# Patient Record
Sex: Male | Born: 1960 | Race: Black or African American | Hispanic: No | Marital: Single | Smoking: Never smoker
Health system: Southern US, Community
[De-identification: ages and names within clinical notes are randomized; demographics above are authoritative.]

## PROBLEM LIST (undated history)

## (undated) DIAGNOSIS — E785 Hyperlipidemia, unspecified: Secondary | ICD-10-CM

## (undated) DIAGNOSIS — E119 Type 2 diabetes mellitus without complications: Secondary | ICD-10-CM

## (undated) DIAGNOSIS — G35 Multiple sclerosis: Secondary | ICD-10-CM

## (undated) DIAGNOSIS — C61 Malignant neoplasm of prostate: Secondary | ICD-10-CM

## (undated) HISTORY — PX: HERNIA REPAIR: SHX51

## (undated) HISTORY — PX: PROSTATE BIOPSY: SHX241

## (undated) HISTORY — PX: OTHER SURGICAL HISTORY: SHX169

---

## 1999-06-24 ENCOUNTER — Emergency Department (HOSPITAL_COMMUNITY): Admission: EM | Admit: 1999-06-24 | Discharge: 1999-06-24 | Payer: Self-pay | Admitting: Emergency Medicine

## 1999-06-24 ENCOUNTER — Encounter: Payer: Self-pay | Admitting: Emergency Medicine

## 1999-07-14 ENCOUNTER — Emergency Department (HOSPITAL_COMMUNITY): Admission: EM | Admit: 1999-07-14 | Discharge: 1999-07-14 | Payer: Self-pay | Admitting: Emergency Medicine

## 1999-10-13 ENCOUNTER — Emergency Department (HOSPITAL_COMMUNITY): Admission: EM | Admit: 1999-10-13 | Discharge: 1999-10-13 | Payer: Self-pay | Admitting: Emergency Medicine

## 1999-11-12 ENCOUNTER — Encounter: Payer: Self-pay | Admitting: Neurology

## 1999-11-12 ENCOUNTER — Ambulatory Visit (HOSPITAL_COMMUNITY): Admission: RE | Admit: 1999-11-12 | Discharge: 1999-11-12 | Payer: Self-pay | Admitting: Neurology

## 2004-08-24 ENCOUNTER — Emergency Department (HOSPITAL_COMMUNITY): Admission: EM | Admit: 2004-08-24 | Discharge: 2004-08-24 | Payer: Self-pay | Admitting: Emergency Medicine

## 2006-02-03 ENCOUNTER — Emergency Department (HOSPITAL_COMMUNITY): Admission: EM | Admit: 2006-02-03 | Discharge: 2006-02-03 | Payer: Self-pay | Admitting: Emergency Medicine

## 2007-05-12 ENCOUNTER — Emergency Department (HOSPITAL_COMMUNITY): Admission: EM | Admit: 2007-05-12 | Discharge: 2007-05-12 | Payer: Self-pay | Admitting: Emergency Medicine

## 2007-12-01 ENCOUNTER — Emergency Department (HOSPITAL_COMMUNITY): Admission: EM | Admit: 2007-12-01 | Discharge: 2007-12-01 | Payer: Self-pay | Admitting: Emergency Medicine

## 2007-12-07 ENCOUNTER — Emergency Department (HOSPITAL_COMMUNITY): Admission: EM | Admit: 2007-12-07 | Discharge: 2007-12-07 | Payer: Self-pay | Admitting: Emergency Medicine

## 2009-06-23 ENCOUNTER — Emergency Department (HOSPITAL_COMMUNITY): Admission: EM | Admit: 2009-06-23 | Discharge: 2009-06-24 | Payer: Self-pay | Admitting: Emergency Medicine

## 2009-08-21 ENCOUNTER — Emergency Department (HOSPITAL_COMMUNITY): Admission: EM | Admit: 2009-08-21 | Discharge: 2009-08-21 | Payer: Self-pay | Admitting: Emergency Medicine

## 2010-10-10 ENCOUNTER — Emergency Department (HOSPITAL_COMMUNITY): Payer: BC Managed Care – PPO

## 2010-10-10 ENCOUNTER — Emergency Department (HOSPITAL_COMMUNITY)
Admission: EM | Admit: 2010-10-10 | Discharge: 2010-10-10 | Disposition: A | Discharge status: Home / Self Care | Payer: BC Managed Care – PPO | Attending: Emergency Medicine | Admitting: Emergency Medicine

## 2010-10-10 DIAGNOSIS — R29898 Other symptoms and signs involving the musculoskeletal system: Secondary | ICD-10-CM | POA: Insufficient documentation

## 2010-10-10 DIAGNOSIS — G35 Multiple sclerosis: Secondary | ICD-10-CM | POA: Insufficient documentation

## 2010-10-10 DIAGNOSIS — R29810 Facial weakness: Secondary | ICD-10-CM | POA: Insufficient documentation

## 2010-10-10 DIAGNOSIS — H538 Other visual disturbances: Secondary | ICD-10-CM | POA: Insufficient documentation

## 2010-10-10 LAB — BASIC METABOLIC PANEL
CO2: 29 mEq/L (ref 19–32)
Calcium: 9.8 mg/dL (ref 8.4–10.5)
GFR calc Af Amer: >60 mL/min (ref >60)
GFR calc non Af Amer: >60 mL/min (ref >60)
Sodium: 139 mEq/L (ref 135–145)

## 2010-10-10 LAB — DIFFERENTIAL
Basophils Absolute: 0.1 10*3/uL (ref 0.0–0.1)
Basophils Relative: 1 % (ref 0–1)
Monocytes Absolute: 0.9 10*3/uL (ref 0.1–1.0)
Neutro Abs: 8.3 10*3/uL — ABNORMAL HIGH (ref 1.7–7.7)
Neutrophils Relative %: 69 % (ref 43–77)

## 2010-10-10 LAB — CBC
Hemoglobin: 15.6 g/dL (ref 13.0–17.0)
MCHC: 33.5 g/dL (ref 30.0–36.0)

## 2010-11-07 LAB — POCT I-STAT, CHEM 8
BUN: 11 mg/dL (ref 6–23)
Calcium, Ion: 1.2 mmol/L (ref 1.12–1.32)
Chloride: 102 mEq/L (ref 96–112)
Glucose, Bld: 115 mg/dL — ABNORMAL HIGH (ref 70–99)

## 2010-11-07 LAB — DIFFERENTIAL
Lymphocytes Relative: 21 % (ref 12–46)
Lymphs Abs: 2.5 10*3/uL (ref 0.7–4.0)
Monocytes Absolute: 0.8 10*3/uL (ref 0.1–1.0)
Monocytes Relative: 7 % (ref 3–12)
Neutro Abs: 8.5 10*3/uL — ABNORMAL HIGH (ref 1.7–7.7)

## 2010-11-07 LAB — CBC
Hemoglobin: 15.2 g/dL (ref 13.0–17.0)
MCHC: 33.3 g/dL (ref 30.0–36.0)
RBC: 5.18 MIL/uL (ref 4.22–5.81)

## 2012-12-03 ENCOUNTER — Telehealth: Payer: Self-pay | Admitting: Neurology

## 2012-12-03 NOTE — Telephone Encounter (Signed)
This is a duplicate call.  Please see previous note.

## 2013-03-05 HISTORY — PX: HERNIA REPAIR: SHX51

## 2013-04-21 ENCOUNTER — Telehealth: Payer: Self-pay | Admitting: Neurology

## 2013-05-03 ENCOUNTER — Emergency Department (HOSPITAL_COMMUNITY): Payer: BC Managed Care – PPO

## 2013-05-03 ENCOUNTER — Encounter (HOSPITAL_COMMUNITY): Payer: Self-pay | Admitting: Family Medicine

## 2013-05-03 ENCOUNTER — Emergency Department (HOSPITAL_COMMUNITY)
Admission: EM | Admit: 2013-05-03 | Discharge: 2013-05-03 | Disposition: A | Discharge status: Home / Self Care | Payer: BC Managed Care – PPO | Attending: Emergency Medicine | Admitting: Emergency Medicine

## 2013-05-03 DIAGNOSIS — R5381 Other malaise: Secondary | ICD-10-CM | POA: Insufficient documentation

## 2013-05-03 DIAGNOSIS — Q674 Other congenital deformities of skull, face and jaw: Secondary | ICD-10-CM | POA: Insufficient documentation

## 2013-05-03 DIAGNOSIS — H538 Other visual disturbances: Secondary | ICD-10-CM | POA: Insufficient documentation

## 2013-05-03 DIAGNOSIS — R51 Headache: Secondary | ICD-10-CM | POA: Insufficient documentation

## 2013-05-03 DIAGNOSIS — G35 Multiple sclerosis: Secondary | ICD-10-CM | POA: Insufficient documentation

## 2013-05-03 HISTORY — DX: Multiple sclerosis: G35

## 2013-05-03 LAB — CBC WITH DIFFERENTIAL/PLATELET
Basophils Absolute: 0.1 10*3/uL (ref 0.0–0.1)
Basophils Relative: 1 % (ref 0–1)
Eosinophils Relative: 2 % (ref 0–5)
HCT: 47.1 % (ref 39.0–52.0)
Lymphocytes Relative: 21 % (ref 12–46)
MCHC: 33.3 g/dL (ref 30.0–36.0)
MCV: 85.3 fL (ref 78.0–100.0)
Monocytes Absolute: 0.5 10*3/uL (ref 0.1–1.0)
Monocytes Relative: 7 % (ref 3–12)
RDW: 14.1 % (ref 11.5–15.5)

## 2013-05-03 LAB — BASIC METABOLIC PANEL
BUN: 11 mg/dL (ref 6–23)
CO2: 24 mEq/L (ref 19–32)
Calcium: 9.5 mg/dL (ref 8.4–10.5)
Creatinine, Ser: 0.95 mg/dL (ref 0.50–1.35)

## 2013-05-03 MED ORDER — GADOBENATE DIMEGLUMINE 529 MG/ML IV SOLN
20.0000 mL | Freq: Once | INTRAVENOUS | Status: AC
  Administered 2013-05-03: 20 mL via INTRAVENOUS

## 2013-05-03 MED ORDER — PREDNISONE 20 MG PO TABS: ORAL_TABLET | ORAL | Status: DC

## 2013-05-03 MED ORDER — METHYLPREDNISOLONE SODIUM SUCC 125 MG IJ SOLR
125.0000 mg | Freq: Once | INTRAMUSCULAR | Status: AC
  Administered 2013-05-03: 125 mg via INTRAVENOUS
  Filled 2013-05-03: qty 2

## 2013-05-03 NOTE — ED Notes (Signed)
Pt requested and was given a coke/cup/ice.

## 2013-05-03 NOTE — ED Notes (Signed)
Patient transported to MRI 

## 2013-05-03 NOTE — ED Notes (Signed)
Per pt sts for the past couple of months he has been having an MS flare up. sts weakness, blurry vision, HA. Sts he needs a referral For neuro doctor.

## 2013-05-03 NOTE — ED Provider Notes (Signed)
CSN: [id number]     Arrival date & time 05/03/13  9147 History   First MD Initiated Contact with Patient 05/03/13 [phone]     Chief Complaint  Patient presents with  . Multiple Sclerosis   (Consider location/radiation/quality/duration/timing/severity/associated sxs/prior Treatment) HPI Comments: Pt presents with 4 months of inc fatigue, stiffness, blurry vision, and intermittent h/a's, described as similar symptoms to prior flares.  He has been without his Rebif since May, has not seen neurologist for about 1.5 years due to payment issues stemming from MRI charges.  Blurry vision constant for months, has also been told he needs glasses.  Symptoms unchanged, without exacerbating or alleviating symptoms. Symptoms helped in past by steroids.    Past Medical History  Diagnosis Date  . MS (multiple sclerosis)    Past Surgical History  Procedure Laterality Date  . Hernia repair     History reviewed. No pertinent family history. History  Substance Use Topics  . Smoking status: Never Smoker   . Smokeless tobacco: Not on file  . Alcohol Use: Yes    Review of Systems  Constitutional: Positive for fatigue. Negative for fever, activity change and appetite change.  HENT: Negative for congestion, facial swelling, rhinorrhea and trouble swallowing.   Eyes: Negative for photophobia and pain.       Blurry vision  Respiratory: Negative for cough, chest tightness and shortness of breath.   Cardiovascular: Negative for chest pain and leg swelling.  Gastrointestinal: Negative for nausea, vomiting, abdominal pain, diarrhea and constipation.  Endocrine: Negative for polydipsia and polyuria.  Genitourinary: Negative for dysuria, urgency, decreased urine volume and difficulty urinating.  Musculoskeletal: Negative for back pain and gait problem.  Skin: Negative for color change, rash and wound.  Allergic/Immunologic: Negative for immunocompromised state.  Neurological: Positive for facial asymmetry and  headaches. Negative for dizziness, seizures, syncope, speech difficulty, weakness, light-headedness and numbness.  Psychiatric/Behavioral: Negative for confusion, decreased concentration and agitation.    Allergies  Review of patient's allergies indicates no known allergies.  Home Medications   Current Outpatient Rx  Name  Route  Sig  Dispense  Refill  . predniSONE (DELTASONE) 20 MG tablet      3 tabs po day one, then 2 tabs daily x 4 days   11 tablet   0    BP 128/82  Pulse 85  Temp(Src) 97.5 F (36.4 C) (Oral)  Resp 18  Ht 5\' 8"  (1.727 m)  Wt 205 lb (92.987 kg)  BMI 31.18 kg/m2  SpO2 97% Physical Exam  Constitutional: He is oriented to person, place, and time. He appears well-developed and well-nourished. No distress.  HENT:  Head: Normocephalic and atraumatic.  Mouth/Throat: No oropharyngeal exudate.  Eyes: Pupils are equal, round, and reactive to light.  Neck: Normal range of motion. Neck supple.  Cardiovascular: Normal rate, regular rhythm and normal heart sounds.  Exam reveals no gallop and no friction rub.   No murmur heard. Pulmonary/Chest: Effort normal and breath sounds normal. No respiratory distress. He has no wheezes. He has no rales.  Abdominal: Soft. Bowel sounds are normal. He exhibits no distension and no mass. There is no tenderness. There is no rebound and no guarding.  Musculoskeletal: Normal range of motion. He exhibits no edema and no tenderness.  Neurological: He is alert and oriented to person, place, and time. He displays no tremor. A cranial nerve deficit is present. No sensory deficit. He exhibits normal muscle tone. Coordination and gait normal.  Slight R sided facial droop  Skin: Skin is warm and dry.  Psychiatric: He has a normal mood and affect.    ED Course  Procedures (including critical care time) Labs Review Labs Reviewed  BASIC METABOLIC PANEL - Abnormal; Notable for the following:    Glucose, Bld 103 (*)    All other components  within normal limits  CBC WITH DIFFERENTIAL   Imaging Review Ct Head Wo Contrast  05/03/2013   CLINICAL DATA:  Blurry vision and headache; history of multiple sclerosis  EXAM: CT HEAD WITHOUT CONTRAST  TECHNIQUE: Contiguous axial images were obtained from the base of the skull through the vertex without intravenous contrast. Study was obtained within 24 hr of patient's arrival at the emergency department.  COMPARISON:  October 10, 2010  FINDINGS: Ventricles are normal in size and configuration. There is no demonstrable mass, hemorrhage, extra-axial fluid collection, or midline shift.  There is a new focus of decreased attenuation just inferior to the right the proximal middle cerebral artery at the junction of the right subfrontal and medial temporal lobes. This focal area of new decreased attenuation measures 1.5 x 1.0 cm. There is patchy small decreased attenuation throughout portions of the centra semiovale bilaterally which are stable. No new gray-white compartment lesions in these areas are seen. No other new gray-white compartment lesions are identified.  Bony calvarium appears intact. The mastoid air cells are clear.  IMPRESSION: New decreased attenuation at the right subfrontal -anterior temporal junction, consistent with either recent infarct or possibly new focus of demyelination given the history. The decreased attenuation in the periventricular small vessel disease is stable compared to prior study and may represent demyelination, small-vessel disease, or a combination of both. There is no well-defined hemorrhage or mass effect.   Electronically Signed   By: Bretta Bang   On: 05/03/2013 12:55   Mr Brain W Wo Contrast  05/03/2013   CLINICAL DATA:  Multiple sclerosis. Visual difficulties and dizziness. Right-sided weakness. Recent hernia surgery.  EXAM: MRI HEAD WITHOUT AND WITH CONTRAST  TECHNIQUE: Multiplanar, multiecho pulse sequences of the brain and surrounding structures were obtained  according to standard protocol without and with intravenous contrast  CONTRAST:  20mL MULTIHANCE GADOBENATE DIMEGLUMINE 529 MG/ML IV SOLN  COMPARISON:  CT without contrast 05/03/2013 and 10/09/2005.  FINDINGS: Extensive periventricular and subcortical T2 hyperintensities are consistent with the given diagnosis of multiple sclerosis. There is focal thinning of the posterior body of the corpus callosum. Much of the diffusion trace signal is secondary to T2 shine through. However, there is some restricted diffusion involving the splenium of the corpus callosum, worse on the left. No other restricted diffusion is evident. Decreased marrow signal is present in the upper cervical spine. Flow is present in the major intracranial arteries. The globes and orbits are intact. The paranasal sinuses and mastoid air cells are clear.  IMPRESSION: 1. Extensive periventricular and subcortical T2 hyperintensities in patterns consistent with the given diagnosis of multiple sclerosis. 2. Focal restricted diffusion within the splenium of the corpus callosum is concerning for active demyelination. 3. Based on prior CT exams, there is some progression of disease, certainly since 2006. 4. Decreased marrow signal within the upper cervical spine. This is nonspecific, as the patient does not appear to be anemic in prior laboratory testing. This could be related to prior therapy.   Electronically Signed   By: Gennette Pac   On: 05/03/2013 18:03    MDM   1. Multiple sclerosis exacerbation    Pt is a 52 y.o.  male with Pmhx as above who presents with concern for MS flare for about 4 months w/ inc fatigue, stiffness, blurry vision, and intermittent h/a's, described as similar symptoms to prior flares.  He has been without his Rebif since May, has not seen neurologist for about 1.5 years due to payment issues stemming from MRI charges.  Blurry vision constant for months, has also been told he needs glasses. On PE, VSS, pt in NAD.  He has  slight R sided facial droop which he says he has had before with MS flare.  Neuro exam otherwise unremarkable.   1:28 PM Have spoken to Neurology about pt's CT findings (New decreased attenuation at the right subfrontal -anterior temporal junction, consistent with either recent infarct or possibly new focus of demyelination given the history), they suggest MRI w/ contrast.   18:20 PM MRI with worsening/active disease.  Spoke with Dr. Thad Ranger w/ neurology who feels he would benefit from IV solumdrol in hospital.  Have ordered 125IV solumedrol.   18:30 PM Pt doesn't not want to stay in hospital as he is recently back to work and states he needs the income. I believe he has capacity to make this decision and will be safe for discharge.  Will d/c home w/ referral to [facility] neurology and PO prednisone.  Return precautions given for new or worsening symptoms   1. Multiple sclerosis exacerbation         Shanna Cisco, MD 05/04/13 1227

## 2013-05-04 ENCOUNTER — Telehealth (HOSPITAL_COMMUNITY): Payer: Self-pay | Admitting: Emergency Medicine

## 2013-05-04 NOTE — ED Notes (Signed)
Called [place] to see what they needed from [place] for pts referral to [facility] Neurology and they said they've taken care of it.

## 2013-05-04 NOTE — ED Notes (Signed)
Pt calling about referral to Neurology.  No note from ED MD (in EPIC or scanned) and no referral info listed on pts AVS in EPIC.  I informed pt I cant make referral without either MD note or AVS indicating it.  Pt verbalized understanding.

## 2013-05-12 ENCOUNTER — Encounter: Payer: Self-pay | Admitting: Neurology

## 2013-05-12 ENCOUNTER — Ambulatory Visit (INDEPENDENT_AMBULATORY_CARE_PROVIDER_SITE_OTHER): Payer: BC Managed Care – PPO | Admitting: Neurology

## 2013-05-12 VITALS — BP 118/80 | HR 60 | Temp 98.1°F | Resp 14 | Wt 205.0 lb

## 2013-05-12 DIAGNOSIS — G35 Multiple sclerosis: Secondary | ICD-10-CM

## 2013-05-12 NOTE — Patient Instructions (Signed)
Your MRI is scheduled at [facility] on Thursday, August 12, 2013 at 2:00 pm. Please check in at the first floor radiology department 15 minutes prior to your scheduled appointment time. Enter the hospital off of [address] at [address].     [phone].  Follow up with Dr. Everlena Cooper after the MRI (around the 15th of Jan.).  I will investigate getting your Rebif restarted.

## 2013-05-12 NOTE — Progress Notes (Signed)
Devon Barron  NEUROLOGY CONSULTATION NOTE  Devon Barron MRN: [id number] DOB: 01/17/1961  Referring provider: ED  Reason for consult:  Multiple sclerosis  HISTORY OF PRESENT ILLNESS: Devon Barron is a 52 year old right-handed man with possible OSA, who presents for multiple sclerosis.  Records and images were personally reviewed where available.   He was diagnosed with MS in 2001.  At that time, he presented with left facial myokymia.  He reports that he hasn't had many flare ups, and only a couple of flare-ups requiring pulse steroids.  They typically present as blurred vision, headache, dizziness, unsteadiness and fatigue.  He has both supratentorial and infratentorial demyelinating lesions.  His cervical cord is okay.  He has a history of lapses with physician follow-ups and non-adherence to disease-modifying agents, mostly due to financial reasons.  He previously was on Avonex, and then Rebif.  In the past, neutralizing antibodies have been negative.  Due to financial reasons, he has not followed up with a neurologist in 1.5 years and had not taken his Rebif since May.  Since May, he developed increased fatigue, stiffness, blurred vision and intermittent headaches, similar to prior flare ups.  He presented to the ED on 05/03/13.  Physical exam revealed slight right sided facial droop.  Initially, a CT of the head was performed and revealed decreased attenuation at the right subfrontal-anterior temporal junction.  MRI Brain w/wo contrast was performed, and revealed extensive periventricular and subcortical T2 hyperintensities, as well as focal restricted diffusion within the splenium of the corpus callosum, concerning for active demyelination.  It was recommended that he be admitted for IV steroids, but patient declined.  He was discharged on prednisone taper.  Since finishing the prednisone taper, he feels his walking has improved.  PAST MEDICAL HISTORY: Past Medical History  Diagnosis Date  . MS  (multiple sclerosis)     PAST SURGICAL HISTORY: Past Surgical History  Procedure Laterality Date  . Hernia repair      MEDICATIONS: Current Outpatient Prescriptions on File Prior to Visit  Medication Sig Dispense Refill  . predniSONE (DELTASONE) 20 MG tablet 3 tabs po day one, then 2 tabs daily x 4 days  11 tablet  0   No current facility-administered medications on file prior to visit.    ALLERGIES: No Known Allergies  FAMILY HISTORY: History reviewed. No pertinent family history.  SOCIAL HISTORY: History   Social History  . Marital Status: Single    Spouse Name: N/A    Number of Children: N/A  . Years of Education: N/A   Occupational History  . Not on file.   Social History Main Topics  . Smoking status: Never Smoker   . Smokeless tobacco: Not on file  . Alcohol Use: Yes     Comment: Occ  . Drug Use: Not on file  . Sexual Activity: Not on file   Other Topics Concern  . Not on file   Social History Narrative  . No narrative on file    REVIEW OF SYSTEMS: Constitutional: No fevers, chills, or sweats, no generalized fatigue, change in appetite Eyes: No visual changes, double vision, eye pain Ear, nose and throat: No hearing loss, ear pain, nasal congestion, sore throat Cardiovascular: No chest pain, palpitations Respiratory:  No shortness of breath at rest or with exertion, wheezes GastrointestinaI: No nausea, vomiting, diarrhea, abdominal pain, fecal incontinence Genitourinary:  No dysuria, urinary retention or frequency Musculoskeletal:  No neck pain, back pain Integumentary: No rash, pruritus, skin lesions Neurological: as  above Psychiatric: No depression, insomnia, anxiety Endocrine: No palpitations, fatigue, diaphoresis, mood swings, change in appetite, change in weight, increased thirst Hematologic/Lymphatic:  No anemia, purpura, petechiae. Allergic/Immunologic: no itchy/runny eyes, nasal congestion, recent allergic reactions, rashes  PHYSICAL  EXAM: Filed Vitals:   05/12/13 1354  BP: 118/80  Pulse: 60  Temp: 98.1 F (36.7 C)  Resp: 14   General: No acute distress Head:  Normocephalic/atraumatic Neck: supple, no paraspinal tenderness, full range of motion Back: No paraspinal tenderness Heart: regular rate and rhythm Lungs: Clear to auscultation bilaterally. Vascular: No carotid bruits. Neurological Exam: Mental status: alert and oriented to person, place, and time, speech fluent and not dysarthric, language intact. Cranial nerves: CN I: not tested CN II: pupils equal, round and reactive to light, visual fields intact, fundi unremarkable. CN III, IV, VI:  full range of motion, no nystagmus, no ptosis CN V: facial sensation intact CN VII: right lower facial weakness CN VIII: hearing intact CN IX, X: gag intact, uvula midline CN XI: sternocleidomastoid and trapezius muscles intact CN XII: tongue midline Bulk & Tone: normal, no fasciculations. Motor: 5-/5 left triceps, otherwise 5/5. Sensation: temperature and vibration intact. Deep Tendon Reflexes: 2+ throughout, toes down Finger to nose testing: normal Heel to shin: normal Gait: normal stride, able to walk on toes and heels.  Some mild difficulty with tandem. Romberg with sway.  IMPRESSION: Relapsing-remitting multiple sclerosis  PLAN: 1.  Will get him back on Rebif 2.  Will check vitamin D level 3.  Repeat MRI in 3 months with follow up soon after  Thank you for allowing me to take part in the care of this patient.  Shon Millet, DO

## 2013-05-13 ENCOUNTER — Telehealth: Payer: Self-pay | Admitting: Neurology

## 2013-05-13 LAB — VITAMIN D 25 HYDROXY (VIT D DEFICIENCY, FRACTURES): Vit D, 25-Hydroxy: 18 ng/mL — ABNORMAL LOW (ref 30–89)

## 2013-05-13 NOTE — Telephone Encounter (Signed)
Message copied by Benay Spice on Thu May 13, 2013  1:27 PM ------      Message from: JAFFE, ADAM R      Created: Thu May 13, 2013  5:52 AM       Mr. Denardo vitamin D level is low.  I recommend starting D3 4000 IU daily.  Also, it would be helpful to spend approximately 20-30 minutes in the sun daily.      ----- Message -----         From: [facility]         Sent: 05/13/2013   2:42 AM           To: Cira Servant, DO                   ------

## 2013-05-13 NOTE — Telephone Encounter (Signed)
Spoke with Hessie Diener. Information given as per Dr. Everlena Cooper. Will start the OTC Vit D3. Aware that I will fax in the script to [facility] for his Rebif. No additional questions/concerns at this time.

## 2013-07-13 ENCOUNTER — Encounter (HOSPITAL_COMMUNITY): Payer: Self-pay | Admitting: Emergency Medicine

## 2013-07-13 ENCOUNTER — Emergency Department (HOSPITAL_COMMUNITY): Payer: BC Managed Care – PPO

## 2013-07-13 ENCOUNTER — Other Ambulatory Visit: Payer: Self-pay

## 2013-07-13 ENCOUNTER — Emergency Department (HOSPITAL_COMMUNITY)
Admission: EM | Admit: 2013-07-13 | Discharge: 2013-07-14 | Disposition: A | Discharge status: Home / Self Care | Payer: BC Managed Care – PPO | Attending: Emergency Medicine | Admitting: Emergency Medicine

## 2013-07-13 DIAGNOSIS — F29 Unspecified psychosis not due to a substance or known physiological condition: Secondary | ICD-10-CM | POA: Insufficient documentation

## 2013-07-13 DIAGNOSIS — R51 Headache: Secondary | ICD-10-CM | POA: Insufficient documentation

## 2013-07-13 DIAGNOSIS — R42 Dizziness and giddiness: Secondary | ICD-10-CM | POA: Insufficient documentation

## 2013-07-13 DIAGNOSIS — H9209 Otalgia, unspecified ear: Secondary | ICD-10-CM | POA: Insufficient documentation

## 2013-07-13 DIAGNOSIS — R110 Nausea: Secondary | ICD-10-CM | POA: Insufficient documentation

## 2013-07-13 DIAGNOSIS — R197 Diarrhea, unspecified: Secondary | ICD-10-CM | POA: Insufficient documentation

## 2013-07-13 DIAGNOSIS — G35 Multiple sclerosis: Secondary | ICD-10-CM | POA: Insufficient documentation

## 2013-07-13 LAB — CBC WITH DIFFERENTIAL/PLATELET
Basophils Absolute: 0 10*3/uL (ref 0.0–0.1)
Basophils Relative: 0 % (ref 0–1)
Eosinophils Absolute: 0.1 10*3/uL (ref 0.0–0.7)
Eosinophils Relative: 1 % (ref 0–5)
HCT: 45.4 % (ref 39.0–52.0)
MCH: 28.6 pg (ref 26.0–34.0)
MCHC: 33.3 g/dL (ref 30.0–36.0)
Monocytes Absolute: 1.2 10*3/uL — ABNORMAL HIGH (ref 0.1–1.0)
Monocytes Relative: 15 % — ABNORMAL HIGH (ref 3–12)
Neutro Abs: 5 10*3/uL (ref 1.7–7.7)
RDW: 14.1 % (ref 11.5–15.5)

## 2013-07-13 LAB — COMPREHENSIVE METABOLIC PANEL
AST: 27 U/L (ref 0–37)
Albumin: 4.1 g/dL (ref 3.5–5.2)
BUN: 7 mg/dL (ref 6–23)
Calcium: 9.3 mg/dL (ref 8.4–10.5)
Chloride: 105 mEq/L (ref 96–112)
Creatinine, Ser: 1.05 mg/dL (ref 0.50–1.35)
Total Protein: 7.9 g/dL (ref 6.0–8.3)

## 2013-07-13 MED ORDER — GADOBENATE DIMEGLUMINE 529 MG/ML IV SOLN
20.0000 mL | Freq: Once | INTRAVENOUS | Status: AC
  Administered 2013-07-13: 20 mL via INTRAVENOUS

## 2013-07-13 NOTE — ED Notes (Signed)
Pt c/o bilateral ear pain and head congestion; pt c/o vague abd burning from nasal drainage; pt sts hx of MS but does not feel like flare; pt sts some dizziness

## 2013-07-13 NOTE — ED Notes (Addendum)
Pt states that he has been getting dizzy off and on since Monday night. Pt states that yesterday at work he became so dizzy that he became disoriented. Pt states for the past week his stomach has been feeling sour, he has had pressure in his ears and he has had a head cold. Pt states that he falls asleep often with his earphones in while listening to loud music and is concerned for an infection.

## 2013-07-13 NOTE — ED Notes (Signed)
Patient told nurse he just lost his mother last month and is now having to take time off of work that he really can't afford to. RN gave patient emotional support, therapeutic conversation and encouragement and offered to provide patient with a list of resources if he needed to speak with someone.

## 2013-07-13 NOTE — ED Provider Notes (Signed)
CSN: [id number]     Arrival date & time 07/13/13  1821 History   First MD Initiated Contact with Patient 07/13/13 2014     Chief Complaint  Patient presents with  . Nasal Congestion  . Otalgia    HPI  Devon Barron is a 52 y.o. male with a PMH of MS who presents to the ED for evaluation of multiple complaints.  History was provided by the patient.  Patient states that he has had bilateral otalgia, fatigue, sinus pressure, dizziness, nasal congestion, headache, and nausea off and on for the past week.  He describes his dizziness as the room spinning and denies lightheadedness.  He states he has been getting episodes intermittently sometimes with turning his head suddenly or when going from a sitting to standing position.  He also has dizziness at rest.  He had one episode of dizziness on Monday where he was unsteady on his feet and had confusion.  He has mild dizziness currently.  He has a mild occipital headache.  He states he has been falling asleep with his headphones at night this week, which he thinks is the cause of his problems.  He also complains of nausea with no abdominal pain.   He states he just has a "sour stomach."  He has had flare-ups with his MS in the past, but his symptoms today are not similar.  He denies any hx of vertigo diagnoses in the past.  He had a few episodes of diarrhea however this has resolved.  He denies any fevers, neck pain, chest pain, SOB, emesis, constipation, dysuria, weakness, loss of sensation, slurred speech, ongoing ataxia, confusion, or syncope.  No known sick contacts.  He is taking his Rebif with no missed doses.  His neurologist is Dr. Everlena Cooper with [facility] neurology.  He has a follow-up MRI scheduled for the beginning of January.      Past Medical History  Diagnosis Date  . MS (multiple sclerosis)    Past Surgical History  Procedure Laterality Date  . Hernia repair     Family History  Problem Relation Age of Onset  . Ataxia Neg Hx   . Chorea Neg  Hx   . Dementia Neg Hx   . Mental retardation Neg Hx   . Migraines Neg Hx   . Multiple sclerosis Neg Hx   . Neurofibromatosis Neg Hx   . Neuropathy Neg Hx   . Parkinsonism Neg Hx   . Seizures Neg Hx   . Stroke Neg Hx    History  Substance Use Topics  . Smoking status: Never Smoker   . Smokeless tobacco: Not on file  . Alcohol Use: Yes     Comment: Occ    Review of Systems  Constitutional: Positive for fatigue. Negative for fever, chills, diaphoresis, activity change and appetite change.  HENT: Positive for congestion, ear pain and sinus pressure. Negative for ear discharge, mouth sores, rhinorrhea and sore throat.   Eyes: Negative for photophobia and visual disturbance.  Respiratory: Negative for cough, shortness of breath and wheezing.   Cardiovascular: Negative for chest pain and leg swelling.  Gastrointestinal: Positive for nausea and diarrhea. Negative for vomiting and abdominal pain.  Genitourinary: Negative for dysuria.  Musculoskeletal: Negative for back pain, myalgias and neck pain.  Neurological: Positive for dizziness and headaches. Negative for syncope, speech difficulty, weakness, light-headedness and numbness.  Psychiatric/Behavioral: Positive for confusion.    Allergies  Review of patient's allergies indicates no known allergies.  Home Medications  Current Outpatient Rx  Name  Route  Sig  Dispense  Refill  . interferon beta-1a (REBIF) 44 MCG/0.5ML injection   Subcutaneous   Inject 44 mcg into the skin 3 (three) times a week.          BP 137/92  Pulse 110  Temp(Src) 97.9 F (36.6 C) (Oral)  Resp 18  SpO2 99%  Filed Vitals:   07/13/13 1838 07/13/13 2152 07/14/13 0324 07/14/13 0424  BP: 137/92 135/91 127/79 130/78  Pulse: 110 84 81 80  Temp: 97.9 F (36.6 C)     TempSrc: Oral     Resp: 18 20 19 16   SpO2: 99% 98% 99% 99%    Physical Exam  Nursing note and vitals reviewed. Constitutional: He is oriented to person, place, and time. He appears  well-developed and well-nourished. No distress.  HENT:  Head: Normocephalic and atraumatic.  Right Ear: External ear normal.  Left Ear: External ear normal.  Nose: Nose normal.  Mouth/Throat: Oropharynx is clear and moist. No oropharyngeal exudate.  No sinus or scalp tenderness.  TM's gray and translucent bilaterally  Eyes: Conjunctivae and EOM are normal. Pupils are equal, round, and reactive to light. Right eye exhibits no discharge. Left eye exhibits no discharge.  No nystagmus  Neck: Normal range of motion. Neck supple.  Dizziness not increased with head movement  Cardiovascular: Normal rate, regular rhythm, normal heart sounds and intact distal pulses.  Exam reveals no gallop and no friction rub.   No murmur heard. Dorsalis pedis pulses present and equal bilaterally  Pulmonary/Chest: Effort normal and breath sounds normal. No respiratory distress. He has no wheezes. He has no rales. He exhibits no tenderness.  Abdominal: Soft. Bowel sounds are normal. He exhibits no distension and no mass. There is no tenderness. There is no rebound and no guarding.  Musculoskeletal: Normal range of motion. He exhibits no edema and no tenderness.  Strength 5/5 in the upper and lower extremities bilaterally.  Patient able to ambulate without difficulty or ataxia.  Patient reports increased dizziness with going from a sitting to standing position.    Neurological: He is alert and oriented to person, place, and time.  GCS 15.  No focal neurological deficits.  Minimal left sided facial droop (baseline).  No pronator drift.  Finger to nose intact.  Heel to shin intact.    Skin: Skin is warm and dry. He is not diaphoretic.    ED Course  Procedures (including critical care time) Labs Review Labs Reviewed - No data to display Imaging Review No results found.  EKG Interpretation   None       Date: 07/14/2013  Rate: 76  Rhythm: normal sinus rhythm  QRS Axis: normal  Intervals: normal  ST/T Wave  abnormalities: normal  Conduction Disutrbances:none  Narrative Interpretation:   Old EKG Reviewed: none available       MR Brain W Wo Contrast (Final result)  Result time: 07/13/13 23:55:09    Final result by Rad Results In Interface (07/13/13 23:55:09)    Narrative:   CLINICAL DATA: Multiple sclerosis, nasal congestion and sinus pressure, fatigue, otalgia and dizziness. Mild occipital headache.  EXAM: MRI HEAD WITHOUT AND WITH CONTRAST  TECHNIQUE: Multiplanar, multiecho pulse sequences of the brain and surrounding structures were obtained without and with intravenous contrast.  CONTRAST: 20mL MULTIHANCE GADOBENATE DIMEGLUMINE 529 MG/ML IV SOLN  COMPARISON: MRI of the brain May 03, 2013  FINDINGS: At least 6 infratentorial and greater than 10 supratentorial ovoid T2 hyperintensities  many of which radiates from the right frontal periventricular margin most consistent with areas of demyelination, similar to prior examination. The ventricles and sulci are overall normal for patient's age. Confluent periventricular white matter FLAIR T2 hyperintensities extending the right parietal lobe, are similar. The lesions demonstrate low T1 signal consistent with "Black hole" of demyelination. Pulsation artifact to the temporal lobes, with faintly enhancing new left parietal 7 mm lesion, post gadolinium imaged 29/30 of 46. No reduced diffusion to suggest acute ischemia nor hyperacute demyelination. No susceptibility artifact to suggest hemorrhage.  No abnormal extra-axial fluid collections, leptomeningeal enhancement nor extra-axial masses. Normal major intracranial vascular flow voids seen at the skull base.  No abnormal sellar expansion. Trace maxillary mucosal thickening without paranasal sinus air-fluid levels, mastoid air cells appear well-aerated. No suspicious calvarial bone marrow signal. Craniocervical junction maintained.  IMPRESSION: Single new 7 mm left parietal  enhancing white matter lesion consistent with acute demyelination and disease progression in a background of severe chronic demyelination. No advanced parenchymal brain volume loss for age.   Electronically Signed By: Awilda Metro On: 07/13/2013 23:55         MDM   Devon Barron is a 52 y.o. male with a PMH of MS who presents to the ED for evaluation of multiple complaints  Rechecks  10:00 PM = Informed patient of plan per neurology. He is in agreement with MRI. 2:45 AM = spoke with patient about results of MRI scan. Patient states he's doing well with no dizziness. No other complaints states he is ready to be discharged.   Consults  9:45 PM = Spoke with Dr. Everlena Cooper with neurology who states that we can proceed with the MRI of the brain earlier than scheduled (early January) to determine the cause of his dizziness.    2:37 AM = Spoke with Dr. Everlena Cooper who states to start 1 gram solumedrol.     Patient evaluated in emergency department for multiple complaints. Patient complained of dizziness off and on during the week. Neurology was consulted who recommended MRI which was performed in emergency department. MRI was negative for any acute ischemia or hemorrhage however did show acute demyelination and progression of patient's multiple sclerosis. This may explain the patient's symptoms vs positional vertigo. Patient's dizziness resolved throughout his emergency room visit. Patient was able to ambulate without difficulty or ataxia. Patient had minimal left-sided facial drooping, which has been documented on previous neurology and ED notes.  Patient remained in no acute distress throughout his ED visit.  Patient will be contacted by neurology tomorrow for further evaluation and management.  Return precautions were discussed.       Discharge Medication List as of 07/14/2013  4:17 AM      Final impressions: 1. Multiple sclerosis   2. Dizziness      Luiz Iron PA-C   This  patient was discussed with Dr. Vinetta Bergamo, PA-C 07/14/13 [phone]

## 2013-07-14 ENCOUNTER — Telehealth: Payer: Self-pay | Admitting: Neurology

## 2013-07-14 ENCOUNTER — Other Ambulatory Visit: Payer: Self-pay | Admitting: Neurology

## 2013-07-14 ENCOUNTER — Other Ambulatory Visit (HOSPITAL_COMMUNITY): Payer: Self-pay | Admitting: *Deleted

## 2013-07-14 MED ORDER — SODIUM CHLORIDE 0.9 % IV SOLN
1000.0000 mg | INTRAVENOUS | Status: AC
  Administered 2013-07-14: 1000 mg via INTRAVENOUS
  Filled 2013-07-14 (×2): qty 8

## 2013-07-14 MED ORDER — METHYLPREDNISOLONE SODIUM SUCC 1000 MG IJ SOLR: INTRAMUSCULAR | Status: DC

## 2013-07-14 NOTE — ED Provider Notes (Signed)
  Medical screening examination/treatment/procedure(s) were performed by non-physician practitioner and as supervising physician I was immediately available for consultation/collaboration.  EKG Interpretation   None          Jahmier Willadsen, MD 07/14/13 1857 

## 2013-07-14 NOTE — Telephone Encounter (Signed)
Message copied by Benay Spice on Wed Jul 14, 2013  8:23 AM ------      Message from: JAFFE, ADAM R      Created: Wed Jul 14, 2013  6:10 AM       Mr. Voong, who has MS, was seen in the ED last night for dizziness.  MRI of brain with and without contrast was performed and revealed a single new 7 mm left parietal enhancing white matter lesion      consistent with acute demyelination.  He received 1gm Solumedrol earlier this morning.  I would like to schedule for him two more rounds of Solumedrol 1gm tomorrow morning (Thursday) and Friday morning.  I would like to add him at 11:30 tomorrow for follow up to discuss the dizziness. ------

## 2013-07-14 NOTE — Telephone Encounter (Signed)
Spoke with Hessie Diener. Aware of appointments at the [facility] Short Stay on 12/11 at 11:00 and 12/12 at 9:00. I will let him know about a f/u appointment.

## 2013-07-14 NOTE — Telephone Encounter (Signed)
Left a message for the patient to return my call re: IV infusions at [facility] Short Stay on 12/11 at 11:00am and on 12/12 at 9:00am.

## 2013-07-14 NOTE — Telephone Encounter (Signed)
Patient aware of f/u appointment with Dr. Everlena Cooper at noon on Friday.

## 2013-07-15 ENCOUNTER — Encounter (HOSPITAL_COMMUNITY)
Admission: RE | Admit: 2013-07-15 | Discharge: 2013-07-15 | Disposition: A | Discharge status: Home / Self Care | Payer: BC Managed Care – PPO | Source: Ambulatory Visit | Attending: Neurology | Admitting: Neurology

## 2013-07-15 DIAGNOSIS — G35 Multiple sclerosis: Secondary | ICD-10-CM | POA: Insufficient documentation

## 2013-07-15 MED ORDER — SODIUM CHLORIDE 0.9 % IV SOLN
1000.0000 mg | INTRAVENOUS | Status: DC
  Administered 2013-07-15: 1000 mg via INTRAVENOUS
  Filled 2013-07-15: qty 8

## 2013-07-16 ENCOUNTER — Encounter (HOSPITAL_COMMUNITY)
Admission: RE | Admit: 2013-07-16 | Discharge: 2013-07-16 | Disposition: A | Discharge status: Home / Self Care | Payer: BC Managed Care – PPO | Source: Ambulatory Visit | Attending: Neurology | Admitting: Neurology

## 2013-07-16 ENCOUNTER — Telehealth: Payer: Self-pay | Admitting: Neurology

## 2013-07-16 ENCOUNTER — Ambulatory Visit: Payer: BC Managed Care – PPO | Admitting: Neurology

## 2013-07-16 MED ORDER — SODIUM CHLORIDE 0.9 % IV SOLN
1000.0000 mg | INTRAVENOUS | Status: DC
  Administered 2013-07-16: 10:00:00 1000 mg via INTRAVENOUS
  Filled 2013-07-16: qty 8

## 2013-07-16 NOTE — Telephone Encounter (Signed)
[name] showed today's appt. No show letter sent / Roanna Raider

## 2013-07-23 ENCOUNTER — Encounter (HOSPITAL_COMMUNITY): Payer: Self-pay | Admitting: Emergency Medicine

## 2013-07-23 ENCOUNTER — Emergency Department (HOSPITAL_COMMUNITY): Payer: BC Managed Care – PPO

## 2013-07-23 ENCOUNTER — Emergency Department (HOSPITAL_COMMUNITY)
Admission: EM | Admit: 2013-07-23 | Discharge: 2013-07-23 | Disposition: A | Discharge status: Home / Self Care | Payer: BC Managed Care – PPO | Attending: Emergency Medicine | Admitting: Emergency Medicine

## 2013-07-23 DIAGNOSIS — R110 Nausea: Secondary | ICD-10-CM | POA: Insufficient documentation

## 2013-07-23 DIAGNOSIS — R42 Dizziness and giddiness: Secondary | ICD-10-CM | POA: Insufficient documentation

## 2013-07-23 DIAGNOSIS — Z79899 Other long term (current) drug therapy: Secondary | ICD-10-CM | POA: Insufficient documentation

## 2013-07-23 DIAGNOSIS — H8110 Benign paroxysmal vertigo, unspecified ear: Secondary | ICD-10-CM | POA: Insufficient documentation

## 2013-07-23 DIAGNOSIS — R0789 Other chest pain: Secondary | ICD-10-CM | POA: Insufficient documentation

## 2013-07-23 DIAGNOSIS — H9209 Otalgia, unspecified ear: Secondary | ICD-10-CM | POA: Insufficient documentation

## 2013-07-23 LAB — CBC WITH DIFFERENTIAL/PLATELET
Basophils Absolute: 0 10*3/uL (ref 0.0–0.1)
HCT: 44.5 % (ref 39.0–52.0)
Hemoglobin: 15.1 g/dL (ref 13.0–17.0)
Lymphocytes Relative: 9 % — ABNORMAL LOW (ref 12–46)
Lymphs Abs: 0.8 10*3/uL (ref 0.7–4.0)
Monocytes Absolute: 1.1 10*3/uL — ABNORMAL HIGH (ref 0.1–1.0)
Monocytes Relative: 13 % — ABNORMAL HIGH (ref 3–12)
Neutro Abs: 6.3 10*3/uL (ref 1.7–7.7)
Neutrophils Relative %: 77 % (ref 43–77)
RBC: 5.24 MIL/uL (ref 4.22–5.81)
RDW: 14 % (ref 11.5–15.5)
WBC: 8.1 10*3/uL (ref 4.0–10.5)

## 2013-07-23 LAB — COMPREHENSIVE METABOLIC PANEL
ALT: 23 U/L (ref 0–53)
AST: 16 U/L (ref 0–37)
CO2: 27 mEq/L (ref 19–32)
Chloride: 99 mEq/L (ref 96–112)
Creatinine, Ser: 0.79 mg/dL (ref 0.50–1.35)
GFR calc non Af Amer: >90 mL/min (ref >90)
Sodium: 136 mEq/L (ref 135–145)
Total Bilirubin: 0.5 mg/dL (ref 0.3–1.2)

## 2013-07-23 LAB — URINALYSIS, ROUTINE W REFLEX MICROSCOPIC
Bilirubin Urine: NEGATIVE
Hgb urine dipstick: NEGATIVE
Protein, ur: NEGATIVE mg/dL
Urobilinogen, UA: 0.2 mg/dL (ref 0.0–1.0)

## 2013-07-23 LAB — POCT I-STAT TROPONIN I: Troponin i, poc: 0 ng/mL (ref 0.00–0.08)

## 2013-07-23 MED ORDER — MECLIZINE HCL 50 MG PO TABS: 50.0000 mg | ORAL_TABLET | Freq: Three times a day (TID) | ORAL | Status: DC | PRN

## 2013-07-23 MED ORDER — ALPRAZOLAM 0.5 MG PO TABS: 0.5000 mg | ORAL_TABLET | Freq: Two times a day (BID) | ORAL | Status: DC | PRN

## 2013-07-23 MED ORDER — ALPRAZOLAM 0.25 MG PO TABS
0.5000 mg | ORAL_TABLET | Freq: Once | ORAL | Status: AC
  Administered 2013-07-23: 0.5 mg via ORAL
  Filled 2013-07-23: qty 2

## 2013-07-23 MED ORDER — MECLIZINE HCL 25 MG PO TABS
25.0000 mg | ORAL_TABLET | Freq: Once | ORAL | Status: AC
  Administered 2013-07-23: 25 mg via ORAL
  Filled 2013-07-23: qty 1

## 2013-07-23 NOTE — ED Notes (Signed)
Pt reports the dizziness has improved but is still present. Is worse when he stands up. Reports that room is still spinning but not as much.

## 2013-07-23 NOTE — ED Notes (Signed)
Walked patiet to bathroom patient was very unsteady on feet had to bring the strecher beside the bathroom cause patient was unable to walk back

## 2013-07-23 NOTE — ED Notes (Signed)
PA ordering xanax for dizziness. Pt's friend is driving him home.

## 2013-07-23 NOTE — ED Provider Notes (Signed)
CSN: [id number]     Arrival date & time 07/23/13  4098 History   First MD Initiated Contact with Patient 07/23/13 [phone]     Chief Complaint  Patient presents with  . Dizziness  . Chest Pain  . Otalgia   (Consider location/radiation/quality/duration/timing/severity/associated sxs/prior Treatment) HPI  52 year old male with history of multiple sclerosis and recently new lesion on brain MRI performed 9 days ago presents complaining of dizziness.  Pt report he was experiencing vertigo with room spinning sensation over a week ago when he was seen in ER and had brain MRI.  Has been receiving several courses of steroid for his MS, which was scheduled by his neurologist Dr. Assunta Found.  Sts has been improving but for the past 2 days his dizziness has returned.  Report room spinning sensation worsening with position changes, and feel unsteady on feet.  Report feeling nauseated last night and had mild chest congestion.  Chest congestion has improved but dizziness is still present, improves with laying still.  Denies any significant cardiac hx, a non smoker, and denies hearing changes, runny nose, sneeze, or cough.  Aside from steroid, no other treatment tried.  Denies fever, chills, productive cough, hemoptysis, abd pain, numbness or weakness.  Denies slurring of speech or difficulty talking.    Past Medical History  Diagnosis Date  . MS (multiple sclerosis)    Past Surgical History  Procedure Laterality Date  . Hernia repair     Family History  Problem Relation Age of Onset  . Ataxia Neg Hx   . Chorea Neg Hx   . Dementia Neg Hx   . Mental retardation Neg Hx   . Migraines Neg Hx   . Multiple sclerosis Neg Hx   . Neurofibromatosis Neg Hx   . Neuropathy Neg Hx   . Parkinsonism Neg Hx   . Seizures Neg Hx   . Stroke Neg Hx    History  Substance Use Topics  . Smoking status: Never Smoker   . Smokeless tobacco: Never Used  . Alcohol Use: Yes     Comment: Occ    Review of Systems  All other  systems reviewed and are negative.    Allergies  Review of patient's allergies indicates no known allergies.  Home Medications   Current Outpatient Rx  Name  Route  Sig  Dispense  Refill  . cholecalciferol (VITAMIN D) 1000 UNITS tablet   Oral   Take 1,000 Units by mouth daily.         . interferon beta-1a (REBIF) 44 MCG/0.5ML injection   Subcutaneous   Inject 44 mcg into the skin 3 (three) times a week.          BP 140/97  Pulse 79  Temp(Src) 98.1 F (36.7 C) (Oral)  Resp 11  SpO2 99% Physical Exam  Constitutional: He appears well-developed and well-nourished. No distress.  HENT:  Head: Atraumatic.  Right Ear: External ear normal.  Left Ear: External ear normal.  Eyes: Conjunctivae and EOM are normal. Pupils are equal, round, and reactive to light.  Horizontal nystagmus favoring L side.    Neck: Normal range of motion. Neck supple.  Cardiovascular: Normal rate and regular rhythm.   Pulmonary/Chest: Effort normal and breath sounds normal.  Abdominal: Soft. There is no tenderness.  Neurological: He is alert.  Speech clear, pupils equal round reactive to light, extraocular movements intact.  Horizontal nystagmus favoring L side Normal peripheral visual fields Cranial nerves III through XII normal including no facial droop Follows  commands, moves all extremities x4, normal strength to bilateral upper and lower extremities at all major muscle groups including grip Sensation normal to light touch and pinprick Poor coordination with finger/nose but normal heel/shin. Rapid alternating movements delay No pronator drift Gait unsteady requiring assist   Skin: No rash noted.  Psychiatric: He has a normal mood and affect.    ED Course  Procedures (including critical care time)   Date: 07/23/2013  Rate: 87  Rhythm: normal sinus rhythm  QRS Axis: normal  Intervals: normal  ST/T Wave abnormalities: normal  Conduction Disutrbances: none  Narrative Interpretation:    Old EKG Reviewed: No significant changes noted     10:17 AM Pt here with dizziness.  Hx of MS.  Has sxs suggestive of peripheral vertigo especially worsening with positional changes.  Recent MRI without evidence of acute stroke, but does show new demyelination consistent with MS.  i doubt new stroke today.  I suspect sxs 2/2 to MS.  However since pt has sxs also suggestive of BPPV, will give meclizine.  Check basic labs, r/o cardiac causes, will continue to monitor.  Plan to consult neurologist.    1:25 PM ECG, Trop and CXR unremarkable.  Doubt ACS.  Although pt felt a bit better after meclizine, he is unable to ambulate as it worsen his dizziness.  Will consult neurologist.  Care discussed with attending.    3:16 PM i have consulted with neurologist, Dr. Everlena Cooper who recommend pt f/u on Dec 24th for further evaluation.  Otherwise, will d/c pt with meclizine and pt can also try alprazolam for dizziness.  i suspect his sxs is likely BPPV and less likely MS exacerbation.  Will also give work note.  Return precaution discussed.    Labs Review Labs Reviewed  CBC WITH DIFFERENTIAL - Abnormal; Notable for the following:    Lymphocytes Relative 9 (*)    Monocytes Relative 13 (*)    Monocytes Absolute 1.1 (*)    All other components within normal limits  COMPREHENSIVE METABOLIC PANEL - Abnormal; Notable for the following:    Glucose, Bld 137 (*)    All other components within normal limits  GLUCOSE, CAPILLARY - Abnormal; Notable for the following:    Glucose-Capillary 104 (*)    All other components within normal limits  URINALYSIS, ROUTINE W REFLEX MICROSCOPIC  POCT I-STAT TROPONIN I   Imaging Review Dg Chest 2 View  07/23/2013   CLINICAL DATA:  Slight chest pain and difficulty breathing  EXAM: CHEST  2 VIEW  COMPARISON:  None.  FINDINGS: The heart size and mediastinal contours are within normal limits. Both lungs are clear. The visualized skeletal structures are unremarkable.  IMPRESSION: No  active cardiopulmonary disease.   Electronically Signed   By: Elige Ko   On: 07/23/2013 13:03    EKG Interpretation   None       MDM   1. Vertigo   2. BPPV (benign paroxysmal positional vertigo)    BP 135/100  Pulse 82  Temp(Src) 98.1 F (36.7 C) (Oral)  Resp 16  SpO2 99%  I have reviewed nursing notes and vital signs. I personally reviewed the imaging tests through PACS system  I reviewed available ER/hospitalization records thought the EMR     Fayrene Helper, [place] 07/23/13 1519

## 2013-07-23 NOTE — ED Notes (Signed)
Checked patient blood sugar it was 104 notified RN Sarah of blood sugar

## 2013-07-23 NOTE — ED Notes (Signed)
Pt from home via [facility] with c/o of chest pain on palpation, movement, and deep breath, right ear pain, and vertigo.  Pt ambulatory with assistance.  Pt was seen here for the same 2 days ago.  Pt in NAD, A&O.

## 2013-07-24 NOTE — ED Provider Notes (Signed)
Medical screening examination/treatment/procedure(s) were performed by non-physician practitioner and as supervising physician I was immediately available for consultation/collaboration.  EKG Interpretation    Date/Time:  Friday July 23 2013 09:24:20 EST Ventricular Rate:  87 PR Interval:  179 QRS Duration: 83 QT Interval:  363 QTC Calculation: 437 R Axis:   61 Text Interpretation:  Sinus rhythm Borderline T abnormalities, inferior leads Baseline wander in lead(s) V5 Confirmed by Rubin Payor  MD, Valoria Tamburri ([id number]) on 07/23/2013 11:49:04 AM             Juliet Rude. Rubin Payor, MD 07/24/13 [phone]

## 2013-07-28 ENCOUNTER — Encounter: Payer: Self-pay | Admitting: Neurology

## 2013-07-28 ENCOUNTER — Ambulatory Visit (INDEPENDENT_AMBULATORY_CARE_PROVIDER_SITE_OTHER): Payer: BC Managed Care – PPO | Admitting: Neurology

## 2013-07-28 ENCOUNTER — Other Ambulatory Visit: Payer: Self-pay | Admitting: Neurology

## 2013-07-28 VITALS — BP 128/88 | HR 96 | Temp 98.2°F | Ht 68.5 in | Wt 201.0 lb

## 2013-07-28 DIAGNOSIS — H8190 Unspecified disorder of vestibular function, unspecified ear: Secondary | ICD-10-CM

## 2013-07-28 DIAGNOSIS — G35D Multiple sclerosis, unspecified: Secondary | ICD-10-CM

## 2013-07-28 DIAGNOSIS — G35 Multiple sclerosis: Secondary | ICD-10-CM

## 2013-07-28 NOTE — Progress Notes (Signed)
NEUROLOGY FOLLOW UP OFFICE NOTE  ELDER DAVIDIAN [id number]  HISTORY OF PRESENT ILLNESS: Devon Barron is a 52 year old right-handed man with possible OSA, who follows up for multiple sclerosis and dizziness.  Records and images were personally reviewed where available.    UPDATE:  For the past 2 weeks, he has experienced 3 episodes of spinning sensation.  It can occur while sitting in the car, standing up or with sudden head movements.  It lasts several minutes.  There is no associated nausea.  He also has been experiencing generalized fatigue as well as right ear pain and head pressure.  No hearing loss or tinnitus.  He presented to the ED on 07/13/13 with these symptoms.  He was afebrile and bloodwork did not reveal sign of infection.  MRI of brain with and without contrast was performed, which revealed a single new 7mm left parietal enhancing white matter lesion, as well as progression of prior chronic demyelination.  He received a dose of Solumedrol 1gm for 3 days.  He returned to the ED the following week because he had another episode of spinning.  Meclizine wasn't effective but alprazolam was helpful.  He has been doing well overall.  He has been under a lot of stress and anxiety as his mother passed away last month.  So he feels he is more hypersensitive and worried about things.  He reports he has been compliant with the Rebif.  He was diagnosed with MS in 2001.  At that time, he presented with left facial myokymia. He reports that he hasn't had many flare ups, and only a couple of flare-ups requiring pulse steroids.  They typically present as blurred vision, headache, dizziness, unsteadiness and fatigue.  He has both supratentorial and infratentorial demyelinating lesions.  His cervical cord is okay.  He has a history of lapses with physician follow-ups and non-adherence to disease-modifying agents, mostly due to financial reasons.  He previously was on Avonex, and then Rebif.  In the past,  neutralizing antibodies have been negative.  Due to financial reasons, he has not followed up with a neurologist in 1.5 years and had not taken his Rebif since May.  Since May, he developed increased fatigue, stiffness, blurred vision and intermittent headaches, similar to prior flare ups.  He presented to the ED on 05/03/13.  Physical exam revealed slight right sided facial droop.  Initially, a CT of the head was performed and revealed decreased attenuation at the right subfrontal-anterior temporal junction.  MRI Brain w/wo contrast was performed, and revealed extensive periventricular and subcortical T2 hyperintensities, as well as focal restricted diffusion within the splenium of the corpus callosum, concerning for active demyelination.  It was recommended that he be admitted for IV steroids, but patient declined.  He was discharged on prednisone taper, which helped with his walking.  I first saw him in October 2014.  At that time, he exhibited right lower facial weakness, trace weakness in left tricep, mild difficulty with tandem walking and mild sway on Romberg.  We restarted his Rebif.  His vitamin D level was very low, at 18.  I recommended starting D3 4000 IU daily.  PAST MEDICAL HISTORY: Past Medical History  Diagnosis Date  . MS (multiple sclerosis)     MEDICATIONS: Current Outpatient Prescriptions on File Prior to Visit  Medication Sig Dispense Refill  . ALPRAZolam (XANAX) 0.5 MG tablet Take 1 tablet (0.5 mg total) by mouth 2 (two) times daily as needed (dizzy).  10  tablet  0  . cholecalciferol (VITAMIN D) 1000 UNITS tablet Take 1,000 Units by mouth daily.      . interferon beta-1a (REBIF) 44 MCG/0.5ML injection Inject 44 mcg into the skin 3 (three) times a week.      . meclizine (ANTIVERT) 50 MG tablet Take 1 tablet (50 mg total) by mouth 3 (three) times daily as needed for dizziness.  30 tablet  0   No current facility-administered medications on file prior to visit.    ALLERGIES: No  Known Allergies  FAMILY HISTORY: Family History  Problem Relation Age of Onset  . Ataxia Neg Hx   . Chorea Neg Hx   . Dementia Neg Hx   . Mental retardation Neg Hx   . Migraines Neg Hx   . Multiple sclerosis Neg Hx   . Neurofibromatosis Neg Hx   . Neuropathy Neg Hx   . Parkinsonism Neg Hx   . Seizures Neg Hx   . Stroke Neg Hx     SOCIAL HISTORY: History   Social History  . Marital Status: Single    Spouse Name: N/A    Number of Children: N/A  . Years of Education: N/A   Occupational History  . Not on file.   Social History Main Topics  . Smoking status: Never Smoker   . Smokeless tobacco: Never Used  . Alcohol Use: Yes     Comment: Occ  . Drug Use: No  . Sexual Activity: Not on file   Other Topics Concern  . Not on file   Social History Narrative  . No narrative on file    REVIEW OF SYSTEMS: Constitutional: No fevers, chills, or sweats, no generalized fatigue, change in appetite Eyes: No visual changes, double vision, eye pain Ear, nose and throat: No hearing loss, ear pain, nasal congestion, sore throat Cardiovascular: No chest pain, palpitations Respiratory:  No shortness of breath at rest or with exertion, wheezes GastrointestinaI: No nausea, vomiting, diarrhea, abdominal pain, fecal incontinence Genitourinary:  No dysuria, urinary retention or frequency Musculoskeletal:  No neck pain, back pain Integumentary: No rash, pruritus, skin lesions Neurological: as above Psychiatric: No depression, insomnia, anxiety Endocrine: No palpitations, fatigue, diaphoresis, mood swings, change in appetite, change in weight, increased thirst Hematologic/Lymphatic:  No anemia, purpura, petechiae. Allergic/Immunologic: no itchy/runny eyes, nasal congestion, recent allergic reactions, rashes  PHYSICAL EXAM: Filed Vitals:   07/28/13 1136  BP: 128/88  Pulse: 96  Temp: 98.2 F (36.8 C)   General: No acute distress Head:  Normocephalic/atraumatic Neck: supple, no  paraspinal tenderness, full range of motion Heart:  Regular rate and rhythm Lungs:  Clear to auscultation bilaterally Back: No paraspinal tenderness Neurological Exam: alert and oriented to person, place, and time. Speech fluent and not dysarthric, language intact.  Mild right facial weakness, otherwise CN II-XII intact. Fundoscopic exam unremarkable, no papilledema.  Bulk and tone normal, muscle strength 5/5 throughout.  Sensation to light touch, temperature and vibration intact.  Deep tendon reflexes 2+ throughout, toes downgoing.  Finger to nose and heel to shin testing intact.  Gait normal, Romberg negative.  IMPRESSION: Peripheral vestibulopathy, likely related to viral illness.  I don't think it is related to MS. Relapsing-remitting MS New small active demyelinating lesion on MRI, incidental finding which does not correlate with symptoms.  PLAN: 1.  May continue alprazolam but sparingly. 2.  Continue Rebif. 3.  Check interbetaferon neutralizing antibodies 4.  Vitamin D3 supplementation. 5.  Recheck MRI in 3 months. 6.  Follow up after  MRI 7.  Establish care with PCP  Shon Millet, DO          Check interbetaferon neutralizing antibodies Continue Rebif Recheck MRI of brain with and without contrast in 3 months.  If there are any new lesions, then would have to consider changing Rebif to another disease-modifying agent.

## 2013-07-28 NOTE — Patient Instructions (Addendum)
I think it is an inner ear problem.  I don't think it is related to the MS 1.  Use the alprazolam (small pill) sparingly (don't use often) 2.  Take it easy and don't make sudden movements of the head or body.  See how you do over the weekend and you can call the office early next week if needed. 3.  We will repeat MRI in 3 months.  Follow up soon after that. 4.  Refer you to establish care with a primary care doctor.

## 2013-08-12 ENCOUNTER — Ambulatory Visit (HOSPITAL_COMMUNITY): Payer: BC Managed Care – PPO

## 2013-08-17 ENCOUNTER — Ambulatory Visit: Payer: BC Managed Care – PPO | Admitting: Neurology

## 2013-08-31 ENCOUNTER — Ambulatory Visit: Payer: BC Managed Care – PPO | Admitting: Physician Assistant

## 2013-09-01 ENCOUNTER — Ambulatory Visit: Payer: BC Managed Care – PPO | Admitting: Internal Medicine

## 2013-09-03 ENCOUNTER — Ambulatory Visit (INDEPENDENT_AMBULATORY_CARE_PROVIDER_SITE_OTHER): Payer: BC Managed Care – PPO | Admitting: Internal Medicine

## 2013-09-03 ENCOUNTER — Other Ambulatory Visit (INDEPENDENT_AMBULATORY_CARE_PROVIDER_SITE_OTHER): Payer: BC Managed Care – PPO

## 2013-09-03 VITALS — BP 112/82 | HR 90 | Temp 98.0°F | Resp 16 | Ht 68.5 in | Wt 197.0 lb

## 2013-09-03 DIAGNOSIS — R7309 Other abnormal glucose: Secondary | ICD-10-CM

## 2013-09-03 DIAGNOSIS — R9431 Abnormal electrocardiogram [ECG] [EKG]: Secondary | ICD-10-CM

## 2013-09-03 DIAGNOSIS — R079 Chest pain, unspecified: Secondary | ICD-10-CM

## 2013-09-03 DIAGNOSIS — Z0000 Encounter for general adult medical examination without abnormal findings: Secondary | ICD-10-CM

## 2013-09-03 DIAGNOSIS — Z136 Encounter for screening for cardiovascular disorders: Secondary | ICD-10-CM

## 2013-09-03 DIAGNOSIS — Z23 Encounter for immunization: Secondary | ICD-10-CM

## 2013-09-03 LAB — URINALYSIS, ROUTINE W REFLEX MICROSCOPIC
Bilirubin Urine: NEGATIVE
Hgb urine dipstick: NEGATIVE
KETONES UR: NEGATIVE
Leukocytes, UA: NEGATIVE
Nitrite: NEGATIVE
Specific Gravity, Urine: 1.025 (ref 1.000–1.030)
Total Protein, Urine: NEGATIVE
URINE GLUCOSE: NEGATIVE
UROBILINOGEN UA: 1 (ref 0.0–1.0)
pH: 6 (ref 5.0–8.0)

## 2013-09-03 LAB — COMPREHENSIVE METABOLIC PANEL
ALT: 22 U/L (ref 0–53)
AST: 16 U/L (ref 0–37)
Albumin: 4.1 g/dL (ref 3.5–5.2)
Alkaline Phosphatase: 48 U/L (ref 39–117)
BILIRUBIN TOTAL: 0.7 mg/dL (ref 0.3–1.2)
BUN: 9 mg/dL (ref 6–23)
CALCIUM: 9.4 mg/dL (ref 8.4–10.5)
CHLORIDE: 104 meq/L (ref 96–112)
CO2: 28 mEq/L (ref 19–32)
Creatinine, Ser: 0.9 mg/dL (ref 0.4–1.5)
GFR: 110.89 mL/min (ref >60.00)
Glucose, Bld: 87 mg/dL (ref 70–99)
Potassium: 3.5 mEq/L (ref 3.5–5.1)
Sodium: 139 mEq/L (ref 135–145)
Total Protein: 7.2 g/dL (ref 6.0–8.3)

## 2013-09-03 LAB — CBC WITH DIFFERENTIAL/PLATELET
BASOS PCT: 0.4 % (ref 0.0–3.0)
Basophils Absolute: 0 10*3/uL (ref 0.0–0.1)
Eosinophils Absolute: 0 10*3/uL (ref 0.0–0.7)
Eosinophils Relative: 0.6 % (ref 0.0–5.0)
HEMATOCRIT: 44.2 % (ref 39.0–52.0)
Hemoglobin: 14.1 g/dL (ref 13.0–17.0)
LYMPHS ABS: 1.2 10*3/uL (ref 0.7–4.0)
Lymphocytes Relative: 22.3 % (ref 12.0–46.0)
MCHC: 32 g/dL (ref 30.0–36.0)
MCV: 88.7 fl (ref 78.0–100.0)
MONOS PCT: 14.6 % — AB (ref 3.0–12.0)
Monocytes Absolute: 0.8 10*3/uL (ref 0.1–1.0)
NEUTROS ABS: 3.2 10*3/uL (ref 1.4–7.7)
Neutrophils Relative %: 62.1 % (ref 43.0–77.0)
Platelets: 179 10*3/uL (ref 150.0–400.0)
RBC: 4.98 Mil/uL (ref 4.22–5.81)
RDW: 13.9 % (ref 11.5–14.6)
WBC: 5.2 10*3/uL (ref 4.5–10.5)

## 2013-09-03 LAB — PSA: PSA: 2.27 ng/mL (ref 0.10–4.00)

## 2013-09-03 LAB — HEMOGLOBIN A1C: HEMOGLOBIN A1C: 6.4 % (ref 4.6–6.5)

## 2013-09-03 LAB — LIPID PANEL
Cholesterol: 190 mg/dL (ref 0–200)
HDL: 49.6 mg/dL (ref >39.00)
LDL Cholesterol: 106 mg/dL — ABNORMAL HIGH (ref 0–99)
Total CHOL/HDL Ratio: 4
Triglycerides: 171 mg/dL — ABNORMAL HIGH (ref 0.0–149.0)
VLDL: 34.2 mg/dL (ref 0.0–40.0)

## 2013-09-03 LAB — CARDIAC PANEL
CK-MB: 1 ng/mL (ref 0.3–4.0)
Relative Index: 1.1 calc (ref 0.0–2.5)
Total CK: 90 U/L (ref 7–232)

## 2013-09-03 LAB — TSH: TSH: 1.04 u[IU]/mL (ref 0.35–5.50)

## 2013-09-03 LAB — FECAL OCCULT BLOOD, GUAIAC: FECAL OCCULT BLD: NEGATIVE

## 2013-09-03 LAB — TROPONIN I: TROPONIN I: 0.02 ng/mL (ref <0.06)

## 2013-09-03 LAB — HEPATITIS C ANTIBODY: HCV Ab: NEGATIVE

## 2013-09-03 NOTE — Progress Notes (Signed)
Subjective:    Patient ID: Devon Barron, male    DOB: 03-18-1961, 52 y.o.   MRN: [id number]  HPI Comments: New to me he complains of intermittent CP and dizziness for one month - he was seen in the ER and EKG showed NS ST/T wave changes.  Chest Pain  This is a recurrent problem. The current episode started more than 1 month ago. The onset quality is gradual. The problem occurs intermittently. The problem has been unchanged. The pain is present in the substernal region. The pain is at a severity of 1/10. The pain is mild. The quality of the pain is described as tightness. The pain does not radiate. Associated symptoms include dizziness. Pertinent negatives include no abdominal pain, back pain, claudication, cough, diaphoresis, exertional chest pressure, fever, headaches, hemoptysis, irregular heartbeat, leg pain, lower extremity edema, malaise/fatigue, nausea, near-syncope, numbness, orthopnea, palpitations, PND, shortness of breath, sputum production, syncope, vomiting or weakness. The pain is aggravated by nothing. He has tried nothing for the symptoms. Prior diagnostic workup includes chest x-ray.      Review of Systems  Constitutional: Negative.  Negative for fever, chills, malaise/fatigue, diaphoresis, appetite change and fatigue.  HENT: Negative.   Eyes: Negative.   Respiratory: Negative.  Negative for cough, hemoptysis, sputum production and shortness of breath.   Cardiovascular: Positive for chest pain. Negative for palpitations, orthopnea, claudication, syncope, PND and near-syncope.  Gastrointestinal: Negative.  Negative for nausea, vomiting, abdominal pain and constipation.  Endocrine: Negative.   Genitourinary: Negative.  Negative for dysuria, urgency, hematuria, difficulty urinating and testicular pain.  Musculoskeletal: Negative.  Negative for back pain.  Skin: Negative.   Allergic/Immunologic: Negative.   Neurological: Positive for dizziness. Negative for weakness, numbness  and headaches.  Hematological: Negative.  Negative for adenopathy. Does not bruise/bleed easily.  Psychiatric/Behavioral: Negative.        Objective:   Physical Exam  Vitals reviewed. Constitutional: He is oriented to person, place, and time. He appears well-developed and well-nourished. No distress.  HENT:  Head: Normocephalic and atraumatic.  Mouth/Throat: Oropharynx is clear and moist. No oropharyngeal exudate.  Eyes: Conjunctivae are normal. Right eye exhibits no discharge. Left eye exhibits no discharge. No scleral icterus.  Neck: Normal range of motion. Neck supple. No JVD present. No tracheal deviation present. No thyromegaly present.  Cardiovascular: Normal rate, regular rhythm, normal heart sounds and intact distal pulses.  Exam reveals no gallop and no friction rub.   No murmur heard. Pulmonary/Chest: Effort normal and breath sounds normal. No stridor. No respiratory distress. He has no wheezes. He has no rales. He exhibits no tenderness.  Abdominal: Soft. Bowel sounds are normal. He exhibits no distension and no mass. There is no tenderness. There is no rebound and no guarding. Hernia confirmed negative in the right inguinal area and confirmed negative in the left inguinal area.  Genitourinary: Rectum normal, testes normal and penis normal. Rectal exam shows no external hemorrhoid, no internal hemorrhoid, no fissure, no mass, no tenderness and anal tone normal. Guaiac negative stool. Prostate is enlarged (1+ smooth symm BPH). Prostate is not tender. Right testis shows no mass, no swelling and no tenderness. Right testis is descended. Left testis shows no mass, no swelling and no tenderness. Left testis is descended. Uncircumcised. No phimosis, paraphimosis, hypospadias, penile erythema or penile tenderness. No discharge found.  Musculoskeletal: Normal range of motion. He exhibits no edema and no tenderness.  Lymphadenopathy:    He has no cervical adenopathy.  Right: No inguinal  adenopathy present.       Left: No inguinal adenopathy present.  Neurological: He is oriented to person, place, and time.  Skin: Skin is warm and dry. No rash noted. He is not diaphoretic. No erythema. No pallor.  Psychiatric: He has a normal mood and affect. His behavior is normal. Judgment and thought content normal.     Lab Results  Component Value Date   WBC 8.1 07/23/2013   HGB 15.1 07/23/2013   HCT 44.5 07/23/2013   PLT 208 07/23/2013   GLUCOSE 137* 07/23/2013   ALT 23 07/23/2013   AST 16 07/23/2013   NA 136 07/23/2013   K 3.8 07/23/2013   CL 99 07/23/2013   CREATININE 0.79 07/23/2013   BUN 7 07/23/2013   CO2 27 07/23/2013       Assessment & Plan:

## 2013-09-03 NOTE — Progress Notes (Signed)
Pre visit review using our clinic review tool, if applicable. No additional management support is needed unless otherwise documented below in the visit note. 

## 2013-09-03 NOTE — Patient Instructions (Signed)
Chest Pain (Nonspecific) It is often hard to give a specific diagnosis for the cause of chest pain. There is always a chance that your pain could be related to something serious, such as a heart attack or a blood clot in the lungs. You need to follow up with your caregiver for further evaluation. CAUSES   Heartburn.  Pneumonia or bronchitis.  Anxiety or stress.  Inflammation around your heart (pericarditis) or lung (pleuritis or pleurisy).  A blood clot in the lung.  A collapsed lung (pneumothorax). It can develop suddenly on its own (spontaneous pneumothorax) or from injury (trauma) to the chest.  Shingles infection (herpes zoster virus). The chest wall is composed of bones, muscles, and cartilage. Any of these can be the source of the pain.  The bones can be bruised by injury.  The muscles or cartilage can be strained by coughing or overwork.  The cartilage can be affected by inflammation and become sore (costochondritis). DIAGNOSIS  Lab tests or other studies, such as X-rays, electrocardiography, stress testing, or cardiac imaging, may be needed to find the cause of your pain.  TREATMENT   Treatment depends on what may be causing your chest pain. Treatment may include:  Acid blockers for heartburn.  Anti-inflammatory medicine.  Pain medicine for inflammatory conditions.  Antibiotics if an infection is present.  You may be advised to change lifestyle habits. This includes stopping smoking and avoiding alcohol, caffeine, and chocolate.  You may be advised to keep your head raised (elevated) when sleeping. This reduces the chance of acid going backward from your stomach into your esophagus.  Most of the time, nonspecific chest pain will improve within 2 to 3 days with rest and mild pain medicine. HOME CARE INSTRUCTIONS   If antibiotics were prescribed, take your antibiotics as directed. Finish them even if you start to feel better.  For the next few days, avoid physical  activities that bring on chest pain. Continue physical activities as directed.  Do not smoke.  Avoid drinking alcohol.  Only take over-the-counter or prescription medicine for pain, discomfort, or fever as directed by your caregiver.  Follow your caregiver's suggestions for further testing if your chest pain does not go away.  Keep any follow-up appointments you made. If you do not go to an appointment, you could develop lasting (chronic) problems with pain. If there is any problem keeping an appointment, you must call to reschedule. SEEK MEDICAL CARE IF:   You think you are having problems from the medicine you are taking. Read your medicine instructions carefully.  Your chest pain does not go away, even after treatment.  You develop a rash with blisters on your chest. SEEK IMMEDIATE MEDICAL CARE IF:   You have increased chest pain or pain that spreads to your arm, neck, jaw, back, or abdomen.  You develop shortness of breath, an increasing cough, or you are coughing up blood.  You have severe back or abdominal pain, feel nauseous, or vomit.  You develop severe weakness, fainting, or chills.  You have a fever. THIS IS AN EMERGENCY. Do not wait to see if the pain will go away. Get medical help at once. Call your local emergency services (911 in [place] Do not drive yourself to the hospital. MAKE SURE YOU:   Understand these instructions.  Will watch your condition.  Will get help right away if you are not doing well or get worse. Document Released: 05/01/2005 Document Revised: 10/14/2011 Document Reviewed: 02/25/2008 [facility] Patient Information 2014 [place],  [employer]. Health Maintenance, Males A healthy lifestyle and preventative care can promote health and wellness.  Maintain regular health, dental, and eye exams.  Eat a healthy diet. Foods like vegetables, fruits, whole grains, low-fat dairy products, and lean protein foods contain the nutrients you need and are low in  calories. Decrease your intake of foods high in solid fats, added sugars, and salt. Get information about a proper diet from your health care provider, if necessary.  Regular physical exercise is one of the most important things you can do for your health. Most adults should get at least 150 minutes of moderate-intensity exercise (any activity that increases your heart rate and causes you to sweat) each week. In addition, most adults need muscle-strengthening exercises on 2 or more days a week.   Maintain a healthy weight. The body mass index (BMI) is a screening tool to identify possible weight problems. It provides an estimate of body fat based on height and weight. Your health care provider can find your BMI and can help you achieve or maintain a healthy weight. For males 20 years and older:  A BMI below 18.5 is considered underweight.  A BMI of 18.5 to 24.9 is normal.  A BMI of 25 to 29.9 is considered overweight.  A BMI of 30 and above is considered obese.  Maintain normal blood lipids and cholesterol by exercising and minimizing your intake of saturated fat. Eat a balanced diet with plenty of fruits and vegetables. Blood tests for lipids and cholesterol should begin at age 20 and be repeated every 5 years. If your lipid or cholesterol levels are high, you are over 50, or you are at high risk for heart disease, you may need your cholesterol levels checked more frequently.Ongoing high lipid and cholesterol levels should be treated with medicines, if diet and exercise are not working.  If you smoke, find out from your health care provider how to quit. If you do not use tobacco, do not start.  Lung cancer screening is recommended for adults aged 55 80 years who are at high risk for developing lung cancer because of a history of smoking. A yearly low-dose CT scan of the lungs is recommended for people who have at least a 30-pack-year history of smoking and are a current smoker or have quit  within the past 15 years. A pack year of smoking is smoking an average of 1 pack of cigarettes a day for 1 year (for example, a 30-pack-year history of smoking could mean smoking 1 pack a day for 30 years or 2 packs a day for 15 years). Yearly screening should continue until the smoker has stopped smoking for at least 15 years. Yearly screening should be stopped for people who develop a health problem that would prevent them from having lung cancer treatment.  If you choose to drink alcohol, do not have more than 2 drinks per day. One drink is considered to be 12 oz (360 mL) of beer, 5 oz (150 mL) of wine, or 1.5 oz (45 mL) of liquor.  Avoid use of street drugs. Do not share needles with anyone. Ask for help if you need support or instructions about stopping the use of drugs.  High blood pressure causes heart disease and increases the risk of stroke. Blood pressure should be checked at least every 1 2 years. Ongoing high blood pressure should be treated with medicines if weight loss and exercise are not effective.  If you are 45 53 years old, ask   your health care provider if you should take aspirin to prevent heart disease.  Diabetes screening involves taking a blood sample to check your fasting blood sugar level. This should be done once every 3 years after age 22, if you are at a normal weight and without risk factors for diabetes. Testing should be considered at a younger age or be carried out more frequently if you are overweight and have at least 1 risk factor for diabetes.  Colorectal cancer can be detected and often prevented. Most routine colorectal cancer screening begins at the age of 35 and continues through age 34. However, your health care provider may recommend screening at an earlier age if you have risk factors for colon cancer. On a yearly basis, your health care provider may provide home test kits to check for hidden blood in the stool. A small camera at the end of a tube may be used to  directly examine the colon (sigmoidoscopy or colonoscopy) to detect the earliest forms of colorectal cancer. Talk to your health care provider about this at age 67, when routine screening begins. A direct exam of the colon should be repeated every 5 10 years through age 43, unless early forms of pre-cancerous polyps or small growths are found.  People who are at an increased risk for hepatitis B should be screened for this virus. You are considered at high risk for hepatitis B if:  You were born in a country where hepatitis B occurs often. Talk with your health care provider about which countries are considered high-risk.  Your parents were born in a high-risk country and you have not received a shot to protect against hepatitis B (hepatitis B vaccine).  You have HIV or AIDS.  You use needles to inject street drugs.  You live with, or have sex with, someone who has hepatitis B.  You are a man who has sex with other men (MSM).  You get hemodialysis treatment.  You take certain medicines for conditions like cancer, organ transplantation, and autoimmune conditions.  Hepatitis C blood testing is recommended for all people born from [birth year] through 1965 and any individual with known risk factors for hepatitis C.  Healthy men should no longer receive prostate-specific antigen (PSA) blood tests as part of routine cancer screening. Talk to your health care provider about prostate cancer screening.  Testicular cancer screening is not recommended for adolescents or adult males who have no symptoms. Screening includes self-exam, a health care provider exam, and other screening tests. Consult with your health care provider about any symptoms you have or any concerns you have about testicular cancer.  Practice safe sex. Use condoms and avoid high-risk sexual practices to reduce the spread of sexually transmitted infections (STIs).  Use sunscreen. Apply sunscreen liberally and repeatedly throughout the  day. You should seek shade when your shadow is shorter than you. Protect yourself by wearing long sleeves, pants, a wide-brimmed hat, and sunglasses year round, whenever you are outdoors.  Tell your health care provider of new moles or changes in moles, especially if there is a change in shape or color. Also tell your provider if a mole is larger than the size of a pencil eraser.  A one-time screening for abdominal aortic aneurysm (AAA) and surgical repair of large AAAs by ultrasound is recommended for men aged 19 75 years who are current or former smokers.  Stay current with your vaccines (immunizations). Document Released: 01/18/2008 Document Revised: 05/12/2013 Document Reviewed: 12/17/2010 [employer] Patient Information 2014  [employer].

## 2013-09-05 ENCOUNTER — Encounter: Payer: Self-pay | Admitting: Internal Medicine

## 2013-09-05 NOTE — Assessment & Plan Note (Signed)
His CP is atypical, his EKG today shows a lot of artifact despite several attempts but there is persistent NS ST/T wave changes. I will check his cardiac enzymes today and if they are positive will advise further. He has risk factors so I have asked him to have an MPI done to screen for CAD.

## 2013-09-05 NOTE — Assessment & Plan Note (Signed)
Exam done Vaccines were updated He was referred for a colonoscopy Labs ordered Pt ed material was given 

## 2013-09-05 NOTE — Assessment & Plan Note (Signed)
He has pre-diabetes 

## 2013-09-06 ENCOUNTER — Telehealth: Payer: Self-pay | Admitting: *Deleted

## 2013-09-06 NOTE — Telephone Encounter (Signed)
To[facility]hel[facility] Flake Fax: [phone] From: [facility] Date/ Time: 09/03/2013 7:22 PM Taken By: Jackelyn Knife, CSR Caller: [place]: Randell Loop Patient: Devon Barron, Devon Barron DOB: 1960-11-27 Phone: [phone] Reason for Call: Janett Billow is calling from [place] regarding a Triponin I ordered on Valda Lamb by Marcello Moores Jones1/30/2015 4:12:00 PM. The results were 0.02, no indication of myocardial injury. Regarding Appointment: Appt Date: Appt Time: Unknown Provider: Reason: Details:

## 2013-09-08 ENCOUNTER — Telehealth: Payer: Self-pay | Admitting: Neurology

## 2013-09-08 ENCOUNTER — Telehealth: Payer: Self-pay | Admitting: *Deleted

## 2013-09-08 NOTE — Telephone Encounter (Signed)
Wants work note to limit work day to 6 hours per day for February d/t MS fatigue. Patient asks that we NOT mention MS on the note. Please call [phone] / Sherri S.

## 2013-09-08 NOTE — Telephone Encounter (Signed)
I won't be able to draft a letter like that.  I typically do not write work-excuse letters for symptoms of fatigue.

## 2013-09-08 NOTE — Telephone Encounter (Signed)
I spoke again with patient to reinforce that to write an excuse to cut back his hours of work for being tired and or fatigue without the mention of MS is not a standard practices to do at this time .

## 2013-09-08 NOTE — Telephone Encounter (Signed)
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

## 2013-09-17 ENCOUNTER — Ambulatory Visit (INDEPENDENT_AMBULATORY_CARE_PROVIDER_SITE_OTHER): Payer: BC Managed Care – PPO | Admitting: Internal Medicine

## 2013-09-17 ENCOUNTER — Encounter: Payer: Self-pay | Admitting: Internal Medicine

## 2013-09-17 VITALS — BP 120/74 | HR 80 | Temp 98.1°F | Resp 16 | Ht 68.5 in | Wt 201.0 lb

## 2013-09-17 DIAGNOSIS — K219 Gastro-esophageal reflux disease without esophagitis: Secondary | ICD-10-CM | POA: Insufficient documentation

## 2013-09-17 DIAGNOSIS — R9431 Abnormal electrocardiogram [ECG] [EKG]: Secondary | ICD-10-CM

## 2013-09-17 DIAGNOSIS — R079 Chest pain, unspecified: Secondary | ICD-10-CM

## 2013-09-17 DIAGNOSIS — M5412 Radiculopathy, cervical region: Secondary | ICD-10-CM | POA: Insufficient documentation

## 2013-09-17 MED ORDER — DEXLANSOPRAZOLE 60 MG PO CPDR
60.0000 mg | DELAYED_RELEASE_CAPSULE | Freq: Every day | ORAL | Status: DC
Start: 2013-09-17 — End: 2014-02-15

## 2013-09-17 NOTE — Patient Instructions (Signed)
Gastroesophageal Reflux Disease, Adult  Gastroesophageal reflux disease (GERD) happens when acid from your stomach flows up into the esophagus. When acid comes in contact with the esophagus, the acid causes soreness (inflammation) in the esophagus. Over time, GERD may create small holes (ulcers) in the lining of the esophagus.  CAUSES   · Increased body weight. This puts pressure on the stomach, making acid rise from the stomach into the esophagus.  · Smoking. This increases acid production in the stomach.  · Drinking alcohol. This causes decreased pressure in the lower esophageal sphincter (valve or ring of muscle between the esophagus and stomach), allowing acid from the stomach into the esophagus.  · Late evening meals and a full stomach. This increases pressure and acid production in the stomach.  · A malformed lower esophageal sphincter.  Sometimes, no cause is found.  SYMPTOMS   · Burning pain in the lower part of the mid-chest behind the breastbone and in the mid-stomach area. This may occur twice a week or more often.  · Trouble swallowing.  · Sore throat.  · Dry cough.  · Asthma-like symptoms including chest tightness, shortness of breath, or wheezing.  DIAGNOSIS   Your caregiver may be able to diagnose GERD based on your symptoms. In some cases, X-rays and other tests may be done to check for complications or to check the condition of your stomach and esophagus.  TREATMENT   Your caregiver may recommend over-the-counter or prescription medicines to help decrease acid production. Ask your caregiver before starting or adding any new medicines.   HOME CARE INSTRUCTIONS   · Change the factors that you can control. Ask your caregiver for guidance concerning weight loss, quitting smoking, and alcohol consumption.  · Avoid foods and drinks that make your symptoms worse, such as:  · Caffeine or alcoholic drinks.  · Chocolate.  · Peppermint or mint flavorings.  · Garlic and onions.  · Spicy foods.  · Citrus fruits,  such as oranges, lemons, or limes.  · Tomato-based foods such as sauce, chili, salsa, and pizza.  · Fried and fatty foods.  · Avoid lying down for the 3 hours prior to your bedtime or prior to taking a nap.  · Eat small, frequent meals instead of large meals.  · Wear loose-fitting clothing. Do not wear anything tight around your waist that causes pressure on your stomach.  · Raise the head of your bed 6 to 8 inches with wood blocks to help you sleep. Extra pillows will not help.  · Only take over-the-counter or prescription medicines for pain, discomfort, or fever as directed by your caregiver.  · Do not take aspirin, ibuprofen, or other nonsteroidal anti-inflammatory drugs (NSAIDs).  SEEK IMMEDIATE MEDICAL CARE IF:   · You have pain in your arms, neck, jaw, teeth, or back.  · Your pain increases or changes in intensity or duration.  · You develop nausea, vomiting, or sweating (diaphoresis).  · You develop shortness of breath, or you faint.  · Your vomit is green, yellow, black, or looks like coffee grounds or blood.  · Your stool is red, bloody, or black.  These symptoms could be signs of other problems, such as heart disease, gastric bleeding, or esophageal bleeding.  MAKE SURE YOU:   · Understand these instructions.  · Will watch your condition.  · Will get help right away if you are not doing well or get worse.  Document Released: 05/01/2005 Document Revised: 10/14/2011 Document Reviewed: 02/08/2011  [employer]® Patient   Information ©2014 [employer].

## 2013-09-17 NOTE — Progress Notes (Signed)
Subjective:    Patient ID: Devon Barron, male    DOB: May 17, 1961, 53 y.o.   MRN: [id number]  Gastrophageal Reflux He complains of belching and heartburn. He reports no abdominal pain, no chest pain, no choking, no coughing, no dysphagia, no early satiety, no globus sensation, no hoarse voice, no nausea, no sore throat, no stridor, no tooth decay, no water brash or no wheezing. This is a recurrent problem. The current episode started more than 1 year ago. The problem occurs frequently. The problem has been gradually worsening. The heartburn duration is several minutes. The heartburn is located in the substernum. The heartburn is of moderate intensity. The heartburn does not wake him from sleep. The heartburn does not limit his activity. The heartburn doesn't change with position. Nothing aggravates the symptoms. Associated symptoms include fatigue. Pertinent negatives include no anemia, melena, muscle weakness, orthopnea or weight loss. He has tried an antacid (tums) for the symptoms. The treatment provided mild relief.      Review of Systems  Constitutional: Positive for fatigue. Negative for fever, chills, weight loss, diaphoresis, appetite change and unexpected weight change.  HENT: Negative.  Negative for hoarse voice, sore throat, trouble swallowing and voice change.   Eyes: Negative.   Respiratory: Negative.  Negative for cough, choking and wheezing.   Cardiovascular: Negative.  Negative for chest pain, palpitations and leg swelling.  Gastrointestinal: Positive for heartburn. Negative for dysphagia, nausea, vomiting, abdominal pain, diarrhea, constipation, blood in stool and melena.  Endocrine: Negative.   Genitourinary: Negative.   Musculoskeletal: Positive for neck pain and neck stiffness. Negative for arthralgias, back pain, gait problem, joint swelling, muscle weakness and myalgias.       He has had worsening right sided neck pain that radiates to this right shoulder for a year. The  pain worsens when he turns his head to the right.  Skin: Negative.   Allergic/Immunologic: Negative.   Neurological: Negative.   Hematological: Negative.   Psychiatric/Behavioral: Negative.        Objective:   Physical Exam  Vitals reviewed. Constitutional: He is oriented to person, place, and time. He appears well-developed and well-nourished. No distress.  HENT:  Head: Normocephalic and atraumatic.  Mouth/Throat: Oropharynx is clear and moist. No oropharyngeal exudate.  Eyes: Conjunctivae are normal. Right eye exhibits no discharge. Left eye exhibits no discharge. No scleral icterus.  Neck: Normal range of motion. Neck supple. No JVD present. No tracheal deviation present. No thyromegaly present.  Cardiovascular: Normal rate, regular rhythm, normal heart sounds and intact distal pulses.  Exam reveals no gallop and no friction rub.   No murmur heard. Pulmonary/Chest: Effort normal and breath sounds normal. No stridor. No respiratory distress. He has no wheezes. He has no rales. He exhibits no tenderness.  Abdominal: Soft. Bowel sounds are normal. He exhibits no distension and no mass. There is no tenderness. There is no rebound and no guarding.  Musculoskeletal: Normal range of motion. He exhibits no edema and no tenderness.       Cervical back: Normal. He exhibits normal range of motion, no tenderness, no bony tenderness, no swelling, no edema, no deformity, no laceration, no pain, no spasm and normal pulse.  Lymphadenopathy:    He has no cervical adenopathy.  Neurological: He is oriented to person, place, and time.  Skin: Skin is warm and dry. No rash noted. He is not diaphoretic. No erythema. No pallor.     Lab Results  Component Value Date   WBC 5.2  09/03/2013   HGB 14.1 09/03/2013   HCT 44.2 09/03/2013   PLT 179.0 09/03/2013   GLUCOSE 87 09/03/2013   CHOL 190 09/03/2013   TRIG 171.0* 09/03/2013   HDL 49.60 09/03/2013   LDLCALC 106* 09/03/2013   ALT 22 09/03/2013   AST 16  09/03/2013   NA 139 09/03/2013   K 3.5 09/03/2013   CL 104 09/03/2013   CREATININE 0.9 09/03/2013   BUN 9 09/03/2013   CO2 28 09/03/2013   TSH 1.04 09/03/2013   PSA 2.27 09/03/2013   HGBA1C 6.4 09/03/2013       Assessment & Plan:

## 2013-09-18 ENCOUNTER — Encounter: Payer: Self-pay | Admitting: Internal Medicine

## 2013-09-18 NOTE — Assessment & Plan Note (Signed)
Will treat with dexilant

## 2013-09-18 NOTE — Assessment & Plan Note (Signed)
This has resolved Thallium scan has been ordered

## 2013-09-18 NOTE — Assessment & Plan Note (Signed)
Will look at his MRI of the C-spine to see if he has some radicular pathology

## 2013-09-22 ENCOUNTER — Encounter (HOSPITAL_COMMUNITY): Payer: BC Managed Care – PPO | Attending: Internal Medicine | Admitting: Radiology

## 2013-09-22 ENCOUNTER — Other Ambulatory Visit (HOSPITAL_COMMUNITY): Payer: Self-pay | Admitting: Radiology

## 2013-09-22 ENCOUNTER — Encounter (HOSPITAL_COMMUNITY): Payer: BC Managed Care – PPO

## 2013-09-22 VITALS — BP 113/77 | Ht 68.5 in | Wt 199.0 lb

## 2013-09-22 DIAGNOSIS — R0789 Other chest pain: Secondary | ICD-10-CM

## 2013-09-22 DIAGNOSIS — R9431 Abnormal electrocardiogram [ECG] [EKG]: Secondary | ICD-10-CM | POA: Insufficient documentation

## 2013-09-22 DIAGNOSIS — R079 Chest pain, unspecified: Secondary | ICD-10-CM | POA: Insufficient documentation

## 2013-09-22 MED ORDER — TECHNETIUM TC 99M SESTAMIBI GENERIC - CARDIOLITE
33.0000 | Freq: Once | INTRAVENOUS | Status: AC | PRN
  Administered 2013-09-22: 33 via INTRAVENOUS

## 2013-09-22 MED ORDER — TECHNETIUM TC 99M SESTAMIBI GENERIC - CARDIOLITE
11.0000 | Freq: Once | INTRAVENOUS | Status: AC | PRN
  Administered 2013-09-22: 11 via INTRAVENOUS

## 2013-09-22 NOTE — Progress Notes (Signed)
[facility] [address] [place], [place] [address] [phone]    Cardiology Nuclear Med Study  Devon Barron is a 53 y.o. male     MRN : [id number]     DOB: Oct 13, 1960  Procedure Date: 09/22/2013  Nuclear Med Background Indication for Stress Test:  Evaluation for Ischemia History:  no prior cardiac history or testing Cardiac Risk Factors: Family History - CAD  Symptoms:  Chest Pain/burning and Fatigue   Nuclear Pre-Procedure Caffeine/Decaff Intake:  None NPO After: 9:00pm   Lungs:  clear O2 Sat: 98% on room air. IV 0.9% NS with Angio Cath:  22g  IV Site: R Hand  IV Started by:  Devon Barron, CNMT  Chest Size (in):  46 Cup Size: n/a  Height: 5' 8.5" (1.74 m)  Weight:  199 lb (90.266 kg)  BMI:  Body mass index is 29.81 kg/(m^2). Tech Comments:  n/a    Nuclear Med Study 1 or 2 day study: 1 day  Stress Test Type:  Stress  Reading MD: n/a  Order Authorizing Provider:  Alona Bene, MD  Resting Radionuclide: Technetium 47m Sestamibi  Resting Radionuclide Dose: 11.0 mCi   Stress Radionuclide:  Technetium 64m Sestamibi  Stress Radionuclide Dose: 33.0 mCi           Stress Protocol Rest HR: 71 Stress HR: 157  Rest BP: 113/77 Stress BP: 177/81  Exercise Time (min): 7:00 METS: 8.50   Predicted Max HR: 168 bpm % Max HR: 93.45 bpm Rate Pressure Product: 27789   Dose of Adenosine (mg):  n/a Dose of Lexiscan: 0.4 mg  Dose of Atropine (mg): n/a Dose of Dobutamine: n/a mcg/kg/min (at max HR)  Stress Test Technologist: Perrin Maltese, EMT-P  Nuclear Technologist:  Devon Amor, CNMT     Rest Procedure:  Myocardial perfusion imaging was performed at rest 45 minutes following the intravenous administration of Technetium 58m Sestamibi. Rest ECG: NSR, PAC, poor R wave progression.   Stress Procedure:  The patient exercised on the treadmill utilizing the Bruce Protocol for 7:00 minutes. The patient stopped due to fatigue and denied any chest pain.   Technetium 71m Sestamibi was injected at peak exercise and myocardial perfusion imaging was performed after a brief delay. Stress ECG: No significant change from baseline ECG  QPS Raw Data Images:  Normal; no motion artifact; normal heart/lung ratio. Stress Images:  There is decreased uptake in the apex. Rest Images:  There is decreased uptake in the apex. Subtraction (SDS):  No evidence of ischemia. Transient Ischemic Dilatation (Normal <1.22):  0.89 Lung/Heart Ratio (Normal <0.45):  0.29  Quantitative Gated Spect Images QGS EDV:  113 ml QGS ESV:  53 ml  Impression Exercise Capacity:  Fair exercise capacity. BP Response:  Normal blood pressure response. Clinical Symptoms:  Fatigue, dyspnea.  ECG Impression:  No significant ST segment change suggestive of ischemia. Comparison with Prior Nuclear Study: No images to compare  Overall Impression:  Low risk stress nuclear study with no area of significant ischemia. .  LV Ejection Fraction: 53%.  LV Wall Motion:  NL LV Function; NL Wall Motion   Candee Furbish, MD

## 2013-09-23 LAB — HM COLONOSCOPY

## 2013-09-23 NOTE — Addendum Note (Signed)
Addended by: Charlton Amor on: 09/23/2013 09:16 AM   Modules accepted: Orders

## 2013-09-29 ENCOUNTER — Encounter (HOSPITAL_COMMUNITY): Payer: Self-pay | Admitting: Emergency Medicine

## 2013-09-29 ENCOUNTER — Emergency Department (HOSPITAL_COMMUNITY): Payer: BC Managed Care – PPO

## 2013-09-29 ENCOUNTER — Emergency Department (HOSPITAL_COMMUNITY)
Admission: EM | Admit: 2013-09-29 | Discharge: 2013-09-29 | Disposition: A | Discharge status: Home / Self Care | Payer: BC Managed Care – PPO | Attending: Emergency Medicine | Admitting: Emergency Medicine

## 2013-09-29 DIAGNOSIS — Y9289 Other specified places as the place of occurrence of the external cause: Secondary | ICD-10-CM | POA: Insufficient documentation

## 2013-09-29 DIAGNOSIS — S99919A Unspecified injury of unspecified ankle, initial encounter: Secondary | ICD-10-CM

## 2013-09-29 DIAGNOSIS — S0990XA Unspecified injury of head, initial encounter: Secondary | ICD-10-CM | POA: Insufficient documentation

## 2013-09-29 DIAGNOSIS — R519 Headache, unspecified: Secondary | ICD-10-CM

## 2013-09-29 DIAGNOSIS — S99929A Unspecified injury of unspecified foot, initial encounter: Secondary | ICD-10-CM

## 2013-09-29 DIAGNOSIS — M79605 Pain in left leg: Secondary | ICD-10-CM

## 2013-09-29 DIAGNOSIS — R51 Headache: Secondary | ICD-10-CM

## 2013-09-29 DIAGNOSIS — Y9301 Activity, walking, marching and hiking: Secondary | ICD-10-CM | POA: Insufficient documentation

## 2013-09-29 DIAGNOSIS — W010XXA Fall on same level from slipping, tripping and stumbling without subsequent striking against object, initial encounter: Secondary | ICD-10-CM | POA: Insufficient documentation

## 2013-09-29 DIAGNOSIS — S8990XA Unspecified injury of unspecified lower leg, initial encounter: Secondary | ICD-10-CM | POA: Insufficient documentation

## 2013-09-29 DIAGNOSIS — Z79899 Other long term (current) drug therapy: Secondary | ICD-10-CM | POA: Insufficient documentation

## 2013-09-29 DIAGNOSIS — Z8669 Personal history of other diseases of the nervous system and sense organs: Secondary | ICD-10-CM | POA: Insufficient documentation

## 2013-09-29 DIAGNOSIS — W19XXXA Unspecified fall, initial encounter: Secondary | ICD-10-CM

## 2013-09-29 MED ORDER — OXYCODONE-ACETAMINOPHEN 5-325 MG PO TABS
2.0000 | ORAL_TABLET | Freq: Once | ORAL | Status: AC
  Administered 2013-09-29: 2 via ORAL
  Filled 2013-09-29: qty 2

## 2013-09-29 MED ORDER — NAPROXEN 500 MG PO TABS: 500.0000 mg | ORAL_TABLET | Freq: Two times a day (BID) | ORAL | Status: DC

## 2013-09-29 NOTE — ED Notes (Signed)
States he thinks he missed a step and fell down about 4 steps this afternoon. He c/o BLE, back of head and R hand pain since the fall. He denies loc. He is A&Ox4, ambulatory

## 2013-09-29 NOTE — Discharge Instructions (Signed)
Follow up with your primary care doctor in 2 days. Take medications as directed for pain. Return to Emergency Department if you develop worsening Headache, confusion, weakness, numbness, nausea/vomiting, fever or chills.

## 2013-09-29 NOTE — ED Notes (Signed)
PA at bedside.

## 2013-09-29 NOTE — ED Notes (Signed)
PT ambulated with baseline gait; VSS; A&Ox3; no signs of distress; respirations even and unlabored; skin warm and dry; no questions upon discharge.  

## 2013-09-29 NOTE — ED Provider Notes (Signed)
CSN: [id number]     Arrival date & time 09/29/13  1735 History  This chart was scribed for non-physician practitioner, Pati Gallo, PA-C working with Carmin Muskrat, MD by Einar Pheasant, ED scribe. This patient was seen in room TR11C/TR11C and the patient's care was started at 9:00 PM.   Chief Complaint  Patient presents with  . Fall    The history is provided by the patient. No language interpreter was used.   HPI Comments: Devon Barron is a 53 y.o. male with a history of MS, presents to the Emergency Department complaining of a fall that occurred 2 days ago. Pt states that he slipped while walking down a hill and fell on his left side. Patient is having pain in his left thigh and also admits to a having a HA. Patient currently rates his leg pain as a 8/10, dull in nature and worse with movement. He is also concerned of his HA. Patient denies LOC but thinks he may have hit his head during the fall. Patient states he didn't slept all day yesterday, which is unusual for him. Pt is also requesting a note for work.Marland Kitchen He denies being on any blood thinners. He denies any LOC, fever, chills, N/V, cough, SOB, or chest pain.   PCP: Scarlette Calico, MD   Past Medical History  Diagnosis Date  . MS (multiple sclerosis)    Past Surgical History  Procedure Laterality Date  . Hernia repair     Family History  Problem Relation Age of Onset  . Ataxia Neg Hx   . Chorea Neg Hx   . Dementia Neg Hx   . Mental retardation Neg Hx   . Migraines Neg Hx   . Multiple sclerosis Neg Hx   . Neurofibromatosis Neg Hx   . Neuropathy Neg Hx   . Parkinsonism Neg Hx   . Seizures Neg Hx   . Stroke Neg Hx   . Early death Neg Hx   . Cancer Neg Hx   . Alcohol abuse Neg Hx   . Hearing loss Neg Hx   . Heart disease Neg Hx   . Hyperlipidemia Neg Hx   . Kidney disease Neg Hx   . Hypertension Mother   . Hypertension Father   . Diabetes Sister   . Diabetes Brother    History  Substance Use Topics  . Smoking  status: Never Smoker   . Smokeless tobacco: Never Used  . Alcohol Use: 3.0 oz/week    5 Cans of beer per week     Comment: Occ    Review of Systems  All other systems reviewed and are negative.      Allergies  Review of patient's allergies indicates no known allergies.  Home Medications   Current Outpatient Rx  Name  Route  Sig  Dispense  Refill  . ALPRAZolam (XANAX) 0.5 MG tablet   Oral   Take 1 tablet (0.5 mg total) by mouth 2 (two) times daily as needed (dizzy).   10 tablet   0   . cholecalciferol (VITAMIN D) 1000 UNITS tablet   Oral   Take 1,000 Units by mouth daily.         Marland Kitchen dexlansoprazole (DEXILANT) 60 MG capsule   Oral   Take 1 capsule (60 mg total) by mouth daily.   90 capsule   3   . interferon beta-1a (REBIF) 44 MCG/0.5ML injection   Subcutaneous   Inject 44 mcg into the skin 3 (three) times a week.         Marland Kitchen  LEVITRA 20 MG tablet               . meclizine (ANTIVERT) 50 MG tablet   Oral   Take 1 tablet (50 mg total) by mouth 3 (three) times daily as needed for dizziness.   30 tablet   0   . naproxen (NAPROSYN) 500 MG tablet   Oral   Take 1 tablet (500 mg total) by mouth 2 (two) times daily.   30 tablet   0    BP 162/102  Pulse 85  Temp(Src) 98 F (36.7 C) (Oral)  Resp 18  SpO2 98%  Physical Exam  Nursing note and vitals reviewed. Constitutional: He is oriented to person, place, and time. He appears well-developed and well-nourished. No distress.  HENT:  Head: Normocephalic and atraumatic. Head is without raccoon's eyes, without Battle's sign and without contusion.  Nose: Nose normal.  Mouth/Throat: Uvula is midline, oropharynx is clear and moist and mucous membranes are normal.  Eyes: Conjunctivae and EOM are normal. Pupils are equal, round, and reactive to light.  Neck: Trachea normal and phonation normal. Neck supple. No JVD present. Carotid bruit is not present. No tracheal deviation present.  Cardiovascular: Normal rate,  regular rhythm and normal heart sounds.  Exam reveals no gallop and no friction rub.   No murmur heard. Pulmonary/Chest: Effort normal and breath sounds normal. No respiratory distress. He has no wheezes. He has no rhonchi. He has no rales.  Musculoskeletal: Normal range of motion. He exhibits no edema.  Neurological: He is alert and oriented to person, place, and time. He has normal strength and normal reflexes. No cranial nerve deficit or sensory deficit. He displays a negative Romberg sign. Coordination and gait normal. GCS eye subscore is 4. GCS verbal subscore is 5. GCS motor subscore is 6.  CN II-XII grossly intact. Cerebellar function appears intact with finger to nose exam. Patient ambulates in room without assistance.    Skin: Skin is warm and dry. He is not diaphoretic.  Psychiatric: He has a normal mood and affect. His behavior is normal.    ED Course  Procedures (including critical care time)  DIAGNOSTIC STUDIES: Oxygen Saturation is 98% on RA, normal by my interpretation.    COORDINATION OF CARE: 9:09 PM- Will prescribe pain medication. Will order CT of his head. Pt advised of plan for treatment and pt agrees.  Labs Review Labs Reviewed - No data to display Imaging Review Ct Head Wo Contrast  09/29/2013   CLINICAL DATA:  Fall  EXAM: CT HEAD WITHOUT CONTRAST  TECHNIQUE: Contiguous axial images were obtained from the base of the skull through the vertex without intravenous contrast.  COMPARISON:  Prior MRI from 07/13/2013  FINDINGS: Scattered and confluent hypodensity within the periventricular and deep white matter is compatible with history of demyelinating disease, better evaluated on prior MRI.  There is no acute intracranial hemorrhage or infarct. No mass lesion or midline shift. Gray-white matter differentiation is well maintained. Ventricles are normal in size without evidence of hydrocephalus. CSF containing spaces are within normal limits. No extra-axial fluid collection.   The calvarium is intact.  Orbital soft tissues are within normal limits.  The paranasal sinuses and mastoid air cells are well pneumatized and free of fluid.  Scalp soft tissues are unremarkable.  IMPRESSION: 1. No acute intracranial abnormality. 2. Scattered and confluent white matter hypodensities, compatible with history of underlying demyelinating disease. Findings are better evaluated on prior MRI from 07/13/2013.   Electronically Signed  By: Jeannine Boga M.D.   On: 09/29/2013 21:58    EKG Interpretation   None       MDM   Final diagnoses:  Fall  HA (headache)  Left leg pain   Patient hypertensive at admission. BP normalized throughout stay in ED.  CT head shows no acute intracranial abnormality. Findings consistent with hx of MS.   Patient's pain controlled in ED. Patient pain appears muscular in nature. Plan to treat with NSAIDs. Patient agrees with plan. Patient given note to return to work on Friday.  Follow up with your primary care doctor in 2 days. Return to Emergency Department if you develop worsening Headache, confusion, weakness, numbness, nausea/vomiting, fever or chills.   Meds given in ED:  Medications  oxyCODONE-acetaminophen (PERCOCET/ROXICET) 5-325 MG per tablet 2 tablet (2 tablets Oral Given 09/29/13 2127)    Discharge Medication List as of 09/29/2013 10:15 PM    START taking these medications   Details  naproxen (NAPROSYN) 500 MG tablet Take 1 tablet (500 mg total) by mouth 2 (two) times daily., Starting 09/29/2013, Until Discontinued, Print       I personally performed the services described in this documentation, which was scribed in my presence. The recorded information has been reviewed and is accurate.    Sherrie George, PA-C 09/30/13 [phone]

## 2013-09-29 NOTE — ED Notes (Signed)
Pt. Stated, i was walking back from a dock at work and I slipped and fell. This was Monday morning early around 430am.  Pt. Unable to work yesterday.  C/o of both legs and back of head. Sleep all day yesterday.

## 2013-09-30 NOTE — ED Provider Notes (Signed)
Medical screening examination/treatment/procedure(s) were performed by non-physician practitioner and as supervising physician I was immediately available for consultation/collaboration.  Carmin Muskrat, MD 09/30/13 1600

## 2013-10-04 ENCOUNTER — Other Ambulatory Visit: Payer: Self-pay | Admitting: *Deleted

## 2013-10-04 ENCOUNTER — Telehealth: Payer: Self-pay | Admitting: Neurology

## 2013-10-04 DIAGNOSIS — G35 Multiple sclerosis: Secondary | ICD-10-CM

## 2013-10-04 NOTE — Telephone Encounter (Signed)
Patient wants to know about the blood test that was done around christmas please call him back [phone]

## 2013-10-10 ENCOUNTER — Ambulatory Visit
Admission: RE | Admit: 2013-10-10 | Discharge: 2013-10-10 | Disposition: A | Discharge status: Home / Self Care | Payer: BC Managed Care – PPO | Source: Ambulatory Visit | Attending: Internal Medicine | Admitting: Internal Medicine

## 2013-10-10 DIAGNOSIS — M5412 Radiculopathy, cervical region: Secondary | ICD-10-CM

## 2013-10-11 ENCOUNTER — Encounter: Payer: Self-pay | Admitting: Internal Medicine

## 2013-10-13 ENCOUNTER — Encounter: Payer: Self-pay | Admitting: Internal Medicine

## 2013-10-13 ENCOUNTER — Ambulatory Visit (INDEPENDENT_AMBULATORY_CARE_PROVIDER_SITE_OTHER): Payer: BC Managed Care – PPO | Admitting: Internal Medicine

## 2013-10-13 VITALS — BP 140/90 | HR 74 | Temp 97.8°F | Resp 16 | Ht 68.5 in | Wt 202.8 lb

## 2013-10-13 DIAGNOSIS — M5412 Radiculopathy, cervical region: Secondary | ICD-10-CM

## 2013-10-13 DIAGNOSIS — D759 Disease of blood and blood-forming organs, unspecified: Secondary | ICD-10-CM

## 2013-10-13 NOTE — Progress Notes (Signed)
Pre visit review using our clinic review tool, if applicable. No additional management support is needed unless otherwise documented below in the visit note. 

## 2013-10-13 NOTE — Progress Notes (Signed)
   Subjective:    Patient ID: Devon Barron, male    DOB: 1961/04/04, 53 y.o.   MRN: [id number]  HPI Comments: He returns for f/up regarding his neck pain, he tells me that the pain is much better. 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     Review of Systems  Constitutional: Negative.  Negative for fever, chills, diaphoresis, appetite change and fatigue.  HENT: Negative.   Eyes: Negative.   Respiratory: Negative.  Negative for cough, choking, chest tightness, shortness of breath and stridor.   Cardiovascular: Negative.  Negative for chest pain, palpitations and leg swelling.  Gastrointestinal: Negative.  Negative for nausea, vomiting, abdominal pain, diarrhea, constipation and blood in stool.  Endocrine: Negative.   Genitourinary: Negative.   Musculoskeletal: Positive for neck pain. Negative for arthralgias, back pain, gait problem, joint swelling, myalgias and neck stiffness.  Skin: Negative.   Allergic/Immunologic: Negative.   Neurological: Negative.  Negative for dizziness, tremors, weakness, light-headedness and numbness.  Hematological: Negative.  Negative for adenopathy. Does not bruise/bleed easily.  Psychiatric/Behavioral: Negative.        Objective:   Physical Exam  Vitals reviewed. Constitutional: He is oriented to person, place, and time. He appears well-developed and well-nourished. No distress.  HENT:  Head: Normocephalic and atraumatic.  Mouth/Throat: Oropharynx is clear and moist. No oropharyngeal exudate.  Eyes: Conjunctivae are normal. Right eye exhibits no discharge. Left eye exhibits no discharge. No scleral icterus.  Neck: Normal range of motion. Neck supple. No JVD present. No tracheal deviation present. No thyromegaly present.  Cardiovascular: Normal rate, regular rhythm, normal heart sounds and intact distal pulses.  Exam reveals no gallop and no friction rub.   No murmur  heard. Pulmonary/Chest: Effort normal and breath sounds normal. No stridor. No respiratory distress. He has no wheezes. He has no rales. He exhibits no tenderness.  Abdominal: Soft. Bowel sounds are normal. He exhibits no distension and no mass. There is no tenderness. There is no rebound and no guarding.  Musculoskeletal: Normal range of motion. He exhibits no edema and no tenderness.  Lymphadenopathy:    He has no cervical adenopathy.  Neurological: He is oriented to person, place, and time.  Skin: Skin is warm and dry. No rash noted. He is not diaphoretic. No erythema. No pallor.     Lab Results  Component Value Date   WBC 5.2 09/03/2013   HGB 14.1 09/03/2013   HCT 44.2 09/03/2013   PLT 179.0 09/03/2013   GLUCOSE 87 09/03/2013   CHOL 190 09/03/2013   TRIG 171.0* 09/03/2013   HDL 49.60 09/03/2013   LDLCALC 106* 09/03/2013   ALT 22 09/03/2013   AST 16 09/03/2013   NA 139 09/03/2013   K 3.5 09/03/2013   CL 104 09/03/2013   CREATININE 0.9 09/03/2013   BUN 9 09/03/2013   CO2 28 09/03/2013   TSH 1.04 09/03/2013   PSA 2.27 09/03/2013   HGBA1C 6.4 09/03/2013       Assessment & Plan:

## 2013-10-13 NOTE — Patient Instructions (Signed)

## 2013-10-14 ENCOUNTER — Telehealth: Payer: Self-pay | Admitting: Internal Medicine

## 2013-10-14 ENCOUNTER — Encounter: Payer: Self-pay | Admitting: Internal Medicine

## 2013-10-14 NOTE — Telephone Encounter (Signed)
C/D 10/14/13 for appt. 10/18/13

## 2013-10-14 NOTE — Assessment & Plan Note (Signed)
He is doing well on nsaids for the pain

## 2013-10-14 NOTE — Assessment & Plan Note (Signed)
His labs do not show any abnormalities that raise concern about the bone marrow I think that this is related to his use of interferon I have asked him to see hematology to consider further evaluation

## 2013-10-18 ENCOUNTER — Ambulatory Visit (HOSPITAL_BASED_OUTPATIENT_CLINIC_OR_DEPARTMENT_OTHER): Payer: BC Managed Care – PPO | Admitting: Internal Medicine

## 2013-10-18 ENCOUNTER — Ambulatory Visit: Payer: BC Managed Care – PPO

## 2013-10-18 ENCOUNTER — Other Ambulatory Visit (HOSPITAL_BASED_OUTPATIENT_CLINIC_OR_DEPARTMENT_OTHER): Payer: BC Managed Care – PPO

## 2013-10-18 ENCOUNTER — Other Ambulatory Visit: Payer: Self-pay | Admitting: Internal Medicine

## 2013-10-18 ENCOUNTER — Telehealth: Payer: Self-pay | Admitting: Internal Medicine

## 2013-10-18 ENCOUNTER — Encounter: Payer: Self-pay | Admitting: Internal Medicine

## 2013-10-18 VITALS — BP 144/87 | HR 81 | Temp 98.0°F | Resp 18 | Ht 68.5 in | Wt 204.2 lb

## 2013-10-18 DIAGNOSIS — G35 Multiple sclerosis: Secondary | ICD-10-CM

## 2013-10-18 DIAGNOSIS — M2569 Stiffness of other specified joint, not elsewhere classified: Secondary | ICD-10-CM

## 2013-10-18 DIAGNOSIS — M5412 Radiculopathy, cervical region: Secondary | ICD-10-CM

## 2013-10-18 DIAGNOSIS — G35D Multiple sclerosis, unspecified: Secondary | ICD-10-CM

## 2013-10-18 DIAGNOSIS — D759 Disease of blood and blood-forming organs, unspecified: Secondary | ICD-10-CM

## 2013-10-18 DIAGNOSIS — M2560 Stiffness of unspecified joint, not elsewhere classified: Secondary | ICD-10-CM

## 2013-10-18 LAB — COMPREHENSIVE METABOLIC PANEL (CC13)
ALT: 27 U/L (ref 0–55)
ANION GAP: 11 meq/L (ref 3–11)
AST: 16 U/L (ref 5–34)
Albumin: 3.8 g/dL (ref 3.5–5.0)
Alkaline Phosphatase: 71 U/L (ref 40–150)
BUN: 5.5 mg/dL — ABNORMAL LOW (ref 7.0–26.0)
CO2: 24 mEq/L (ref 22–29)
CREATININE: 0.9 mg/dL (ref 0.7–1.3)
Calcium: 9.3 mg/dL (ref 8.4–10.4)
Chloride: 106 mEq/L (ref 98–109)
GLUCOSE: 121 mg/dL (ref 70–140)
Potassium: 3.6 mEq/L (ref 3.5–5.1)
Sodium: 141 mEq/L (ref 136–145)
Total Bilirubin: 0.36 mg/dL (ref 0.20–1.20)
Total Protein: 7.5 g/dL (ref 6.4–8.3)

## 2013-10-18 LAB — CBC WITH DIFFERENTIAL/PLATELET
BASO%: 0.8 % (ref 0.0–2.0)
Basophils Absolute: 0 10*3/uL (ref 0.0–0.1)
EOS ABS: 0.1 10*3/uL (ref 0.0–0.5)
EOS%: 1.6 % (ref 0.0–7.0)
HEMATOCRIT: 42.6 % (ref 38.4–49.9)
HGB: 13.9 g/dL (ref 13.0–17.1)
LYMPH%: 21.5 % (ref 14.0–49.0)
MCH: 28.7 pg (ref 27.2–33.4)
MCHC: 32.6 g/dL (ref 32.0–36.0)
MCV: 87.8 fL (ref 79.3–98.0)
MONO#: 0.7 10*3/uL (ref 0.1–0.9)
MONO%: 11.9 % (ref 0.0–14.0)
NEUT#: 3.5 10*3/uL (ref 1.5–6.5)
NEUT%: 64.2 % (ref 39.0–75.0)
PLATELETS: 221 10*3/uL (ref 140–400)
RBC: 4.85 10*6/uL (ref 4.20–5.82)
RDW: 14.4 % (ref 11.0–14.6)
WBC: 5.5 10*3/uL (ref 4.0–10.3)
lymph#: 1.2 10*3/uL (ref 0.9–3.3)

## 2013-10-18 LAB — LACTATE DEHYDROGENASE (CC13): LDH: 150 U/L (ref 125–245)

## 2013-10-18 NOTE — Patient Instructions (Signed)
Benign Positional Vertigo Vertigo means you feel like you or your surroundings are moving when they are not. Benign positional vertigo is the most common form of vertigo. Benign means that the cause of your condition is not serious. Benign positional vertigo is more common in older adults. CAUSES  Benign positional vertigo is the result of an upset in the labyrinth system. This is an area in the middle ear that helps control your balance. This may be caused by a viral infection, head injury, or repetitive motion. However, often no specific cause is found. SYMPTOMS  Symptoms of benign positional vertigo occur when you move your head or eyes in different directions. Some of the symptoms may include:  Loss of balance and falls.  Vomiting.  Blurred vision.  Dizziness.  Nausea.  Involuntary eye movements (nystagmus). DIAGNOSIS  Benign positional vertigo is usually diagnosed by physical exam. If the specific cause of your benign positional vertigo is unknown, your caregiver may perform imaging tests, such as magnetic resonance imaging (MRI) or computed tomography (CT). TREATMENT  Your caregiver may recommend movements or procedures to correct the benign positional vertigo. Medicines such as meclizine, benzodiazepines, and medicines for nausea may be used to treat your symptoms. In rare cases, if your symptoms are caused by certain conditions that affect the inner ear, you may need surgery. HOME CARE INSTRUCTIONS   Follow your caregiver's instructions.  Move slowly. Do not make sudden body or head movements.  Avoid driving.  Avoid operating heavy machinery.  Avoid performing any tasks that would be dangerous to you or others during a vertigo episode.  Drink enough fluids to keep your urine clear or pale yellow. SEEK IMMEDIATE MEDICAL CARE IF:   You develop problems with walking, weakness, numbness, or using your arms, hands, or legs.  You have difficulty speaking.  You develop  severe headaches.  Your nausea or vomiting continues or gets worse.  You develop visual changes.  Your family or friends notice any behavioral changes.  Your condition gets worse.  You have a fever.  You develop a stiff neck or sensitivity to light. MAKE SURE YOU:   Understand these instructions.  Will watch your condition.  Will get help right away if you are not doing well or get worse. Document Released: 04/29/2006 Document Revised: 10/14/2011 Document Reviewed: 04/11/2011 [facility] Patient Information 2014 [place]. Dizziness Dizziness is a common problem. It is a feeling of unsteadiness or lightheadedness. You may feel like you are about to faint. Dizziness can lead to injury if you stumble or fall. A person of any age group can suffer from dizziness, but dizziness is more common in older adults. CAUSES  Dizziness can be caused by many different things, including:  Middle ear problems.  Standing for too long.  Infections.  An allergic reaction.  Aging.  An emotional response to something, such as the sight of blood.  Side effects of medicines.  Fatigue.  Problems with circulation or blood pressure.  Excess use of alcohol, medicines, or illegal drug use.  Breathing too fast (hyperventilation).  An arrhythmia or problems with your heart rhythm.  Low red blood cell count (anemia).  Pregnancy.  Vomiting, diarrhea, fever, or other illnesses that cause dehydration.  Diseases or conditions such as Parkinson's disease, high blood pressure (hypertension), diabetes, and thyroid problems.  Exposure to extreme heat. DIAGNOSIS  To find the cause of your dizziness, your caregiver may do a physical exam, lab tests, radiologic imaging scans, or an electrocardiography test (ECG).  TREATMENT  °Treatment of dizziness depends on the cause of your symptoms and can vary greatly. °HOME CARE INSTRUCTIONS  °· Drink enough fluids to keep your urine clear or pale  yellow. This is especially important in very hot weather. In the elderly, it is also important in cold weather. °· If your dizziness is caused by medicines, take them exactly as directed. When taking blood pressure medicines, it is especially important to get up slowly. °· Rise slowly from chairs and steady yourself until you feel okay. °· In the morning, first sit up on the side of the bed. When this seems okay, stand slowly while holding onto something until you know your balance is fine. °· If you need to stand in one place for a long time, be sure to move your legs often. Tighten and relax the muscles in your legs while standing. °· If dizziness continues to be a problem, have someone stay with you for a day or two. Do this until you feel you are well enough to stay alone. Have the person call your caregiver if he or she notices changes in you that are concerning. °· Do not drive or use heavy machinery if you feel dizzy. °· Do not drink alcohol. °SEEK IMMEDIATE MEDICAL CARE IF:  °· Your dizziness or lightheadedness gets worse. °· You feel nauseous or vomit. °· You develop problems with talking, walking, weakness, or using your arms, hands, or legs. °· You are not thinking clearly or you have difficulty forming sentences. It may take a friend or family member to determine if your thinking is normal. °· You develop chest pain, abdominal pain, shortness of breath, or sweating. °· Your vision changes. °· You notice any bleeding. °· You have side effects from medicine that seems to be getting worse rather than better. °MAKE SURE YOU:  °· Understand these instructions. °· Will watch your condition. °· Will get help right away if you are not doing well or get worse. °Document Released: 01/15/2001 Document Revised: 10/14/2011 Document Reviewed: 02/08/2011 °[employer]® Patient Information ©2014 [employer]. ° °

## 2013-10-18 NOTE — Telephone Encounter (Signed)
gv pt appt schedule for sept °

## 2013-10-18 NOTE — Progress Notes (Signed)
Checked in new pt with no financial concerns. °

## 2013-10-21 NOTE — Progress Notes (Signed)
[place] [phone]   [phone]  NEW PATIENT EVALUATION   Name: Devon Barron Date: 10/21/2013 MRN: [id number] DOB: 02-10-1961  PCP: Scarlette Calico, MD   REFERRING PHYSICIAN: Janith Lima, MD  REASON FOR REFERRAL: Questionable bone marrow disorder    HISTORY OF PRESENT ILLNESS:Devon Barron is a 53 y.o. male who is being referred to our office for further evaluation of a questionable bone marrow problem.  Today, he is accompanied by his son Devon Barron.  He was last seen by his PCP Dr. Ronnald Ramp on 10/14/2013 for follow up for his neck pain which at that time he had reported substantial improvement.  However, his MRI of his C-spine (10/10/2013) showed concern for diffuse loss of normal bone marrow signal which is nonspecific and can be seen in both benign and malignant conditions. His labs on 09/03/13 revealed that he was negative for HCV ab, a CMP within normal limits, a TSH of 1.04, with a CBC revealing WBC of 5.2, Hemoglobin 14.1, platelets of 179 with relative monocytes of 14.6.  His prior CBCs over the past several months have been within normal limits. PSA was 2.27.    He takes interferon 44 mcg tid for his multiple sclerosis (MS) but went off it in June and restarted it September 2014.  He reports excellent control of his MS on this regiment.  It is being managed by his neurologist Dr. Tana Felts.  He takes naproxen 500 mg bid.  He denies any falls.  He had a colonoscopy in February of this year that showed no polyps, but hemorrhoids.  He father passed away from colon cancer at age 35.  He denies any recurrent infections, weight loss, night sweats, painful lymph nodes or bone pain.  His energy has been fairly stable.    PAST MEDICAL HISTORY:  has a past medical history of MS (multiple sclerosis).     PAST SURGICAL HISTORY: Past Surgical History  Procedure Laterality Date  . Hernia repair       CURRENT MEDICATIONS: has a current medication list  which includes the following prescription(s): alprazolam, cholecalciferol, dexlansoprazole, interferon beta-1a, levitra, meclizine, and naproxen.   ALLERGIES: Review of patient's allergies indicates no known allergies.   SOCIAL HISTORY:  reports that he has never smoked. He has never used smokeless tobacco. He reports that he drinks about 3.0 ounces of alcohol per week. He reports that he does not use illicit drugs.   FAMILY HISTORY: family history includes Diabetes in his brother and sister; Hypertension in his father and mother. There is no history of Ataxia, Chorea, Dementia, Mental retardation, Migraines, Multiple sclerosis, Neurofibromatosis, Neuropathy, Parkinsonism, Seizures, Stroke, Early death, Cancer, Alcohol abuse, Hearing loss, Heart disease, Hyperlipidemia, or Kidney disease.   LABORATORY DATA:  CBC    Component Value Date/Time   WBC 5.5 10/18/2013 1356   WBC 5.2 09/03/2013 1611   RBC 4.85 10/18/2013 1356   RBC 4.98 09/03/2013 1611   HGB 13.9 10/18/2013 1356   HGB 14.1 09/03/2013 1611   HCT 42.6 10/18/2013 1356   HCT 44.2 09/03/2013 1611   PLT 221 10/18/2013 1356   PLT 179.0 09/03/2013 1611   MCV 87.8 10/18/2013 1356   MCV 88.7 09/03/2013 1611   MCH 28.7 10/18/2013 1356   MCH 28.8 07/23/2013 1020   MCHC 32.6 10/18/2013 1356   MCHC 32.0 09/03/2013 1611   RDW 14.4 10/18/2013 1356   RDW 13.9 09/03/2013 1611   LYMPHSABS 1.2 10/18/2013 1356   LYMPHSABS  1.2 09/03/2013 1611   MONOABS 0.7 10/18/2013 1356   MONOABS 0.8 09/03/2013 1611   EOSABS 0.1 10/18/2013 1356   EOSABS 0.0 09/03/2013 1611   BASOSABS 0.0 10/18/2013 1356   BASOSABS 0.0 09/03/2013 1611    CMP     Component Value Date/Time   NA 141 10/18/2013 1356   NA 139 09/03/2013 1611   K 3.6 10/18/2013 1356   K 3.5 09/03/2013 1611   CL 104 09/03/2013 1611   CO2 24 10/18/2013 1356   CO2 28 09/03/2013 1611   GLUCOSE 121 10/18/2013 1356   GLUCOSE 87 09/03/2013 1611   BUN 5.5* 10/18/2013 1356   BUN 9 09/03/2013 1611   CREATININE 0.9  10/18/2013 1356   CREATININE 0.9 09/03/2013 1611   CALCIUM 9.3 10/18/2013 1356   CALCIUM 9.4 09/03/2013 1611   PROT 7.5 10/18/2013 1356   PROT 7.2 09/03/2013 1611   ALBUMIN 3.8 10/18/2013 1356   ALBUMIN 4.1 09/03/2013 1611   AST 16 10/18/2013 1356   AST 16 09/03/2013 1611   ALT 27 10/18/2013 1356   ALT 22 09/03/2013 1611   ALKPHOS 71 10/18/2013 1356   ALKPHOS 48 09/03/2013 1611   BILITOT 0.36 10/18/2013 1356   BILITOT 0.7 09/03/2013 1611   GFRNONAA >90 07/23/2013 1020   GFRAA >90 07/23/2013 1020   Results for Devon Barron, Devon Barron (MRN [id number]) as of 10/21/2013 06:45  Ref. Range 09/03/2013 16:11  PSA Latest Range: 0.10-4.00 ng/mL 2.27     RADIOGRAPHY: Ct Head Wo Contrast  09/29/2013   CLINICAL DATA:  Fall  EXAM: CT HEAD WITHOUT CONTRAST  TECHNIQUE: Contiguous axial images were obtained from the base of the skull through the vertex without intravenous contrast.  COMPARISON:  Prior MRI from 07/13/2013  FINDINGS: Scattered and confluent hypodensity within the periventricular and deep white matter is compatible with history of demyelinating disease, better evaluated on prior MRI.  There is no acute intracranial hemorrhage or infarct. No mass lesion or midline shift. Gray-white matter differentiation is well maintained. Ventricles are normal in size without evidence of hydrocephalus. CSF containing spaces are within normal limits. No extra-axial fluid collection.  The calvarium is intact.  Orbital soft tissues are within normal limits.  The paranasal sinuses and mastoid air cells are well pneumatized and free of fluid.  Scalp soft tissues are unremarkable.  IMPRESSION: 1. No acute intracranial abnormality. 2. Scattered and confluent white matter hypodensities, compatible with history of underlying demyelinating disease. Findings are better evaluated on prior MRI from 07/13/2013.   Electronically Signed   By: Jeannine Boga M.D.   On: 09/29/2013 21:58   Mr Cervical Spine Wo Contrast  10/10/2013   CLINICAL  DATA:  Chronic neck pain for several years, worse in the last 3 months. History multiple sclerosis.  EXAM: MRI CERVICAL SPINE WITHOUT CONTRAST  TECHNIQUE: Multiplanar, multisequence MR imaging was performed. No intravenous contrast was administered.  COMPARISON:  No comparisons  FINDINGS: Vertebral alignment is normal. Vertebral bone marrow signal is mildly diminished diffusely. There is no evidence of bone marrow edema. Patchy T2 hyperintensities are partially visualized in the brainstem and the left middle cerebellar peduncle. A T2 hyperintense lesion is present posteriorly in the spinal cord at C2-3. A punctate T2 hyperintense lesion is also present posteriorly in the cord slightly right of midline at C6-7. The cervical spinal cord is normal in caliber. The visualized paraspinal soft tissues are unremarkable.  C2-3:  Negative.  C3-4:  Minimal uncovertebral hypertrophy without stenosis.  C4-5: Minimal left greater  than right neural foraminal narrowing due to uncovertebral hypertrophy.  C5-6:  Negative.  C6-7:  Negative.  C7-T1:  Negative.  IMPRESSION: 1. T2 hyperintense cord lesions at C2-3 and C6-7, consistent with history of multiple sclerosis. 2. Minimal degenerative disc disease without significant stenosis. 3. Diffuse loss of normal bone marrow signal is nonspecific and can be seen in both benign and malignant conditions (anemia with red marrow reactivation, infiltrative/myelofibrotic marrow processes, or metastatic disease).   Electronically Signed   By: Logan Bores   On: 10/10/2013 16:18     REVIEW OF SYSTEMS:  Constitutional: Denies fevers, chills or abnormal weight loss; positive for neck stiffness.  Eyes: Denies blurriness of vision Ears, nose, mouth, throat, and face: Denies mucositis or sore throat Respiratory: Denies cough, dyspnea or wheezes Cardiovascular: Denies palpitation, chest discomfort or lower extremity swelling Gastrointestinal:  Denies nausea, heartburn or change in bowel  habits Skin: Denies abnormal skin rashes Lymphatics: Denies new lymphadenopathy or easy bruising Neurological:Denies numbness, tingling or new weaknesses Behavioral/Psych: Mood is stable, no new changes  All other systems were reviewed with the patient and are negative.  PHYSICAL EXAM:  height is 5' 8.5" (1.74 m) and weight is 204 lb 3.2 oz (92.625 kg). His oral temperature is 98 F (36.7 C). His blood pressure is 144/87 and his pulse is 81. His respiration is 18 and oxygen saturation is 99%.    GENERAL:alert, no distress and comfortable SKIN: skin color, texture, turgor are normal, no rashes or significant lesions EYES: normal, Conjunctiva are pink and non-injected, sclera clear OROPHARYNX:no exudate, no erythema and lips, buccal mucosa, and tongue normal  NECK: supple, thyroid normal size, non-tender, without nodularity LYMPH:  no palpable lymphadenopathy in the cervical, axillary or inguinal LUNGS: clear to auscultation and percussion with normal breathing effort HEART: regular rate & rhythm and no murmurs and no lower extremity edema ABDOMEN:abdomen soft, non-tender and normal bowel sounds Musculoskeletal:no cyanosis of digits and no clubbing; neck with good range of motion.  NEURO: alert & oriented x 3 with fluent speech, no focal motor/sensory deficits   IMPRESSION: Devon Barron is a 53 y.o. male with a history of   PLAN:  1.  Decreased bone marrow signal NOS.  --Most common malignancy associated with decreased bone marrow signal is Multiple myeloma per findings from a recent study and 5% of patients with decreased bone marrow signal per MRI had subsequent malignancy.  (Gunjan L et. Al, [employer], Volume 2014). Hemangiomas account for the most common benign findings.    --We will obtain a screening SPEP and UPEP and if positive will request a skeletal bone survey.  A bone marrow is not warranted presently given a lack of lab abnormalities. His calcium,  hemoglobin and creatinine is all within normal limits.  His PSA and TSH was within normal limits.  His colonoscopy is up-to-date.  Other considerations would be related to the interferon beta.  Not sure how this affects the bone marrow signal. He has no obvious cytopenias related to its use.    2. Relapsing, remitting MS --Continue current management per neurology.   3. Neck stiffness.  --Continue aggressive physical therapy and pain control.  He is being monitored closely by his PCP.   All questions were answered. The patient knows to call the clinic with any problems, questions or concerns. We can certainly see the patient much sooner if necessary.  I spent 25 minutes counseling the patient face to face. The total time spent in  the appointment was 45 minutes.    Amad Mau, MD 10/21/2013 6:12 AM

## 2013-10-25 ENCOUNTER — Telehealth: Payer: Self-pay | Admitting: Internal Medicine

## 2013-10-25 NOTE — Telephone Encounter (Signed)
, °

## 2013-10-27 ENCOUNTER — Other Ambulatory Visit: Payer: BC Managed Care – PPO

## 2013-10-27 ENCOUNTER — Other Ambulatory Visit (HOSPITAL_BASED_OUTPATIENT_CLINIC_OR_DEPARTMENT_OTHER): Payer: BC Managed Care – PPO

## 2013-10-27 DIAGNOSIS — D759 Disease of blood and blood-forming organs, unspecified: Secondary | ICD-10-CM

## 2013-10-29 LAB — SPEP & IFE WITH QIG
ALPHA-1-GLOBULIN: 3.7 % (ref 2.9–4.9)
Albumin ELP: 58.8 % (ref 55.8–66.1)
Alpha-2-Globulin: 7.1 % (ref 7.1–11.8)
BETA 2: 5.2 % (ref 3.2–6.5)
Beta Globulin: 5.7 % (ref 4.7–7.2)
GAMMA GLOBULIN: 19.5 % — AB (ref 11.1–18.8)
IGG (IMMUNOGLOBIN G), SERUM: 1580 mg/dL (ref 650–1600)
IgA: 356 mg/dL (ref 68–379)
IgM, Serum: 121 mg/dL (ref 41–251)
Total Protein, Serum Electrophoresis: 7.1 g/dL (ref 6.0–8.3)

## 2013-11-02 LAB — UIFE/LIGHT CHAINS/TP QN, 24-HR UR
Albumin, U: DETECTED
FREE LT CHN EXCR RATE: 22.25 mg/d
Free Kappa Lt Chains,Ur: 0.89 mg/dL (ref 0.14–2.42)
Free Kappa/Lambda Ratio: 12.71 ratio — ABNORMAL HIGH (ref 2.04–10.37)
Free Lambda Excretion/Day: 1.75 mg/d
Free Lambda Lt Chains,Ur: 0.07 mg/dL (ref 0.02–0.67)
TOTAL PROTEIN, URINE-UR/DAY: 38 mg/d (ref 10–140)
Time: 24 hours
Total Protein, Urine: 1.5 mg/dL
Volume, Urine: 2500 mL

## 2013-11-03 ENCOUNTER — Telehealth: Payer: Self-pay | Admitting: Neurology

## 2013-11-03 ENCOUNTER — Telehealth: Payer: Self-pay | Admitting: *Deleted

## 2013-11-03 NOTE — Telephone Encounter (Signed)
Patient called about prior auth on his Rebif I have not received any information requesting prior auth . I called  RX spoke with Baxter Flattery she states it has been denied at this time for  DX code  But felt sure they would move forward with  Refilling it Tara's number is [phone]

## 2013-11-03 NOTE — Telephone Encounter (Signed)
Pt needs to talk to you about rx  [phone]

## 2013-11-10 ENCOUNTER — Telehealth: Payer: Self-pay | Admitting: Neurology

## 2013-11-10 NOTE — Telephone Encounter (Signed)
Pt called requesting to speak to a nurse regarding his medication refill.

## 2013-11-17 ENCOUNTER — Telehealth: Payer: Self-pay | Admitting: Neurology

## 2013-11-17 NOTE — Telephone Encounter (Signed)
Pt needs to talk to someone about rx please call [phone]

## 2013-11-18 NOTE — Telephone Encounter (Signed)
kiarea lassister called and said that they we shipping him a free month of medication and and it will be over nighted so he should have it on sat. All of the confusion should be taken care of by tomorrow.

## 2013-11-23 NOTE — Telephone Encounter (Signed)
Pt called wanting to let you know that his medication arrived.

## 2013-11-30 ENCOUNTER — Encounter: Payer: Self-pay | Admitting: Internal Medicine

## 2013-12-22 ENCOUNTER — Telehealth: Payer: Self-pay | Admitting: Neurology

## 2013-12-22 NOTE — Telephone Encounter (Signed)
Pt called requesting to speak to a nurse regarding his medication. Pt is having a hard time obtaining his refill. Please call pt.

## 2013-12-22 NOTE — Telephone Encounter (Signed)
I gave patient  [facility] [employer] number to assist with medication he ran out Sunday 17 2015. I also made a follow up appt for him since it has been greater than 6 months since he was last seen. 12/28/13 1pm appt time

## 2013-12-28 ENCOUNTER — Ambulatory Visit (INDEPENDENT_AMBULATORY_CARE_PROVIDER_SITE_OTHER): Payer: BC Managed Care – PPO | Admitting: Neurology

## 2013-12-28 ENCOUNTER — Encounter: Payer: Self-pay | Admitting: Neurology

## 2013-12-28 VITALS — BP 106/68 | HR 68 | Temp 98.2°F | Resp 18 | Ht 68.0 in | Wt 205.2 lb

## 2013-12-28 DIAGNOSIS — G35 Multiple sclerosis: Secondary | ICD-10-CM

## 2013-12-28 NOTE — Patient Instructions (Signed)
1.  Continue Rebif as prescribed. 2.  Continue D3 4000 units daily 3.  Will check vitamin D level 4.  Repeat MRI of brain with and without contrast in 3 months and follow up with me soon after.

## 2013-12-28 NOTE — Progress Notes (Signed)
NEUROLOGY FOLLOW UP OFFICE NOTE  Devon Barron [id number]  HISTORY OF PRESENT ILLNESS: Devon Barron is a 53 year old right-handed man with possible OSA, who follows up for multiple sclerosis and dizziness.  Records and images were personally reviewed.    UPDATE:   Since last visit, he has missed some doses of Rebif.  He missed a couple of doses last week.  Last month he went two weeks without it before able to get a refill.  Since last visit, he has been evaluated by oncology for loss of bone marrow signal on MRI of the cervical spine performed 10/11/13 for neck pain.  Work up is pending.  Otherwise, he is feeling well.  08/20/13:  IFN-b neutralizing antibody test negative. 10/18/13 LABS:  WBC 5.5, HGB 13.9, HCT 42.6, PLT 221, Na 141, K 3.6, Cl 106, bicarb 24, glucose 121, BUN 5.5, Cr 0.9, TB 0.36, Alk Phos 71, AST 16, ALT 27, TP 7.5, ALB 3.8, Ca 9.3  HISTORY: He was diagnosed with MS in 2001.  At that time, he presented with left facial myokymia. He reports that he hasn't had many flare ups, and only a couple of flare-ups requiring pulse steroids.  They typically present as blurred vision, headache, dizziness, unsteadiness and fatigue.  He has both supratentorial and infratentorial demyelinating lesions.  His cervical cord is okay.  He has a history of lapses with physician follow-ups and non-adherence to disease-modifying agents, mostly due to financial reasons.  He previously was on Avonex, and then Rebif.  In the past, neutralizing antibodies have been negative.  Due to financial reasons, he has not followed up with a neurologist in 1.5 years and had not taken his Rebif since May.  Since May, he developed increased fatigue, stiffness, blurred vision and intermittent headaches, similar to prior flare ups.  He presented to the ED on 05/03/13.  Physical exam revealed slight right sided facial droop.  Initially, a CT of the head was performed and revealed decreased attenuation at the right  subfrontal-anterior temporal junction.  MRI Brain w/wo contrast was performed, and revealed extensive periventricular and subcortical T2 hyperintensities, as well as focal restricted diffusion within the splenium of the corpus callosum, concerning for active demyelination.  It was recommended that he be admitted for IV steroids, but patient declined.  He was discharged on prednisone taper, which helped with his walking.  I first saw him in October 2014.  At that time, he exhibited right lower facial weakness, trace weakness in left tricep, mild difficulty with tandem walking and mild sway on Romberg.  We restarted his Rebif.  His vitamin D level was very low, at 18.  I recommended starting D3 4000 IU daily.   PAST MEDICAL HISTORY: Past Medical History  Diagnosis Date  . MS (multiple sclerosis)     MEDICATIONS: Current Outpatient Prescriptions on File Prior to Visit  Medication Sig Dispense Refill  . ALPRAZolam (XANAX) 0.5 MG tablet Take 1 tablet (0.5 mg total) by mouth 2 (two) times daily as needed (dizzy).  10 tablet  0  . cholecalciferol (VITAMIN D) 1000 UNITS tablet Take 1,000 Units by mouth daily.      Marland Kitchen dexlansoprazole (DEXILANT) 60 MG capsule Take 1 capsule (60 mg total) by mouth daily.  90 capsule  3  . interferon beta-1a (REBIF) 44 MCG/0.5ML injection Inject 44 mcg into the skin 3 (three) times a week.      Marland Kitchen LEVITRA 20 MG tablet       .  meclizine (ANTIVERT) 50 MG tablet Take 1 tablet (50 mg total) by mouth 3 (three) times daily as needed for dizziness.  30 tablet  0  . naproxen (NAPROSYN) 500 MG tablet Take 1 tablet (500 mg total) by mouth 2 (two) times daily.  30 tablet  0   No current facility-administered medications on file prior to visit.    ALLERGIES: No Known Allergies  FAMILY HISTORY: Family History  Problem Relation Age of Onset  . Ataxia Neg Hx   . Chorea Neg Hx   . Dementia Neg Hx   . Mental retardation Neg Hx   . Migraines Neg Hx   . Multiple sclerosis Neg Hx     . Neurofibromatosis Neg Hx   . Neuropathy Neg Hx   . Parkinsonism Neg Hx   . Seizures Neg Hx   . Stroke Neg Hx   . Early death Neg Hx   . Cancer Neg Hx   . Alcohol abuse Neg Hx   . Hearing loss Neg Hx   . Heart disease Neg Hx   . Hyperlipidemia Neg Hx   . Kidney disease Neg Hx   . Hypertension Mother   . Hypertension Father   . Diabetes Sister   . Diabetes Brother     SOCIAL HISTORY: History   Social History  . Marital Status: Single    Spouse Name: N/A    Number of Children: N/A  . Years of Education: N/A   Occupational History  . Not on file.   Social History Main Topics  . Smoking status: Never Smoker   . Smokeless tobacco: Never Used  . Alcohol Use: 3.0 oz/week    5 Cans of beer per week     Comment: Occ  . Drug Use: No  . Sexual Activity: Not Currently   Other Topics Concern  . Not on file   Social History Narrative  . No narrative on file    REVIEW OF SYSTEMS: Constitutional: No fevers, chills, or sweats, no generalized fatigue, change in appetite Eyes: No visual changes, double vision, eye pain Ear, nose and throat: No hearing loss, ear pain, nasal congestion, sore throat Cardiovascular: No chest pain, palpitations Respiratory:  No shortness of breath at rest or with exertion, wheezes GastrointestinaI: No nausea, vomiting, diarrhea, abdominal pain, fecal incontinence Genitourinary:  No dysuria, urinary retention or frequency Musculoskeletal:  No neck pain, back pain Integumentary: No rash, pruritus, skin lesions Neurological: as above Psychiatric: No depression, insomnia, anxiety Endocrine: No palpitations, fatigue, diaphoresis, mood swings, change in appetite, change in weight, increased thirst Hematologic/Lymphatic:  No anemia, purpura, petechiae. Allergic/Immunologic: no itchy/runny eyes, nasal congestion, recent allergic reactions, rashes  PHYSICAL EXAM: Filed Vitals:   12/28/13 1300  BP: 106/68  Pulse: 68  Temp: 98.2 F (36.8 C)   Resp: 18   General: No acute distress Head:  Normocephalic/atraumatic Neck: supple, no paraspinal tenderness, full range of motion Heart:  Regular rate and rhythm Lungs:  Clear to auscultation bilaterally Back: No paraspinal tenderness Neurological Exam: alert and oriented to person, place, and time. Attention span and concentration intact, recent and remote memory intact, fund of knowledge intact.  Speech fluent and not dysarthric, language intact.  CN II-XII intact. Fundoscopic exam unremarkable without vessel changes, exudates, hemorrhages or papilledema.  Bulk and tone normal, muscle strength 5/5 throughout.  Sensation to light touch, temperature and vibration intact.  Deep tendon reflexes 2+ throughout, toes downgoing.  Finger to nose and heel to shin testing intact.  Gait station  and stride, able to walk in tandem, Romberg negative.  IMPRESSION: Relapsing-remitting MS  PLAN: 1.  Rebif.  Take as prescribed and to ensure not to miss doses 2.  Check vitamin D level. 3.  Continue D3 4000 IU daily 4.  MRI of brain with and without contrast in 3 months with follow up soon after.  Metta Clines, DO  CC:  Scarlette Calico, MD

## 2014-02-15 ENCOUNTER — Encounter: Payer: Self-pay | Admitting: Internal Medicine

## 2014-02-15 ENCOUNTER — Other Ambulatory Visit (INDEPENDENT_AMBULATORY_CARE_PROVIDER_SITE_OTHER): Payer: BC Managed Care – PPO

## 2014-02-15 ENCOUNTER — Ambulatory Visit (INDEPENDENT_AMBULATORY_CARE_PROVIDER_SITE_OTHER): Payer: BC Managed Care – PPO | Admitting: Internal Medicine

## 2014-02-15 VITALS — BP 140/86 | HR 79 | Temp 98.2°F | Wt 199.2 lb

## 2014-02-15 DIAGNOSIS — M79609 Pain in unspecified limb: Secondary | ICD-10-CM

## 2014-02-15 DIAGNOSIS — G35 Multiple sclerosis: Secondary | ICD-10-CM

## 2014-02-15 DIAGNOSIS — M79652 Pain in left thigh: Secondary | ICD-10-CM

## 2014-02-15 DIAGNOSIS — G35A Relapsing-remitting multiple sclerosis: Secondary | ICD-10-CM

## 2014-02-15 LAB — CK: Total CK: 102 U/L (ref 7–232)

## 2014-02-15 NOTE — Progress Notes (Signed)
Pre visit review using our clinic review tool, if applicable. No additional management support is needed unless otherwise documented below in the visit note. 

## 2014-02-15 NOTE — Progress Notes (Signed)
   Subjective:    Patient ID: Devon Barron, male    DOB: 08-Sep-1960, 53 y.o.   MRN: [id number]  HPI  He describes discomfort in the left inferior thigh and knee area since 02/03/14. There was no trigger or predisposition; however he did see spiders in his bedroom and was concerned about spider bite  The symptoms are described as stiffness, aching, numbness, and swelling in this area which progressed from 7/2-7/5. It was as severe as a level X; now it is improved to a level IV.  He's also had some night sweats and has felt hot. He did not actually taken his temperature. He describes some chilling.  He also says of urinary frequency  Ice and Tylenol been of partial benefit.      Review of Systems  There is no change in color or temperature area of discomfort  He's had no dysuria or pyuria  He also has urinary or stool incontinence.  He did not have associated severe abdominal pain, nausea vomiting, chest pain, shortness of breath, or hemoptysis.     Objective:   Physical Exam   Significant or distinguishing  findings on physical exam are documented first.  Below that are other systems examined & findings.   There is slight facial asymmetry. He has intermittent stuttering speech pattern There is a 1 cm x 2 mm history of type lesion of the left inferior thigh. There is no associated cellulitis or purulence There is increased tone and some rigidity with range of motion testing; but there is no deficit at the knee.  General appearance is one of good health and nourishment w/o distress. Eyes: No conjunctival inflammation or scleral icterus is present. Heart:  Normal rate and regular rhythm. S1 and S2 normal without gallop, murmur, click, rub or other extra sounds Lungs:Chest clear to auscultation; no wheezes, rhonchi,rales ,or rubs present.No increased work of breathing.  Abdomen: bowel sounds normal, soft and non-tender without masses, organomegaly or hernias noted.  No guarding  or rebound . Dullness LUQ Musculoskeletal: Able to lie flat and sit up without help. Negative straight leg raising bilaterally. Gait normal Skin:Warm & dry.  Intact without suspicious lesions or rashes ; no jaundice or tenting Lymphatic: No lymphadenopathy is noted about the head, neck, axilla.                Assessment & Plan:  #1 thigh & knee pai, MS etiology suggested #2 no suggestion of spider bite See orders

## 2014-02-15 NOTE — Patient Instructions (Addendum)
Use an anti-inflammatory cream such as Aspercreme or Zostrix cream twice a day to the affected area as needed. In lieu of this warm moist compresses or  hot water bottle can be used. Do not apply ice .  Your next office appointment will be determined based upon review of your pending labs. Those instructions will be transmitted to you through My Chart  OR  by mail;whichever process is your choice to receive results & recommendations .

## 2014-02-16 LAB — D-DIMER, QUANTITATIVE: D-Dimer, Quant: 0.27 ug/mL-FEU (ref 0.00–0.48)

## 2014-03-08 ENCOUNTER — Telehealth: Payer: Self-pay | Admitting: Family Medicine

## 2014-03-08 ENCOUNTER — Ambulatory Visit (HOSPITAL_COMMUNITY): Admission: RE | Admit: 2014-03-08 | Payer: BC Managed Care – PPO | Source: Ambulatory Visit

## 2014-03-08 ENCOUNTER — Ambulatory Visit (HOSPITAL_COMMUNITY)
Admission: RE | Admit: 2014-03-08 | Discharge: 2014-03-08 | Disposition: A | Discharge status: Home / Self Care | Payer: BC Managed Care – PPO | Source: Ambulatory Visit | Attending: Neurology | Admitting: Neurology

## 2014-03-08 DIAGNOSIS — G35 Multiple sclerosis: Secondary | ICD-10-CM | POA: Insufficient documentation

## 2014-03-08 MED ORDER — GADOBENATE DIMEGLUMINE 529 MG/ML IV SOLN
20.0000 mL | Freq: Once | INTRAVENOUS | Status: AC | PRN
  Administered 2014-03-08: 20 mL via INTRAVENOUS

## 2014-03-08 NOTE — Telephone Encounter (Signed)
Message copied by Thurmon Fair on Tue Mar 08, 2014  4:24 PM ------      Message from: Alda Berthold      Created: Tue Mar 08, 2014  4:14 PM       Please notify patient MRI brain is stable without any new lesions.  Thank you.       ------

## 2014-03-08 NOTE — Telephone Encounter (Signed)
Patient notified of results.

## 2014-03-21 DIAGNOSIS — A64 Unspecified sexually transmitted disease: Secondary | ICD-10-CM | POA: Insufficient documentation

## 2014-03-21 DIAGNOSIS — A6002 Herpesviral infection of other male genital organs: Secondary | ICD-10-CM | POA: Insufficient documentation

## 2014-03-21 DIAGNOSIS — N529 Male erectile dysfunction, unspecified: Secondary | ICD-10-CM | POA: Insufficient documentation

## 2014-03-30 ENCOUNTER — Ambulatory Visit: Payer: BC Managed Care – PPO | Admitting: Neurology

## 2014-04-05 ENCOUNTER — Ambulatory Visit (INDEPENDENT_AMBULATORY_CARE_PROVIDER_SITE_OTHER): Payer: BC Managed Care – PPO | Admitting: Neurology

## 2014-04-05 ENCOUNTER — Encounter: Payer: Self-pay | Admitting: Neurology

## 2014-04-05 VITALS — BP 130/76 | HR 102 | Temp 98.0°F | Resp 18 | Wt 197.7 lb

## 2014-04-05 DIAGNOSIS — G35 Multiple sclerosis: Secondary | ICD-10-CM

## 2014-04-05 NOTE — Progress Notes (Signed)
NEUROLOGY FOLLOW UP OFFICE NOTE  Devon Barron [id number]  HISTORY OF PRESENT ILLNESS: Devon Barron is a 53 year old right-handed man with possible OSA, who follows up for multiple sclerosis and dizziness.  Records and images were personally reviewed.    UPDATE:    Follow up MRI of the brain with and without contrast was performed on 03/08/14, which revealed no new demyelinating plaques compared to previous study from 07/13/13.  Things have been stable.  For a little while, he noted an aching numb pain in his left anterior thigh.  This has slowly improved.  From time to time, he notes stiffness and tingling of his right hand.  It doesn't happen with any particular activity and he denies neck pain.  HISTORY: He was diagnosed with MS in 2001.  At that time, he presented with left facial myokymia. He reports that he hasn't had many flare ups, and only a couple of flare-ups requiring pulse steroids.  They typically present as blurred vision, headache, dizziness, unsteadiness and fatigue.  He has both supratentorial and infratentorial demyelinating lesions.  His cervical cord is okay.  He has a history of lapses with physician follow-ups and non-adherence to disease-modifying agents, mostly due to financial reasons.  He previously was on Avonex, and then Rebif.  In the past, neutralizing antibodies have been negative.  Due to financial reasons, he has not followed up with a neurologist in between 2013 and 2014 and had not taken his Rebif.  He developed increased fatigue, stiffness, blurred vision and intermittent headaches, similar to prior flare ups.  He presented to the ED on 05/03/13.  Physical exam revealed slight right sided facial droop.  Initially, a CT of the head was performed and revealed decreased attenuation at the right subfrontal-anterior temporal junction.  MRI Brain w/wo contrast was performed, and revealed extensive periventricular and subcortical T2 hyperintensities, as well as focal  restricted diffusion within the splenium of the corpus callosum, concerning for active demyelination.  It was recommended that he be admitted for IV steroids, but patient declined.  He was discharged on prednisone taper, which helped with his walking.  I first saw him in October 2014.  At that time, he exhibited right lower facial weakness, trace weakness in left tricep, mild difficulty with tandem walking and mild sway on Romberg.  He restarted his Rebif.  Chronic symptoms include mild fatigue, but if he gets 10 hours of sleep, he feels good.  He notes occasional urinary frequency but noting too concerning.  Transient symptoms include weakness, blurred vision and headache.  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  08/20/13:  IFN-b neutralizing antibody test negative. 10/18/13 LABS:  WBC 5.5, HGB 13.9, HCT 42.6, PLT 221, Na 141, K 3.6, Cl 106, bicarb 24, glucose 121, BUN 5.5, Cr 0.9, TB 0.36, Alk Phos 71, AST 16, ALT 27, TP 7.5, ALB 3.8, Ca 9.3  PAST MEDICAL HISTORY: Past Medical History  Diagnosis Date  . MS (multiple sclerosis)     MEDICATIONS: Current Outpatient Prescriptions on File Prior to Visit  Medication Sig Dispense Refill  . cholecalciferol (VITAMIN D) 1000 UNITS tablet Take 1,000 Units by mouth daily.      . interferon beta-1a (REBIF) 44 MCG/0.5ML injection Inject 44 mcg into the skin 3 (three) times a week.      Marland Kitchen LEVITRA 20 MG tablet        No current facility-administered medications on file prior to visit.    ALLERGIES:  No Known Allergies  FAMILY HISTORY: Family History  Problem Relation Age of Onset  . Ataxia Neg Hx   . Chorea Neg Hx   . Dementia Neg Hx   . Mental retardation Neg Hx   . Migraines Neg Hx   . Multiple sclerosis Neg Hx   . Neurofibromatosis Neg Hx   . Neuropathy Neg Hx   . Parkinsonism Neg Hx   . Seizures Neg Hx   . Stroke Neg Hx   . Early death Neg Hx   . Cancer Neg Hx   . Alcohol abuse Neg Hx   . Hearing loss Neg  Hx   . Heart disease Neg Hx   . Hyperlipidemia Neg Hx   . Kidney disease Neg Hx   . Hypertension Mother   . Hypertension Father   . Diabetes Sister   . Diabetes Brother     SOCIAL HISTORY: History   Social History  . Marital Status: Single    Spouse Name: N/A    Number of Children: N/A  . Years of Education: N/A   Occupational History  . Not on file.   Social History Main Topics  . Smoking status: Never Smoker   . Smokeless tobacco: Never Used  . Alcohol Use: 3.0 oz/week    5 Cans of beer per week     Comment: Occ  . Drug Use: No  . Sexual Activity: Not Currently   Other Topics Concern  . Not on file   Social History Narrative  . No narrative on file    REVIEW OF SYSTEMS: Constitutional: No fevers, chills, or sweats, no generalized fatigue, change in appetite Eyes: No visual changes, double vision, eye pain Ear, nose and throat: No hearing loss, ear pain, nasal congestion, sore throat Cardiovascular: No chest pain, palpitations Respiratory:  No shortness of breath at rest or with exertion, wheezes GastrointestinaI: No nausea, vomiting, diarrhea, abdominal pain, fecal incontinence Genitourinary:  No dysuria, urinary retention or frequency Musculoskeletal:  No neck pain, back pain Integumentary: No rash, pruritus, skin lesions Neurological: as above Psychiatric: No depression, insomnia, anxiety Endocrine: No palpitations, fatigue, diaphoresis, mood swings, change in appetite, change in weight, increased thirst Hematologic/Lymphatic:  No anemia, purpura, petechiae. Allergic/Immunologic: no itchy/runny eyes, nasal congestion, recent allergic reactions, rashes  PHYSICAL EXAM: Filed Vitals:   04/05/14 1512  BP: 130/76  Pulse: 102  Temp: 98 F (36.7 C)  Resp: 18   General: No acute distress Head:  Normocephalic/atraumatic Neck: supple, no paraspinal tenderness, full range of motion Heart:  Regular rate and rhythm Lungs:  Clear to auscultation  bilaterally Back: No paraspinal tenderness Neurological Exam: alert and oriented to person, place, and time. Attention span and concentration intact, recent and remote memory intact, fund of knowledge intact.  Speech with stuttering but not dysarthric, language intact.  Mild right facial weakness, otherwise CN II-XII intact. Fundi not visualized.  Bulk and tone normal, muscle strength 5/5 throughout.  Sensation to light touch, temperature and vibration intact.  Deep tendon reflexes 2+ throughout, toes downgoing.  Finger to nose and heel to shin testing intact.  Gait station and stride normal with mildly reduced right arm swing, able to walk in tandem, Romberg negative.  IMPRESSION: Relpasing-remitting MS  PLAN: 1.  Rebif 2.  D3 1000 units daily 3.  Check CBC with diff, CMP and D level 4.  Follow up in 3 months.  Metta Clines, DO  CC:  Scarlette Calico, MD

## 2014-04-05 NOTE — Patient Instructions (Signed)
1.  Continue Rebif 2.  Continue D3 1000 units daily 3.  Check blood work, including CBC with diff, CMP and vitamin D level 4.  Follow up in 3 months.

## 2014-04-06 LAB — COMPREHENSIVE METABOLIC PANEL
ALBUMIN: 4.2 g/dL (ref 3.5–5.2)
ALT: 34 U/L (ref 0–53)
AST: 23 U/L (ref 0–37)
Alkaline Phosphatase: 59 U/L (ref 39–117)
BUN: 9 mg/dL (ref 6–23)
CHLORIDE: 105 meq/L (ref 96–112)
CO2: 28 meq/L (ref 19–32)
Calcium: 9.2 mg/dL (ref 8.4–10.5)
Creat: 1.02 mg/dL (ref 0.50–1.35)
Glucose, Bld: 113 mg/dL — ABNORMAL HIGH (ref 70–99)
Potassium: 4.4 mEq/L (ref 3.5–5.3)
SODIUM: 141 meq/L (ref 135–145)
TOTAL PROTEIN: 7.6 g/dL (ref 6.0–8.3)
Total Bilirubin: 0.6 mg/dL (ref 0.2–1.2)

## 2014-04-06 LAB — CBC WITH DIFFERENTIAL/PLATELET
Basophils Absolute: 0 10*3/uL (ref 0.0–0.1)
Basophils Relative: 0 % (ref 0–1)
EOS PCT: 2 % (ref 0–5)
Eosinophils Absolute: 0.1 10*3/uL (ref 0.0–0.7)
HCT: 43.5 % (ref 39.0–52.0)
Hemoglobin: 14.6 g/dL (ref 13.0–17.0)
LYMPHS ABS: 1.8 10*3/uL (ref 0.7–4.0)
LYMPHS PCT: 31 % (ref 12–46)
MCH: 28.3 pg (ref 26.0–34.0)
MCHC: 33.6 g/dL (ref 30.0–36.0)
MCV: 84.5 fL (ref 78.0–100.0)
Monocytes Absolute: 0.8 10*3/uL (ref 0.1–1.0)
Monocytes Relative: 14 % — ABNORMAL HIGH (ref 3–12)
NEUTROS PCT: 53 % (ref 43–77)
Neutro Abs: 3.1 10*3/uL (ref 1.7–7.7)
Platelets: 213 10*3/uL (ref 150–400)
RBC: 5.15 MIL/uL (ref 4.22–5.81)
RDW: 14 % (ref 11.5–15.5)
WBC: 5.9 10*3/uL (ref 4.0–10.5)

## 2014-04-06 LAB — VITAMIN D 25 HYDROXY (VIT D DEFICIENCY, FRACTURES): VIT D 25 HYDROXY: 30 ng/mL (ref 30–89)

## 2014-04-20 ENCOUNTER — Ambulatory Visit: Payer: BC Managed Care – PPO

## 2014-04-20 ENCOUNTER — Other Ambulatory Visit: Payer: BC Managed Care – PPO

## 2014-05-13 DIAGNOSIS — K4090 Unilateral inguinal hernia, without obstruction or gangrene, not specified as recurrent: Secondary | ICD-10-CM | POA: Insufficient documentation

## 2014-05-13 DIAGNOSIS — R350 Frequency of micturition: Secondary | ICD-10-CM | POA: Insufficient documentation

## 2014-05-13 DIAGNOSIS — F524 Premature ejaculation: Secondary | ICD-10-CM | POA: Insufficient documentation

## 2014-06-16 ENCOUNTER — Other Ambulatory Visit: Payer: Self-pay | Admitting: Neurology

## 2014-06-20 ENCOUNTER — Other Ambulatory Visit: Payer: Self-pay | Admitting: *Deleted

## 2014-06-20 ENCOUNTER — Telehealth: Payer: Self-pay | Admitting: Neurology

## 2014-06-20 NOTE — Telephone Encounter (Signed)
   Disp Refills Start End     REBIF 44 MCG/0.5ML SOSY injection 12 Syringe 5 06/16/2014     Sig: INJECT 44MCG (0.5 ML) SUBCUTANEOUSLY THREE TIMES PER WEEK. KEEP REFRIGERATED. AVOID FREEZING. DISCARD SYRINGE AFTER ONE USE.    E-Prescribing Status: Receipt confirmed by pharmacy (06/16/2014 2:33 PM EST)    called to  [phone] spoke with [place]

## 2014-06-20 NOTE — Telephone Encounter (Signed)
Pt called/returning your call from last Thursday.  C/B [phone]

## 2014-07-05 ENCOUNTER — Ambulatory Visit: Payer: BC Managed Care – PPO | Admitting: Neurology

## 2014-07-07 ENCOUNTER — Encounter: Payer: Self-pay | Admitting: *Deleted

## 2014-07-07 ENCOUNTER — Telehealth: Payer: Self-pay | Admitting: Neurology

## 2014-07-07 NOTE — Progress Notes (Unsigned)
No show letter sent for 07/05/2014

## 2014-07-07 NOTE — Telephone Encounter (Signed)
[name] showed 07/05/14 appt with Dr. Tomi Likens. Patient verbally confirmed appt when we did reminder calls.  Devon Barron - please send no show letter to patient / Gayleen Orem -

## 2014-07-13 ENCOUNTER — Encounter: Payer: Self-pay | Admitting: Family

## 2014-07-13 ENCOUNTER — Telehealth: Payer: Self-pay | Admitting: Family

## 2014-07-13 ENCOUNTER — Ambulatory Visit (INDEPENDENT_AMBULATORY_CARE_PROVIDER_SITE_OTHER): Payer: BC Managed Care – PPO | Admitting: Family

## 2014-07-13 ENCOUNTER — Other Ambulatory Visit (INDEPENDENT_AMBULATORY_CARE_PROVIDER_SITE_OTHER): Payer: BC Managed Care – PPO

## 2014-07-13 ENCOUNTER — Encounter (INDEPENDENT_AMBULATORY_CARE_PROVIDER_SITE_OTHER): Payer: Self-pay

## 2014-07-13 VITALS — BP 158/110 | HR 81 | Temp 98.1°F | Resp 18 | Ht 69.0 in | Wt 200.4 lb

## 2014-07-13 DIAGNOSIS — R5383 Other fatigue: Secondary | ICD-10-CM

## 2014-07-13 DIAGNOSIS — R221 Localized swelling, mass and lump, neck: Secondary | ICD-10-CM

## 2014-07-13 LAB — BASIC METABOLIC PANEL
BUN: 10 mg/dL (ref 6–23)
CALCIUM: 9.3 mg/dL (ref 8.4–10.5)
CO2: 29 mEq/L (ref 19–32)
CREATININE: 1 mg/dL (ref 0.4–1.5)
Chloride: 100 mEq/L (ref 96–112)
GFR: 106.51 mL/min (ref >60.00)
Glucose, Bld: 97 mg/dL (ref 70–99)
Potassium: 4.1 mEq/L (ref 3.5–5.1)
Sodium: 136 mEq/L (ref 135–145)

## 2014-07-13 LAB — CBC
HCT: 45 % (ref 39.0–52.0)
Hemoglobin: 14.4 g/dL (ref 13.0–17.0)
MCHC: 32 g/dL (ref 30.0–36.0)
MCV: 88.3 fl (ref 78.0–100.0)
Platelets: 207 10*3/uL (ref 150.0–400.0)
RBC: 5.1 Mil/uL (ref 4.22–5.81)
RDW: 14.3 % (ref 11.5–15.5)
WBC: 7 10*3/uL (ref 4.0–10.5)

## 2014-07-13 LAB — HEMOGLOBIN A1C: HEMOGLOBIN A1C: 6.3 % (ref 4.6–6.5)

## 2014-07-13 LAB — TSH: TSH: 1.73 u[IU]/mL (ref 0.35–4.50)

## 2014-07-13 NOTE — Telephone Encounter (Signed)
Please call informed patient that his lab work is within the normal expected ranges. This does indicate that the cause of his fatigue are not related to metabolic processes. His thyroid was fine and he is not diabetic. We will continue with the neck ultrasound to determine any nodules present.

## 2014-07-13 NOTE — Assessment & Plan Note (Signed)
Palpable nodule on left lower neck noted. Obtain ultrasounds of neck. Maybe the cause of his fatigue.

## 2014-07-13 NOTE — Progress Notes (Signed)
Pre visit review using our clinic review tool, if applicable. No additional management support is needed unless otherwise documented below in the visit note. 

## 2014-07-13 NOTE — Progress Notes (Signed)
   Subjective:    Patient ID: Devon Barron, male    DOB: 1960-08-28, 53 y.o.   MRN: [id number]  Chief Complaint  Patient presents with  . Follow-up    having a little neck pain, theres discoloration around the neck area and stays fatigue, says its been going on for a while, doesn't know if its his thyroid   HPI:  Devon Barron is a 53 y.o. male who presents today for an acute visit.  Works with [employer] and has been having some neck pain. Indicates that he has been feeling fatigued more and has had increased urination. Denies any increased hunger. Acute symptoms have been going on for about the last month to 6 weeks. Describes the pain as sore. Denies any trauma to his neck that he is aware of. Denies any difficulty in swallowing or breathing. There is nothing that makes it better or worse.   No Known Allergies  Current Outpatient Prescriptions on File Prior to Visit  Medication Sig Dispense Refill  . cholecalciferol (VITAMIN D) 1000 UNITS tablet Take 1,000 Units by mouth daily.    . interferon beta-1a (REBIF) 44 MCG/0.5ML injection Inject 44 mcg into the skin 3 (three) times a week.    Marland Kitchen LEVITRA 20 MG tablet     . REBIF 44 MCG/0.5ML SOSY injection INJECT 44MCG (0.5 ML) SUBCUTANEOUSLY THREE TIMES PER WEEK. KEEP REFRIGERATED. AVOID FREEZING. DISCARD SYRINGE AFTER ONE USE. 12 Syringe 5   No current facility-administered medications on file prior to visit.   Past Medical History  Diagnosis Date  . MS (multiple sclerosis)    Review of Systems    See HPI  Objective:    BP 158/110 mmHg  Pulse 81  Temp(Src) 98.1 F (36.7 C) (Oral)  Resp 18  Ht 5\' 9"  (1.753 m)  Wt 200 lb 6.4 oz (90.901 kg)  BMI 29.58 kg/m2  SpO2 97% Nursing note and vital signs reviewed.  Physical Exam  Constitutional: He is oriented to person, place, and time. He appears well-developed and well-nourished. No distress.  Neck: Neck supple. No JVD present. No tracheal deviation present.  No obvious deformity,  discoloration, or edema noted of neck. Palpable nodule noted left mid neck. Range of motion is complete.  Cardiovascular: Normal rate, regular rhythm, normal heart sounds and intact distal pulses.   Pulmonary/Chest: Effort normal and breath sounds normal.  Lymphadenopathy:    He has no cervical adenopathy.  Neurological: He is alert and oriented to person, place, and time.  Skin: Skin is warm and dry.  Psychiatric: He has a normal mood and affect. His behavior is normal. Judgment and thought content normal.       Assessment & Plan:

## 2014-07-13 NOTE — Assessment & Plan Note (Signed)
Idiopathic fatigue at this time.obtain complete blood count, hemoglobin A1c, TSH, and basic metabolic panel to rule out metabolic causes.

## 2014-07-13 NOTE — Patient Instructions (Signed)
Thank you for choosing [facility].  Summary/Instructions:  Please stop by the lab on the basement level of the building for your blood work. Your results will be released to [place] (or called to you) after review, usually within 72hours after test completion. If any changes need to be made, you will be notified at that same time.  Referrals have been made during this visit. You should expect to hear back from our schedulers in about 7-10 days in regards to establishing an appointment with the specialists we discussed.   If your symptoms worsen or fail to improve, please contact our office for further instruction, or in case of emergency go directly to the emergency room at the closest medical facility.   Fatigue Fatigue is a feeling of tiredness, lack of energy, lack of motivation, or feeling tired all the time. Having enough rest, good nutrition, and reducing stress will normally reduce fatigue. Consult your caregiver if it persists. The nature of your fatigue will help your caregiver to find out its cause. The treatment is based on the cause.  CAUSES  There are many causes for fatigue. Most of the time, fatigue can be traced to one or more of your habits or routines. Most causes fit into one or more of three general areas. They are: Lifestyle problems  Sleep disturbances.  Overwork.  Physical exertion.  Unhealthy habits.  Poor eating habits or eating disorders.  Alcohol and/or drug use .  Lack of proper nutrition (malnutrition). Psychological problems  Stress and/or anxiety problems.  Depression.  Grief.  Boredom. Medical Problems or Conditions  Anemia.  Pregnancy.  Thyroid gland problems.  Recovery from major surgery.  Continuous pain.  Emphysema or asthma that is not well controlled  Allergic conditions.  Diabetes.  Infections (such as mononucleosis).  Obesity.  Sleep disorders, such as sleep apnea.  Heart failure or other heart-related  problems.  Cancer.  Kidney disease.  Liver disease.  Effects of certain medicines such as antihistamines, cough and cold remedies, prescription pain medicines, heart and blood pressure medicines, drugs used for treatment of cancer, and some antidepressants. SYMPTOMS  The symptoms of fatigue include:   Lack of energy.  Lack of drive (motivation).  Drowsiness.  Feeling of indifference to the surroundings. DIAGNOSIS  The details of how you feel help guide your caregiver in finding out what is causing the fatigue. You will be asked about your present and past health condition. It is important to review all medicines that you take, including prescription and non-prescription items. A thorough exam will be done. You will be questioned about your feelings, habits, and normal lifestyle. Your caregiver may suggest blood tests, urine tests, or other tests to look for common medical causes of fatigue.  TREATMENT  Fatigue is treated by correcting the underlying cause. For example, if you have continuous pain or depression, treating these causes will improve how you feel. Similarly, adjusting the dose of certain medicines will help in reducing fatigue.  HOME CARE INSTRUCTIONS   Try to get the required amount of good sleep every night.  Eat a healthy and nutritious diet, and drink enough water throughout the day.  Practice ways of relaxing (including yoga or meditation).  Exercise regularly.  Make plans to change situations that cause stress. Act on those plans so that stresses decrease over time. Keep your work and personal routine reasonable.  Avoid street drugs and minimize use of alcohol.  Start taking a daily multivitamin after consulting your caregiver. [place]  IF:   You have persistent tiredness, which cannot be accounted for.  You have fever.  You have unintentional weight loss.  You have headaches.  You have disturbed sleep throughout the night.  You are feeling  sad.  You have constipation.  You have dry skin.  You have gained weight.  You are taking any new or different medicines that you suspect are causing fatigue.  You are unable to sleep at night.  You develop any unusual swelling of your legs or other parts of your body. SEEK IMMEDIATE MEDICAL CARE IF:   You are feeling confused.  Your vision is blurred.  You feel faint or pass out.  You develop severe headache.  You develop severe abdominal, pelvic, or back pain.  You develop chest pain, shortness of breath, or an irregular or fast heartbeat.  You are unable to pass a normal amount of urine.  You develop abnormal bleeding such as bleeding from the rectum or you vomit blood.  You have thoughts about harming yourself or committing suicide.  You are worried that you might harm someone else. MAKE SURE YOU:   Understand these instructions.  Will watch your condition.  Will get help right away if you are not doing well or get worse. Document Released: 05/19/2007 Document Revised: 10/14/2011 Document Reviewed: 11/23/2013 [facility] Patient Information 2015 [place], [place]. This information is not intended to replace advice given to you by your health care provider. Make sure you discuss any questions you have with your health care provider.

## 2014-07-14 NOTE — Telephone Encounter (Signed)
Let pt know that labs were normal and to keep the [place] appointment. I stated if he had any more concerns to please give [place] a call.

## 2014-07-18 ENCOUNTER — Other Ambulatory Visit: Payer: BC Managed Care – PPO

## 2014-08-02 ENCOUNTER — Encounter: Payer: Self-pay | Admitting: Neurology

## 2014-08-02 ENCOUNTER — Ambulatory Visit (INDEPENDENT_AMBULATORY_CARE_PROVIDER_SITE_OTHER): Payer: BC Managed Care – PPO | Admitting: Neurology

## 2014-08-02 VITALS — BP 130/80 | HR 74 | Temp 97.8°F | Resp 20 | Ht 69.0 in | Wt 198.8 lb

## 2014-08-02 DIAGNOSIS — G35 Multiple sclerosis: Secondary | ICD-10-CM

## 2014-08-02 NOTE — Patient Instructions (Signed)
1.  Continue Rebif and vitamin D 2.  Follow up in 3 months.

## 2014-08-02 NOTE — Progress Notes (Signed)
NEUROLOGY FOLLOW UP OFFICE NOTE  [name] [id number]  HISTORY OF PRESENT ILLNESS: Devon Barron is a 53 year old right-handed man with possible OSA, who follows up for multiple sclerosis and dizziness.  Labs reviewed.    UPDATE:     He is doing well.  04/06/14 LABS:  WBC 5.9, HGB 13.9, HCT 43.5, Na 141, K 4.4, Cl 105, glucose 113, BUN 9, Cr 1.02, TB 0.6, ALP 59, AST 23, ALT 34, TP 7.6, Alb 4.2, vitamin D 25-hydroxy 30.  HISTORY: He was diagnosed with MS in 2001.  At that time, he presented with left facial myokymia. He reports that he hasn't had many flare ups, and only a couple of flare-ups requiring pulse steroids.  They typically present as blurred vision, headache, dizziness, unsteadiness and fatigue.  He has both supratentorial and infratentorial demyelinating lesions.  His cervical cord is okay.  He has a history of lapses with physician follow-ups and non-adherence to disease-modifying agents, mostly due to financial reasons.  He previously was on Avonex, and then Rebif.  Due to financial reasons, he has not followed up with a neurologist in between 2013 and 2014 and had not taken his Rebif.  He developed increased fatigue, stiffness, blurred vision and intermittent headaches, similar to prior flare ups.  He presented to the ED on 05/03/13.  Physical exam revealed slight right sided facial droop.  Initially, a CT of the head was performed and revealed decreased attenuation at the right subfrontal-anterior temporal junction.  MRI Brain w/wo contrast was performed, and revealed extensive periventricular and subcortical T2 hyperintensities, as well as focal restricted diffusion within the splenium of the corpus callosum, concerning for active demyelination.  He has since restarted Rebif.  Chronic symptoms include mild fatigue, but if he gets 10 hours of sleep, he feels good.  He notes occasional urinary frequency but noting too concerning.  Transient symptoms include weakness, blurred vision  and headache.  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  03/08/14 MRI of the brain with and without contrast was performed on 03/08/14, which revealed no new demyelinating plaques compared to previous study from 07/13/13.  08/20/13:  IFN-b neutralizing antibody test negative.  PAST MEDICAL HISTORY: Past Medical History  Diagnosis Date  . MS (multiple sclerosis)     MEDICATIONS: Current Outpatient Prescriptions on File Prior to Visit  Medication Sig Dispense Refill  . cholecalciferol (VITAMIN D) 1000 UNITS tablet Take 1,000 Units by mouth daily.    . interferon beta-1a (REBIF) 44 MCG/0.5ML injection Inject 44 mcg into the skin 3 (three) times a week.    Marland Kitchen LEVITRA 20 MG tablet     . REBIF 44 MCG/0.5ML SOSY injection INJECT 44MCG (0.5 ML) SUBCUTANEOUSLY THREE TIMES PER WEEK. KEEP REFRIGERATED. AVOID FREEZING. DISCARD SYRINGE AFTER ONE USE. 12 Syringe 5   No current facility-administered medications on file prior to visit.    ALLERGIES: No Known Allergies  FAMILY HISTORY: Family History  Problem Relation Age of Onset  . Ataxia Neg Hx   . Chorea Neg Hx   . Dementia Neg Hx   . Mental retardation Neg Hx   . Migraines Neg Hx   . Multiple sclerosis Neg Hx   . Neurofibromatosis Neg Hx   . Neuropathy Neg Hx   . Parkinsonism Neg Hx   . Seizures Neg Hx   . Stroke Neg Hx   . Early death Neg Hx   . Cancer Neg Hx   . Alcohol abuse  Neg Hx   . Hearing loss Neg Hx   . Heart disease Neg Hx   . Hyperlipidemia Neg Hx   . Kidney disease Neg Hx   . Hypertension Mother   . Hypertension Father   . Diabetes Sister   . Diabetes Brother     SOCIAL HISTORY: History   Social History  . Marital Status: Single    Spouse Name: N/A    Number of Children: N/A  . Years of Education: N/A   Occupational History  . Not on file.   Social History Main Topics  . Smoking status: Never Smoker   . Smokeless tobacco: Never Used  . Alcohol Use: 3.0 oz/week    5 Cans of  beer per week     Comment: Occ  . Drug Use: No  . Sexual Activity:    Partners: Female   Other Topics Concern  . Not on file   Social History Narrative    REVIEW OF SYSTEMS: Constitutional: No fevers, chills, or sweats, no generalized fatigue, change in appetite Eyes: No visual changes, double vision, eye pain Ear, nose and throat: No hearing loss, ear pain, nasal congestion, sore throat Cardiovascular: No chest pain, palpitations Respiratory:  No shortness of breath at rest or with exertion, wheezes GastrointestinaI: No nausea, vomiting, diarrhea, abdominal pain, fecal incontinence Genitourinary:  No dysuria, urinary retention or frequency Musculoskeletal:  No neck pain, back pain Integumentary: No rash, pruritus, skin lesions Neurological: as above Psychiatric: No depression, insomnia, anxiety Endocrine: No palpitations, fatigue, diaphoresis, mood swings, change in appetite, change in weight, increased thirst Hematologic/Lymphatic:  No anemia, purpura, petechiae. Allergic/Immunologic: no itchy/runny eyes, nasal congestion, recent allergic reactions, rashes  PHYSICAL EXAM: Filed Vitals:   08/02/14 1551  BP: 130/80  Pulse: 74  Temp: 97.8 F (36.6 C)  Resp: 20   General: No acute distress Head:  Normocephalic/atraumatic Eyes:  Fundoscopic exam unremarkable without vessel changes, exudates, hemorrhages or papilledema. Neck: supple, no paraspinal tenderness, full range of motion Heart:  Regular rate and rhythm Lungs:  Clear to auscultation bilaterally Back: No paraspinal tenderness Neurological Exam: alert and oriented to person, place, and time. Attention span and concentration intact, recent and remote memory intact, fund of knowledge intact.  Speech with stuttering but not dysarthric, language intact.  Mild right facial weakness, otherwise CN II-XII intact. Fundi not visualized.  Bulk and tone normal, muscle strength 5/5 throughout.  Sensation to light touch, temperature  and vibration intact.  Deep tendon reflexes 2+ throughout, toes downgoing.  Finger to nose and heel to shin testing intact.  Gait station and stride normal, able to walk in tandem, Romberg negative.  IMPRESSION: Relapsing-remitting MS  PLAN: Rebif Vitamin D3 2000 IU daily Follow up in 3 months.  15 minutes spent with patient, over 50% spent discussing management.  Metta Clines, DO  CC: Scarlette Calico, MD

## 2014-11-02 ENCOUNTER — Ambulatory Visit (INDEPENDENT_AMBULATORY_CARE_PROVIDER_SITE_OTHER): Payer: BLUE CROSS/BLUE SHIELD | Admitting: Neurology

## 2014-11-02 ENCOUNTER — Encounter: Payer: Self-pay | Admitting: Neurology

## 2014-11-02 VITALS — BP 138/70 | HR 72 | Temp 98.5°F | Resp 16 | Ht 69.0 in | Wt 200.6 lb

## 2014-11-02 DIAGNOSIS — G35 Multiple sclerosis: Secondary | ICD-10-CM

## 2014-11-02 NOTE — Patient Instructions (Signed)
1.  Continue Rebif  2.  Continue vitamin D3 2000 units daily 3.  In August, repeat MRI of brain with and without contrast and CBC with diff and LFTs.  Follow up here soon after.

## 2014-11-02 NOTE — Progress Notes (Signed)
NEUROLOGY FOLLOW UP OFFICE NOTE  Devon Barron [id number]  HISTORY OF PRESENT ILLNESS: Devon Barron is a 54 year old right-handed man with possible OSA, who follows up for multiple sclerosis and dizziness.  Labs reviewed.    UPDATE:     He is taking Rebif.  He is doing well.  No new issues   HISTORY: He was diagnosed with MS in 2001.  At that time, he presented with left facial myokymia. He reports that he hasn't had many flare ups, and only a couple of flare-ups requiring pulse steroids.  They typically present as blurred vision, headache, dizziness, unsteadiness and fatigue.  He has both supratentorial and infratentorial demyelinating lesions.  His cervical cord is okay.  He has a history of lapses with physician follow-ups and non-adherence to disease-modifying agents, mostly due to financial reasons.  He previously was on Avonex, and then Rebif.  Due to financial reasons, he has not followed up with a neurologist in between 2013 and 2014 and had not taken his Rebif.  He developed increased fatigue, stiffness, blurred vision and intermittent headaches, similar to prior flare ups.  He presented to the ED on 05/03/13.  Physical exam revealed slight right sided facial droop.  Initially, a CT of the head was performed and revealed decreased attenuation at the right subfrontal-anterior temporal junction.  MRI Brain w/wo contrast was performed, and revealed extensive periventricular and subcortical T2 hyperintensities, as well as focal restricted diffusion within the splenium of the corpus callosum, concerning for active demyelination.  He has since restarted Rebif.  Chronic symptoms include mild fatigue, but if he gets 10 hours of sleep, he feels good.  He notes occasional urinary frequency but noting too concerning.  Transient symptoms include weakness, blurred vision and headache.  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  03/08/14 MRI of the brain with and without  contrast was performed on 03/08/14, which revealed no new demyelinating plaques compared to previous study from 07/13/13.  PAST MEDICAL HISTORY: Past Medical History  Diagnosis Date  . MS (multiple sclerosis)     MEDICATIONS: Current Outpatient Prescriptions on File Prior to Visit  Medication Sig Dispense Refill  . interferon beta-1a (REBIF) 44 MCG/0.5ML injection Inject 44 mcg into the skin 3 (three) times a week.    Marland Kitchen LEVITRA 20 MG tablet     . cholecalciferol (VITAMIN D) 1000 UNITS tablet Take 1,000 Units by mouth daily.    . INTERFERON BETA-1A Inject 44 mcg into the skin.    Marland Kitchen REBIF 44 MCG/0.5ML SOSY injection INJECT 44MCG (0.5 ML) SUBCUTANEOUSLY THREE TIMES PER WEEK. KEEP REFRIGERATED. AVOID FREEZING. DISCARD SYRINGE AFTER ONE USE. 12 Syringe 5   No current facility-administered medications on file prior to visit.    ALLERGIES: No Known Allergies  FAMILY HISTORY: Family History  Problem Relation Age of Onset  . Ataxia Neg Hx   . Chorea Neg Hx   . Dementia Neg Hx   . Mental retardation Neg Hx   . Migraines Neg Hx   . Multiple sclerosis Neg Hx   . Neurofibromatosis Neg Hx   . Neuropathy Neg Hx   . Parkinsonism Neg Hx   . Seizures Neg Hx   . Stroke Neg Hx   . Early death Neg Hx   . Cancer Neg Hx   . Alcohol abuse Neg Hx   . Hearing loss Neg Hx   . Heart disease Neg Hx   . Hyperlipidemia Neg Hx   .  Kidney disease Neg Hx   . Hypertension Mother   . Hypertension Father   . Diabetes Sister   . Diabetes Brother     SOCIAL HISTORY: History   Social History  . Marital Status: Single    Spouse Name: N/A  . Number of Children: N/A  . Years of Education: N/A   Occupational History  . Not on file.   Social History Main Topics  . Smoking status: Never Smoker   . Smokeless tobacco: Never Used  . Alcohol Use: 3.0 oz/week    5 Cans of beer per week     Comment: Occ  . Drug Use: No  . Sexual Activity:    Partners: Female   Other Topics Concern  . Not on file    Social History Narrative    REVIEW OF SYSTEMS: Constitutional: No fevers, chills, or sweats, no generalized fatigue, change in appetite Eyes: No visual changes, double vision, eye pain Ear, nose and throat: No hearing loss, ear pain, nasal congestion, sore throat Cardiovascular: No chest pain, palpitations Respiratory:  No shortness of breath at rest or with exertion, wheezes GastrointestinaI: No nausea, vomiting, diarrhea, abdominal pain, fecal incontinence Genitourinary:  No dysuria, urinary retention or frequency Musculoskeletal:  No neck pain, back pain Integumentary: No rash, pruritus, skin lesions Neurological: as above Psychiatric: No depression, insomnia, anxiety Endocrine: No palpitations, fatigue, diaphoresis, mood swings, change in appetite, change in weight, increased thirst Hematologic/Lymphatic:  No anemia, purpura, petechiae. Allergic/Immunologic: no itchy/runny eyes, nasal congestion, recent allergic reactions, rashes  PHYSICAL EXAM: Filed Vitals:   11/02/14 1430  BP: 138/70  Pulse: 72  Temp: 98.5 F (36.9 C)  Resp: 16   General: No acute distress Head:  Normocephalic/atraumatic Eyes:  Fundoscopic exam unremarkable without vessel changes, exudates, hemorrhages or papilledema. Neck: supple, no paraspinal tenderness, full range of motion Heart:  Regular rate and rhythm Lungs:  Clear to auscultation bilaterally Back: No paraspinal tenderness Neurological Exam: alert and oriented to person, place, and time. Attention span and concentration intact, recent and remote memory intact, fund of knowledge intact.  Speech with stuttering but not dysarthric, language intact.  Mild right facial weakness, otherwise CN II-XII intact. Fundi not visualized.  Bulk and tone normal, muscle strength 5/5 throughout.  Sensation to light touch, temperature and vibration intact.  Deep tendon reflexes 2+ throughout, toes downgoing.  Finger to nose and heel to shin testing intact.  Gait  station and stride normal, able to walk in tandem, Romberg negative.  IMPRESSION: Relapsing-remitting MS, stable  PLAN: Rebif Vitamin D3 2000 IU daily Check CBC, LFT and MRI of brain with and without contrast in 4 months, with follow up soon after.  15 minutes spent with patient, over 50% spent discussing management.  Metta Clines, DO  CC:  Scarlette Calico, MD

## 2014-12-15 ENCOUNTER — Telehealth: Payer: Self-pay | Admitting: *Deleted

## 2014-12-15 NOTE — Telephone Encounter (Signed)
Patient called in reference to his Rebif not being refilled please advise  Call back number [phone]

## 2014-12-15 NOTE — Telephone Encounter (Signed)
Patients mail box is full I am unable to leave a message for him . He should contact [place] regarding Rebif refill he already has the number to call

## 2014-12-16 ENCOUNTER — Telehealth: Payer: Self-pay | Admitting: *Deleted

## 2014-12-16 NOTE — Telephone Encounter (Signed)
This patients mailbox is full unable to leave message .

## 2014-12-16 NOTE — Telephone Encounter (Signed)
Patient called again today about his refills on Rebif

## 2014-12-18 ENCOUNTER — Other Ambulatory Visit: Payer: Self-pay | Admitting: Neurology

## 2015-02-14 ENCOUNTER — Ambulatory Visit (HOSPITAL_COMMUNITY)
Admission: RE | Admit: 2015-02-14 | Discharge: 2015-02-14 | Disposition: A | Discharge status: Home / Self Care | Payer: BLUE CROSS/BLUE SHIELD | Source: Ambulatory Visit | Attending: Neurology | Admitting: Neurology

## 2015-02-14 DIAGNOSIS — G35 Multiple sclerosis: Secondary | ICD-10-CM | POA: Diagnosis present

## 2015-02-14 MED ORDER — GADOBENATE DIMEGLUMINE 529 MG/ML IV SOLN
20.0000 mL | Freq: Once | INTRAVENOUS | Status: AC | PRN
  Administered 2015-02-14: 20 mL via INTRAVENOUS

## 2015-02-15 ENCOUNTER — Telehealth: Payer: Self-pay | Admitting: *Deleted

## 2015-02-15 NOTE — Telephone Encounter (Signed)
-----   Message from Pieter Partridge, DO sent at 02/15/2015  7:11 AM EDT ----- MRI of brain is stable.

## 2015-02-15 NOTE — Telephone Encounter (Signed)
I have tried to reach this patient several times his voicemail is not set up so unable to leave message

## 2015-03-20 ENCOUNTER — Ambulatory Visit (INDEPENDENT_AMBULATORY_CARE_PROVIDER_SITE_OTHER): Payer: BLUE CROSS/BLUE SHIELD | Admitting: Neurology

## 2015-03-20 ENCOUNTER — Encounter: Payer: Self-pay | Admitting: Neurology

## 2015-03-20 VITALS — BP 128/72 | HR 82 | Resp 18 | Ht 68.5 in | Wt 197.4 lb

## 2015-03-20 DIAGNOSIS — G35 Multiple sclerosis: Secondary | ICD-10-CM

## 2015-03-20 NOTE — Progress Notes (Signed)
NEUROLOGY FOLLOW UP OFFICE NOTE  Devon Barron [id number]  HISTORY OF PRESENT ILLNESS: Devon Barron is a 54 year old right-handed man with possible OSA, who follows up for multiple sclerosis.  MRI images of the brain from 02/14/15 were personally reviewed.  UPDATE:     He is taking Rebif.   MRI of brain with and without contrast from 02/14/15 unchanged compared to scans from 03/08/14 and 07/13/13.   He is doing well.  He feels a little tired every now and then.   HISTORY: He was diagnosed with MS in 2001.  At that time, he presented with left facial myokymia. He reports that he hasn't had many flare ups, and only a couple of flare-ups requiring pulse steroids.  They typically present as blurred vision, headache, dizziness, unsteadiness and fatigue.  He has both supratentorial and infratentorial demyelinating lesions.  His cervical cord is okay.  He has a history of lapses with physician follow-ups and non-adherence to disease-modifying agents, mostly due to financial reasons.  He previously was on Avonex, and then Rebif.  Due to financial reasons, he has not followed up with a neurologist in between 2013 and 2014 and had not taken his Rebif.  He developed increased fatigue, stiffness, blurred vision and intermittent headaches, similar to prior flare ups.  He presented to the ED on 05/03/13.  Physical exam revealed slight right sided facial droop.  Initially, a CT of the head was performed and revealed decreased attenuation at the right subfrontal-anterior temporal junction.  MRI Brain w/wo contrast was performed, and revealed extensive periventricular and subcortical T2 hyperintensities, as well as focal restricted diffusion within the splenium of the corpus callosum, concerning for active demyelination.  He has since restarted Rebif.  Chronic symptoms include mild fatigue, but if he gets 10 hours of sleep, he feels good.  He notes occasional urinary frequency but noting too concerning.  Transient  symptoms include weakness, blurred vision and headache.  He works for [employer], either lifting packages or standing and scanning.  03/08/14 MRI of the brain with and without contrast was performed on 03/08/14, which revealed no new demyelinating plaques compared to previous study from 07/13/13.  PAST MEDICAL HISTORY: Past Medical History  Diagnosis Date  . MS (multiple sclerosis)     MEDICATIONS: Current Outpatient Prescriptions on File Prior to Visit  Medication Sig Dispense Refill  . cholecalciferol (VITAMIN D) 1000 UNITS tablet Take 1,000 Units by mouth daily.    . interferon beta-1a (REBIF) 44 MCG/0.5ML injection Inject 44 mcg into the skin 3 (three) times a week.    . interferon beta-1a (REBIF) 44 MCG/0.5ML SOSY injection INJECT 44MCG (0.5 ML) SUBCUTANEOUSLY THREE TIMES PER WEEK. KEEP REFRIGERATED. AVOID FREEZING. DISCARD SYRINGE AFTER ONE USE.    . INTERFERON BETA-1A Inject 44 mcg into the skin.    Marland Kitchen LEVITRA 20 MG tablet     . REBIF 44 MCG/0.5ML SOSY injection INJECT 44MCG SUBCUTANEOUSLY THREE TIMES PER WEEK -REFRIGERATE DO NOT FREEZE 12 Syringe 4  . valACYclovir (VALTREX) 500 MG tablet Take 500 mg by mouth.     No current facility-administered medications on file prior to visit.    ALLERGIES: No Known Allergies  FAMILY HISTORY: Family History  Problem Relation Age of Onset  . Ataxia Neg Hx   . Chorea Neg Hx   . Dementia Neg Hx   . Mental retardation Neg Hx   . Migraines Neg Hx   . Multiple sclerosis Neg Hx   . Neurofibromatosis Neg Hx   .  Neuropathy Neg Hx   . Parkinsonism Neg Hx   . Seizures Neg Hx   . Stroke Neg Hx   . Early death Neg Hx   . Cancer Neg Hx   . Alcohol abuse Neg Hx   . Hearing loss Neg Hx   . Heart disease Neg Hx   . Hyperlipidemia Neg Hx   . Kidney disease Neg Hx   . Hypertension Mother   . Hypertension Father   . Diabetes Sister   . Diabetes Brother     SOCIAL HISTORY: Social History   Social History  . Marital Status: Single    Spouse  Name: N/A  . Number of Children: N/A  . Years of Education: N/A   Occupational History  . Not on file.   Social History Main Topics  . Smoking status: Never Smoker   . Smokeless tobacco: Never Used  . Alcohol Use: 3.0 oz/week    5 Cans of beer per week     Comment: Occ  . Drug Use: No  . Sexual Activity:    Partners: Female   Other Topics Concern  . Not on file   Social History Narrative    REVIEW OF SYSTEMS: Constitutional: No fevers, chills, or sweats, no generalized fatigue, change in appetite Eyes: No visual changes, double vision, eye pain Ear, nose and throat: No hearing loss, ear pain, nasal congestion, sore throat Cardiovascular: No chest pain, palpitations Respiratory:  No shortness of breath at rest or with exertion, wheezes GastrointestinaI: No nausea, vomiting, diarrhea, abdominal pain, fecal incontinence Genitourinary:  No dysuria, urinary retention or frequency Musculoskeletal:  No neck pain, back pain Integumentary: Non-pruritic rash on nose. Neurological: as above Psychiatric: No depression, insomnia, anxiety Endocrine: No palpitations, fatigue, diaphoresis, mood swings, change in appetite, change in weight, increased thirst Hematologic/Lymphatic:  No anemia, purpura, petechiae. Allergic/Immunologic: no itchy/runny eyes, nasal congestion, recent allergic reactions, rashes  PHYSICAL EXAM: Filed Vitals:   03/20/15 1432  BP: 128/72  Pulse: 82  Resp: 18   General: No acute distress.  Patient appears well-groomed.   Head:  Normocephalic/atraumatic Eyes:  Fundi not visualized on exam Neck: supple, no paraspinal tenderness, full range of motion Heart:  Regular rate and rhythm Lungs:  Clear to auscultation bilaterally Back: No paraspinal tenderness Neurological Exam: alert and oriented to person, place, and time. Attention span and concentration intact, recent and remote memory intact, fund of knowledge intact.  Speech with stuttering but not dysarthric,  language intact.  Mild right facial weakness, otherwise CN II-XII intact. Fundi not visualized.  Bulk and tone normal, muscle strength 5/5 throughout.  Sensation to light touch, temperature and vibration intact.  Deep tendon reflexes 2+ throughout, toes downgoing.  Finger to nose and heel to shin testing intact.  Gait station and stride normal, able to walk in tandem, Timed 25 foot walk 4.11 seconds.  Romberg negative.  IMPRESSION: Relapsing remitting MS, stable  PLAN: 1.  Continue Rebif 2.  Check CBC with diff and CMP now, as well as in 6 months prior to follow up.  27 minutes spent face to face with patient, over 50% spent discussing management.  Metta Clines, DO  CC:  Scarlette Calico, MD

## 2015-03-20 NOTE — Patient Instructions (Signed)
Continue Rebif Check CBC with diff and CMP now.  Recheck again in 6 months prior to follow up.

## 2015-03-21 ENCOUNTER — Other Ambulatory Visit: Payer: BLUE CROSS/BLUE SHIELD

## 2015-03-21 DIAGNOSIS — G35 Multiple sclerosis: Secondary | ICD-10-CM

## 2015-03-22 ENCOUNTER — Telehealth: Payer: Self-pay | Admitting: *Deleted

## 2015-03-22 LAB — COMPREHENSIVE METABOLIC PANEL
ALT: 21 U/L (ref 9–46)
AST: 17 U/L (ref 10–35)
Albumin: 4.1 g/dL (ref 3.6–5.1)
Alkaline Phosphatase: 50 U/L (ref 40–115)
BUN: 8 mg/dL (ref 7–25)
CALCIUM: 9.8 mg/dL (ref 8.6–10.3)
CO2: 30 mmol/L (ref 20–31)
Chloride: 101 mmol/L (ref 98–110)
Creat: 0.92 mg/dL (ref 0.70–1.33)
GLUCOSE: 84 mg/dL (ref 65–99)
POTASSIUM: 4.5 mmol/L (ref 3.5–5.3)
Sodium: 139 mmol/L (ref 135–146)
Total Bilirubin: 0.6 mg/dL (ref 0.2–1.2)
Total Protein: 7.5 g/dL (ref 6.1–8.1)

## 2015-03-22 LAB — CBC WITH DIFFERENTIAL/PLATELET
BASOS ABS: 0 10*3/uL (ref 0.0–0.1)
Basophils Relative: 0 % (ref 0–1)
EOS ABS: 0.1 10*3/uL (ref 0.0–0.7)
EOS PCT: 1 % (ref 0–5)
HEMATOCRIT: 44.7 % (ref 39.0–52.0)
Hemoglobin: 14.6 g/dL (ref 13.0–17.0)
LYMPHS PCT: 30 % (ref 12–46)
Lymphs Abs: 1.5 10*3/uL (ref 0.7–4.0)
MCH: 28 pg (ref 26.0–34.0)
MCHC: 32.7 g/dL (ref 30.0–36.0)
MCV: 85.6 fL (ref 78.0–100.0)
MONO ABS: 0.6 10*3/uL (ref 0.1–1.0)
MPV: 10.9 fL (ref 8.6–12.4)
Monocytes Relative: 11 % (ref 3–12)
Neutro Abs: 2.9 10*3/uL (ref 1.7–7.7)
Neutrophils Relative %: 58 % (ref 43–77)
PLATELETS: 202 10*3/uL (ref 150–400)
RBC: 5.22 MIL/uL (ref 4.22–5.81)
RDW: 14.4 % (ref 11.5–15.5)
WBC: 5 10*3/uL (ref 4.0–10.5)

## 2015-03-22 NOTE — Telephone Encounter (Signed)
-----   Message from Pieter Partridge, DO sent at 03/22/2015  7:12 AM EDT ----- Labs look okay

## 2015-03-22 NOTE — Telephone Encounter (Signed)
Patient is aware of normal labs  

## 2015-06-06 ENCOUNTER — Telehealth: Payer: Self-pay | Admitting: Neurology

## 2015-06-06 NOTE — Telephone Encounter (Signed)
Called Devon Barron back. We had no information as discoloration has not been reported to [place].

## 2015-06-06 NOTE — Telephone Encounter (Signed)
VM-Devon Barron called in regards to PT and the discoloration around PT's nose from the medication Rebif/Dawn CB# [id number]

## 2015-07-06 ENCOUNTER — Encounter (HOSPITAL_BASED_OUTPATIENT_CLINIC_OR_DEPARTMENT_OTHER): Payer: Self-pay | Admitting: *Deleted

## 2015-07-06 ENCOUNTER — Emergency Department (HOSPITAL_BASED_OUTPATIENT_CLINIC_OR_DEPARTMENT_OTHER): Payer: BLUE CROSS/BLUE SHIELD

## 2015-07-06 ENCOUNTER — Emergency Department (HOSPITAL_BASED_OUTPATIENT_CLINIC_OR_DEPARTMENT_OTHER)
Admission: EM | Admit: 2015-07-06 | Discharge: 2015-07-06 | Disposition: A | Discharge status: Home / Self Care | Payer: BLUE CROSS/BLUE SHIELD | Attending: Emergency Medicine | Admitting: Emergency Medicine

## 2015-07-06 DIAGNOSIS — G35 Multiple sclerosis: Secondary | ICD-10-CM | POA: Diagnosis not present

## 2015-07-06 DIAGNOSIS — Y9289 Other specified places as the place of occurrence of the external cause: Secondary | ICD-10-CM | POA: Insufficient documentation

## 2015-07-06 DIAGNOSIS — K08409 Partial loss of teeth, unspecified cause, unspecified class: Secondary | ICD-10-CM | POA: Diagnosis not present

## 2015-07-06 DIAGNOSIS — T189XXA Foreign body of alimentary tract, part unspecified, initial encounter: Secondary | ICD-10-CM | POA: Diagnosis not present

## 2015-07-06 DIAGNOSIS — Y9389 Activity, other specified: Secondary | ICD-10-CM | POA: Insufficient documentation

## 2015-07-06 DIAGNOSIS — Z79899 Other long term (current) drug therapy: Secondary | ICD-10-CM | POA: Diagnosis not present

## 2015-07-06 DIAGNOSIS — Y998 Other external cause status: Secondary | ICD-10-CM | POA: Diagnosis not present

## 2015-07-06 DIAGNOSIS — X58XXXA Exposure to other specified factors, initial encounter: Secondary | ICD-10-CM | POA: Diagnosis not present

## 2015-07-06 NOTE — ED Notes (Signed)
Swallowed his crown 10 days ago. He has not seen in pass in his stool.

## 2015-07-06 NOTE — Discharge Instructions (Signed)
Swallowed Foreign Body, Adult A swallowed foreign body means that you swallow something and it gets stuck. It might be food or something else. The object may get stuck in the tube that connects your throat to your stomach (esophagus), or it may get stuck in another part of your belly (digestive tract). It is very important to tell your doctor what you have swallowed. Sometimes, the object will pass through your body on its own. Sometimes, the object will pass after you are given a medicine to relax your throat. The object may need to be taken out by a doctor if it is dangerous or if it will not pass through your body on its own. An object may need to be removed with surgery if:  It gets stuck in your throat.  You cannot swallow.  You cannot breathe well.  It is sharp.  It is harmful or poisonous (toxic), like batteries or drugs. HOME CARE  Eat what you normally eat if your doctor says that you can.  Keep checking your poop (stool) to see if the object has come out of your body.  Call your doctor if the object has not come out of your body after 3 days. If you had surgery (endoscopy) to remove the object:  Care for yourself after surgery as told by your doctor. Keep all follow-up visits as told by your doctor. This is important. SEEK MEDICAL CARE IF:  You still have problems after you have been treated.  The object has not come out of your body after 3 days. GET HELP RIGHT AWAY IF:  You have a fever.  You have pain in your chest or your belly.  You cough up blood.  You have blood in your poop or your throw up (vomit).   This information is not intended to replace advice given to you by your health care provider. Make sure you discuss any questions you have with your health care provider.   Document Released: 11/06/2010 Document Revised: 04/12/2015 Document Reviewed: 10/19/2014 Elsevier Interactive Patient Education [employer].

## 2015-07-06 NOTE — ED Notes (Signed)
Patient transported to x-ray. ?

## 2015-07-06 NOTE — ED Provider Notes (Signed)
CSN: [id number]     Arrival date & time 07/06/15  1101 History   First MD Initiated Contact with Patient 07/06/15 1113     Chief Complaint  Patient presents with  . Swallowed Foreign Body     (Consider location/radiation/quality/duration/timing/severity/associated sxs/prior Treatment) HPI Comments: Patient is a 54 year old male who had a crown on his right front incisor. He states it continued to get loose and then when eating 10 days ago he noticed his tooth was missing and figured he swallowed it. He has been examining his stool and has not seen the tooth and is concerned it is still in his body.  Patient is a 54 y.o. male presenting with foreign body swallowed. The history is provided by the patient.  Swallowed Foreign Body This is a new problem. Episode onset: 10 days ago. The problem occurs constantly. The problem has not changed since onset.Pertinent negatives include no abdominal pain. Associated symptoms comments: No nausea or vomiting. Nothing aggravates the symptoms. Nothing relieves the symptoms.    Past Medical History  Diagnosis Date  . MS (multiple sclerosis) [facility])    Past Surgical History  Procedure Laterality Date  . Hernia repair     Family History  Problem Relation Age of Onset  . Ataxia Neg Hx   . Chorea Neg Hx   . Dementia Neg Hx   . Mental retardation Neg Hx   . Migraines Neg Hx   . Multiple sclerosis Neg Hx   . Neurofibromatosis Neg Hx   . Neuropathy Neg Hx   . Parkinsonism Neg Hx   . Seizures Neg Hx   . Stroke Neg Hx   . Early death Neg Hx   . Cancer Neg Hx   . Alcohol abuse Neg Hx   . Hearing loss Neg Hx   . Heart disease Neg Hx   . Hyperlipidemia Neg Hx   . Kidney disease Neg Hx   . Hypertension Mother   . Hypertension Father   . Diabetes Sister   . Diabetes Brother    Social History  Substance Use Topics  . Smoking status: Never Smoker   . Smokeless tobacco: Never Used  . Alcohol Use: 3.0 oz/week    5 Cans of beer per week     Comment:  Occ    Review of Systems  Gastrointestinal: Negative for abdominal pain.  All other systems reviewed and are negative.     Allergies  Review of patient's allergies indicates no known allergies.  Home Medications   Prior to Admission medications   Medication Sig Start Date End Date Taking? Authorizing Provider  cholecalciferol (VITAMIN D) 1000 UNITS tablet Take 1,000 Units by mouth daily.    Historical Provider, MD  clobetasol cream (TEMOVATE) 0.05 % apply to affected area twice a day 02/21/15   Historical Provider, MD  interferon beta-1a (REBIF) 44 MCG/0.5ML injection Inject 44 mcg into the skin 3 (three) times a week.    Historical Provider, MD  interferon beta-1a (REBIF) 44 MCG/0.5ML SOSY injection INJECT 44MCG (0.5 ML) SUBCUTANEOUSLY THREE TIMES PER WEEK. KEEP REFRIGERATED. AVOID FREEZING. DISCARD SYRINGE AFTER ONE USE. 06/16/14   Historical Provider, MD  INTERFERON BETA-1A Inject 44 mcg into the skin.    Historical Provider, MD  LEVITRA 20 MG tablet  07/09/13   Historical Provider, MD  REBIF 44 MCG/0.5ML SOSY injection INJECT 44MCG SUBCUTANEOUSLY THREE TIMES PER WEEK -REFRIGERATE DO NOT FREEZE 12/19/14   Pieter Partridge, DO  tacrolimus (PROTOPIC) 0.1 % ointment  02/10/15  Historical Provider, MD  tadalafil (CIALIS) 5 MG tablet Take 1 tablet one hour prior to sexual activity. 02/20/15   Historical Provider, MD  valACYclovir (VALTREX) 500 MG tablet Take 500 mg by mouth. 10/05/14 10/05/15  Historical Provider, MD  valACYclovir (VALTREX) 500 MG tablet Take 500 mg by mouth. 11/03/14 11/03/15  Historical Provider, MD   BP 139/92 mmHg  Pulse 85  Temp(Src) 98 F (36.7 C) (Oral)  Resp 20  Ht 5\' 9"  (1.753 m)  Wt 197 lb (89.359 kg)  BMI 29.08 kg/m2  SpO2 99% Physical Exam  Constitutional: He is oriented to person, place, and time. He appears well-developed and well-nourished. No distress.  HENT:  Head: Normocephalic and atraumatic.  Mouth/Throat:    Cardiovascular: Normal rate.     Pulmonary/Chest: Effort normal.  Neurological: He is alert and oriented to person, place, and time.  Skin: Skin is warm and dry.  Psychiatric: He has a normal mood and affect. His behavior is normal.  Nursing note and vitals reviewed.   ED Course  Procedures (including critical care time) Labs Review Labs Reviewed - No data to display  Imaging Review Dg Abd 1 View  07/06/2015  CLINICAL DATA:  Patient swallowed tooth one week prior EXAM: ABDOMEN - 1 VIEW COMPARISON:  None. FINDINGS: There is no demonstrable radiopaque foreign body. There is fairly diffuse stool throughout the colon. No bowel dilatation or air-fluid level suggesting obstruction. No free air. There are phleboliths in the pelvis. Lung bases are clear. IMPRESSION: No demonstrable radiopaque foreign body. Stool throughout colon. Bowel gas pattern unremarkable. Lung bases clear. Electronically Signed   By: Lowella Grip III M.D.   On: 07/06/2015 12:20   I have personally reviewed and evaluated these images and lab results as part of my medical decision-making.   EKG Interpretation None      MDM   Final diagnoses:  Swallowed foreign body, initial encounter    Patient is presenting because he thinks he swallowed his crown 10 days ago. He has not seen it pass in his stool and just once to make sure it is not in his body. Patient has no abdominal pain nausea or vomiting. He is eating normally.  12:31 PM Imaging is negative with no sign of foreign body.   Blanchie Dessert, MD 07/06/15 1231

## 2015-09-20 ENCOUNTER — Ambulatory Visit: Payer: BLUE CROSS/BLUE SHIELD | Admitting: Neurology

## 2015-09-26 ENCOUNTER — Other Ambulatory Visit (INDEPENDENT_AMBULATORY_CARE_PROVIDER_SITE_OTHER): Payer: BLUE CROSS/BLUE SHIELD

## 2015-09-26 ENCOUNTER — Encounter: Payer: Self-pay | Admitting: Neurology

## 2015-09-26 ENCOUNTER — Ambulatory Visit (INDEPENDENT_AMBULATORY_CARE_PROVIDER_SITE_OTHER): Payer: BLUE CROSS/BLUE SHIELD | Admitting: Neurology

## 2015-09-26 VITALS — BP 130/84 | HR 95 | Ht 67.0 in | Wt 198.0 lb

## 2015-09-26 DIAGNOSIS — G35 Multiple sclerosis: Secondary | ICD-10-CM

## 2015-09-26 LAB — CBC WITH DIFFERENTIAL/PLATELET
BASOS ABS: 0 10*3/uL (ref 0.0–0.1)
Basophils Relative: 0.7 % (ref 0.0–3.0)
Eosinophils Absolute: 0 10*3/uL (ref 0.0–0.7)
Eosinophils Relative: 0.8 % (ref 0.0–5.0)
HCT: 42.8 % (ref 39.0–52.0)
Hemoglobin: 14.1 g/dL (ref 13.0–17.0)
LYMPHS ABS: 1.6 10*3/uL (ref 0.7–4.0)
Lymphocytes Relative: 28.4 % (ref 12.0–46.0)
MCHC: 33 g/dL (ref 30.0–36.0)
MCV: 86.7 fl (ref 78.0–100.0)
MONO ABS: 0.7 10*3/uL (ref 0.1–1.0)
Monocytes Relative: 13.1 % — ABNORMAL HIGH (ref 3.0–12.0)
NEUTROS PCT: 57 % (ref 43.0–77.0)
Neutro Abs: 3.3 10*3/uL (ref 1.4–7.7)
Platelets: 196 10*3/uL (ref 150.0–400.0)
RBC: 4.94 Mil/uL (ref 4.22–5.81)
RDW: 13.4 % (ref 11.5–15.5)
WBC: 5.7 10*3/uL (ref 4.0–10.5)

## 2015-09-26 LAB — HEPATIC FUNCTION PANEL
ALK PHOS: 60 U/L (ref 39–117)
ALT: 28 U/L (ref 0–53)
AST: 21 U/L (ref 0–37)
Albumin: 4.3 g/dL (ref 3.5–5.2)
BILIRUBIN DIRECT: 0.1 mg/dL (ref 0.0–0.3)
BILIRUBIN TOTAL: 0.4 mg/dL (ref 0.2–1.2)
Total Protein: 7.5 g/dL (ref 6.0–8.3)

## 2015-09-26 LAB — VITAMIN D 25 HYDROXY (VIT D DEFICIENCY, FRACTURES): VITD: 21.19 ng/mL — ABNORMAL LOW (ref 30.00–100.00)

## 2015-09-26 NOTE — Progress Notes (Signed)
NEUROLOGY FOLLOW UP OFFICE NOTE  Devon Barron [id number]  HISTORY OF PRESENT ILLNESS: Devon Barron is a 55 year old right-handed man with possible OSA, who follows up for multiple sclerosis.  MRI images of the brain from 02/14/15 were personally reviewed.  UPDATE:     He is taking Rebif.    He is doing very well.  He denies fatigue and pain.  He had been taking D3 2000 IU up until 3 weeks ago, because he ran out.  HISTORY: He was diagnosed with MS in 2001.  At that time, he presented with left facial myokymia. He reports that he hasn't had many flare ups, and only a couple of flare-ups requiring pulse steroids.  They typically present as blurred vision, headache, dizziness, unsteadiness and fatigue.  He has both supratentorial and infratentorial demyelinating lesions.  His cervical cord is okay.  He has a history of lapses with physician follow-ups and non-adherence to disease-modifying agents, mostly due to financial reasons.  He previously was on Avonex, and then Rebif.  Due to financial reasons, he has not followed up with a neurologist in between 2013 and 2014 and had not taken his Rebif.  He developed increased fatigue, stiffness, blurred vision and intermittent headaches, similar to prior flare ups.  He presented to the ED on 05/03/13.  Physical exam revealed slight right sided facial droop.  Initially, a CT of the head was performed and revealed decreased attenuation at the right subfrontal-anterior temporal junction.  MRI Brain w/wo contrast was performed, and revealed extensive periventricular and subcortical T2 hyperintensities, as well as focal restricted diffusion within the splenium of the corpus callosum, concerning for active demyelination.  He has since restarted Rebif.  Chronic symptoms include mild fatigue, but if he gets 10 hours of sleep, he feels good.  He notes occasional urinary frequency but noting too concerning.  Transient symptoms include weakness, blurred vision and  headache.  He works for [employer], either lifting packages or standing and scanning.  MRI of brain with and without contrast from 02/14/15 unchanged compared to scans from 03/08/14 and 07/13/13.     PAST MEDICAL HISTORY: Past Medical History  Diagnosis Date  . MS (multiple sclerosis) ([place])     MEDICATIONS: Current Outpatient Prescriptions on File Prior to Visit  Medication Sig Dispense Refill  . cholecalciferol (VITAMIN D) 1000 UNITS tablet Take 1,000 Units by mouth daily.    . clobetasol cream (TEMOVATE) 0.05 % apply to affected area twice a day  0  . interferon beta-1a (REBIF) 44 MCG/0.5ML SOSY injection INJECT 44MCG (0.5 ML) SUBCUTANEOUSLY THREE TIMES PER WEEK. KEEP REFRIGERATED. AVOID FREEZING. DISCARD SYRINGE AFTER ONE USE.    . INTERFERON BETA-1A Inject 44 mcg into the skin.    Marland Kitchen LEVITRA 20 MG tablet     . tadalafil (CIALIS) 5 MG tablet Take 1 tablet one hour prior to sexual activity.    . valACYclovir (VALTREX) 500 MG tablet Take 500 mg by mouth.     No current facility-administered medications on file prior to visit.    ALLERGIES: No Known Allergies  FAMILY HISTORY: Family History  Problem Relation Age of Onset  . Ataxia Neg Hx   . Chorea Neg Hx   . Dementia Neg Hx   . Mental retardation Neg Hx   . Migraines Neg Hx   . Multiple sclerosis Neg Hx   . Neurofibromatosis Neg Hx   . Neuropathy Neg Hx   . Parkinsonism Neg Hx   . Seizures Neg  Hx   . Stroke Neg Hx   . Early death Neg Hx   . Cancer Neg Hx   . Alcohol abuse Neg Hx   . Hearing loss Neg Hx   . Heart disease Neg Hx   . Hyperlipidemia Neg Hx   . Kidney disease Neg Hx   . Hypertension Mother   . Hypertension Father   . Diabetes Sister   . Diabetes Brother     SOCIAL HISTORY: Social History   Social History  . Marital Status: Single    Spouse Name: N/A  . Number of Children: N/A  . Years of Education: N/A   Occupational History  . Not on file.   Social History Main Topics  . Smoking status: Never  Smoker   . Smokeless tobacco: Never Used  . Alcohol Use: 3.0 oz/week    5 Cans of beer per week     Comment: Occ  . Drug Use: No  . Sexual Activity:    Partners: Female   Other Topics Concern  . Not on file   Social History Narrative    REVIEW OF SYSTEMS: Constitutional: No fevers, chills, or sweats, no generalized fatigue, change in appetite Eyes: No visual changes, double vision, eye pain Ear, nose and throat: No hearing loss, ear pain, nasal congestion, sore throat Cardiovascular: No chest pain, palpitations Respiratory:  No shortness of breath at rest or with exertion, wheezes GastrointestinaI: No nausea, vomiting, diarrhea, abdominal pain, fecal incontinence Genitourinary:  No dysuria, urinary retention or frequency Musculoskeletal:  No neck pain, back pain Integumentary: No rash, pruritus, skin lesions Neurological: as above Psychiatric: No depression, insomnia, anxiety Endocrine: No palpitations, fatigue, diaphoresis, mood swings, change in appetite, change in weight, increased thirst Hematologic/Lymphatic:  No anemia, purpura, petechiae. Allergic/Immunologic: no itchy/runny eyes, nasal congestion, recent allergic reactions, rashes  PHYSICAL EXAM: Filed Vitals:   09/26/15 1538  BP: 130/84  Pulse: 95   General: No acute distress.  Patient appears well-groomed.   Head:  Normocephalic/atraumatic Eyes:  Fundoscopic exam unremarkable without vessel changes, exudates, hemorrhages or papilledema. Neck: supple, no paraspinal tenderness, full range of motion Heart:  Regular rate and rhythm Lungs:  Clear to auscultation bilaterally Back: No paraspinal tenderness Neurological Exam: alert and oriented to person, place, and time. Attention span and concentration intact, recent and remote memory intact, fund of knowledge intact.  Speech with stuttering but not dysarthric, language intact.  Mild right facial weakness, otherwise CN II-XII intact. Fundi not visualized.  Bulk and  tone normal, muscle strength 5/5 throughout.  Sensation to light touch, temperature and vibration intact.  Deep tendon reflexes 2+ throughout, toes downgoing.  Finger to nose and heel to shin testing intact.  Gait station and stride normal, able to walk in tandem, Timed 25 foot walk 4.34 seconds.  Romberg negative.  IMPRESSION: Relapsing-remitting MS, stable  PLAN: 1.  Rebif 2.  Today, check CBC with diff, LFTs and vitamin D level 3.  Recheck CBC with diff and LFTs in 6 months.  Follow up soon afterwards  20 minutes spent face to face with patient, over 50% spent discussing management.  Metta Clines, DO  CC:  Scarlette Calico, MD

## 2015-09-26 NOTE — Patient Instructions (Signed)
1.  Continue rebif 2.  Today, check CBC with diff, LFTs and vitamin D level 3.  Recheck CBC with diff and LFTs in 6 months.  Follow up soon afterwards

## 2015-09-27 ENCOUNTER — Telehealth: Payer: Self-pay

## 2015-09-27 NOTE — Telephone Encounter (Signed)
-----   Message from Pieter Partridge, DO sent at 09/26/2015  6:51 PM EST ----- Vitamin d level is low.  Recommend going back to 4000 units of D3 daily

## 2015-09-27 NOTE — Telephone Encounter (Signed)
Left message on machine for pt to return call to the office.  

## 2015-11-22 ENCOUNTER — Other Ambulatory Visit: Payer: Self-pay | Admitting: Neurology

## 2015-11-22 NOTE — Telephone Encounter (Signed)
Last OV: 09/26/15 Next OV: 03/25/16

## 2015-12-08 ENCOUNTER — Encounter: Payer: Self-pay | Admitting: Neurology

## 2015-12-11 ENCOUNTER — Encounter: Payer: Self-pay | Admitting: Neurology

## 2015-12-13 ENCOUNTER — Telehealth: Payer: Self-pay | Admitting: Neurology

## 2015-12-13 NOTE — Telephone Encounter (Signed)
Letter written. In provider's inbox for signature. Will call pt when done.

## 2015-12-13 NOTE — Telephone Encounter (Signed)
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 Pt states while it is more energy efficient, it AC is not very good, it is very loud in the cab, and the vibrations are much worse. Pt states that heat, noise, and continuous vibrations have always been triggers for him. 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

## 2015-12-13 NOTE — Telephone Encounter (Signed)
We can provide him with a letter stating that due to an undisclosed medical condition, he recommend that he be issued a vehicle with working air conditioning and one that does not vibrate (to be honest, I do not know how to phrase the vibration component).

## 2015-12-13 NOTE — Telephone Encounter (Signed)
Left message for pt to call back, to gather more information. How are the trucks different? What makes the new one undriveable for pt?  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

## 2015-12-13 NOTE — Telephone Encounter (Signed)
Devon Barron, Devon Barron 10/18/1960 He was wanting you to call him at [phone]. He said that he has MS and that he works for [employer]. He said he does not want anyone at work to know that he has it. 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 He would like to see if Dr. Tomi Likens could write a letter explaining that he has a medical condition without saying he has MS and that he needs to drive the other truck. Thank you

## 2015-12-13 NOTE — Telephone Encounter (Signed)
Please advise on letter

## 2015-12-14 ENCOUNTER — Encounter: Payer: Self-pay | Admitting: Internal Medicine

## 2015-12-14 NOTE — Telephone Encounter (Signed)
Spoke w/ patient. He will come pick letter up. Letter placed at front desk.

## 2015-12-14 NOTE — Telephone Encounter (Signed)
Contacted pt apt made 5/25

## 2015-12-20 ENCOUNTER — Encounter: Payer: Self-pay | Admitting: Neurology

## 2015-12-26 ENCOUNTER — Encounter: Payer: Self-pay | Admitting: Neurology

## 2015-12-26 NOTE — Telephone Encounter (Signed)
Received pt's job description in my inbox this afternoon. No form or anything attached to job description. Unsure what patient needs [place] to do. Called and vm was full. Message sent via mychart.

## 2015-12-27 ENCOUNTER — Encounter: Payer: Self-pay | Admitting: Neurology

## 2015-12-27 NOTE — Telephone Encounter (Signed)
Please see mychart message. 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 Job description placed in inbox. Please advise.

## 2015-12-28 ENCOUNTER — Encounter: Payer: Self-pay | Admitting: Internal Medicine

## 2015-12-28 ENCOUNTER — Other Ambulatory Visit (INDEPENDENT_AMBULATORY_CARE_PROVIDER_SITE_OTHER): Payer: BLUE CROSS/BLUE SHIELD

## 2015-12-28 ENCOUNTER — Ambulatory Visit (INDEPENDENT_AMBULATORY_CARE_PROVIDER_SITE_OTHER): Payer: BLUE CROSS/BLUE SHIELD | Admitting: Internal Medicine

## 2015-12-28 VITALS — BP 122/84 | HR 76 | Temp 98.4°F | Resp 16 | Ht 67.0 in | Wt 198.0 lb

## 2015-12-28 DIAGNOSIS — Z23 Encounter for immunization: Secondary | ICD-10-CM | POA: Diagnosis not present

## 2015-12-28 DIAGNOSIS — R7989 Other specified abnormal findings of blood chemistry: Secondary | ICD-10-CM

## 2015-12-28 DIAGNOSIS — R7309 Other abnormal glucose: Secondary | ICD-10-CM | POA: Diagnosis not present

## 2015-12-28 DIAGNOSIS — Z0000 Encounter for general adult medical examination without abnormal findings: Secondary | ICD-10-CM

## 2015-12-28 DIAGNOSIS — Z1159 Encounter for screening for other viral diseases: Secondary | ICD-10-CM

## 2015-12-28 LAB — COMPREHENSIVE METABOLIC PANEL
ALBUMIN: 4.5 g/dL (ref 3.5–5.2)
ALK PHOS: 49 U/L (ref 39–117)
ALT: 26 U/L (ref 0–53)
AST: 20 U/L (ref 0–37)
BILIRUBIN TOTAL: 0.5 mg/dL (ref 0.2–1.2)
BUN: 11 mg/dL (ref 6–23)
CALCIUM: 9.7 mg/dL (ref 8.4–10.5)
CO2: 31 mEq/L (ref 19–32)
Chloride: 103 mEq/L (ref 96–112)
Creatinine, Ser: 1.03 mg/dL (ref 0.40–1.50)
GFR: 96.49 mL/min (ref >60.00)
GLUCOSE: 100 mg/dL — AB (ref 70–99)
POTASSIUM: 4.2 meq/L (ref 3.5–5.1)
Sodium: 141 mEq/L (ref 135–145)
TOTAL PROTEIN: 7.5 g/dL (ref 6.0–8.3)

## 2015-12-28 LAB — LIPID PANEL
CHOLESTEROL: 217 mg/dL — AB (ref 0–200)
HDL: 43.9 mg/dL (ref >39.00)
NonHDL: 173.44
TRIGLYCERIDES: 229 mg/dL — AB (ref 0.0–149.0)
Total CHOL/HDL Ratio: 5
VLDL: 45.8 mg/dL — ABNORMAL HIGH (ref 0.0–40.0)

## 2015-12-28 LAB — TSH: TSH: 1.17 u[IU]/mL (ref 0.35–4.50)

## 2015-12-28 LAB — URINALYSIS, ROUTINE W REFLEX MICROSCOPIC
Hgb urine dipstick: NEGATIVE
Leukocytes, UA: NEGATIVE
NITRITE: NEGATIVE
PH: 6.5 (ref 5.0–8.0)
RBC / HPF: NONE SEEN (ref 0–?)
Specific Gravity, Urine: 1.025 (ref 1.000–1.030)
Urine Glucose: NEGATIVE
Urobilinogen, UA: 4 — AB (ref 0.0–1.0)

## 2015-12-28 LAB — FECAL OCCULT BLOOD, GUAIAC: Fecal Occult Blood: NEGATIVE

## 2015-12-28 LAB — PSA: PSA: 2.79 ng/mL (ref 0.10–4.00)

## 2015-12-28 LAB — HEMOGLOBIN A1C: HEMOGLOBIN A1C: 6.3 % (ref 4.6–6.5)

## 2015-12-28 LAB — LDL CHOLESTEROL, DIRECT: LDL DIRECT: 130 mg/dL

## 2015-12-28 NOTE — Patient Instructions (Signed)

## 2015-12-28 NOTE — Progress Notes (Signed)
Pre visit review using our clinic review tool, if applicable. No additional management support is needed unless otherwise documented below in the visit note. 

## 2015-12-28 NOTE — Progress Notes (Signed)
Subjective:  Patient ID: Devon Barron, male    DOB: 1960/09/24  Age: 55 y.o. MRN: [id number]  CC: Annual Exam and Hyperlipidemia   HPI Devon Barron presents for a CPX.  He offers no new complaints today. He has had no recent s/s concerning for an MS relapse.   Past Medical History  Diagnosis Date  . MS (multiple sclerosis) [facility])    Past Surgical History  Procedure Laterality Date  . Hernia repair      reports that he has never smoked. He has never used smokeless tobacco. He reports that he drinks about 3.0 oz of alcohol per week. He reports that he does not use illicit drugs. family history includes Diabetes in his brother and sister; Hypertension in his father and mother. There is no history of Ataxia, Chorea, Dementia, Mental retardation, Migraines, Multiple sclerosis, Neurofibromatosis, Neuropathy, Parkinsonism, Seizures, Stroke, Early death, Cancer, Alcohol abuse, Hearing loss, Heart disease, Hyperlipidemia, or Kidney disease. No Known Allergies  Outpatient Prescriptions Prior to Visit  Medication Sig Dispense Refill  . clobetasol cream (TEMOVATE) 0.05 % apply to affected area twice a day  0  . INTERFERON BETA-1A Inject 44 mcg into the skin.    Marland Kitchen REBIF 44 MCG/0.5ML SOSY injection INJECT 44MCG SUBCUTANEOUSLY THREE TIMES PER WEEK 12 Syringe 5  . tadalafil (CIALIS) 5 MG tablet Take 1 tablet one hour prior to sexual activity.    . cholecalciferol (VITAMIN D) 1000 UNITS tablet Take 1,000 Units by mouth daily.    Marland Kitchen LEVITRA 20 MG tablet      No facility-administered medications prior to visit.    ROS Review of Systems  Constitutional: Negative.  Negative for fatigue.  HENT: Negative.   Eyes: Negative.  Negative for visual disturbance.  Respiratory: Negative.  Negative for cough, choking, chest tightness, shortness of breath and stridor.   Cardiovascular: Negative.  Negative for chest pain, palpitations and leg swelling.  Gastrointestinal: Negative.  Negative for nausea,  vomiting, abdominal pain, diarrhea and constipation.  Endocrine: Negative.  Negative for polydipsia, polyphagia and polyuria.  Genitourinary: Negative.  Negative for dysuria, urgency, frequency, hematuria, flank pain, scrotal swelling, enuresis and difficulty urinating.  Musculoskeletal: Negative.  Negative for myalgias, back pain, joint swelling and arthralgias.  Skin: Negative.   Allergic/Immunologic: Negative.   Neurological: Negative.  Negative for dizziness, seizures, weakness and numbness.  Hematological: Negative.  Negative for adenopathy. Does not bruise/bleed easily.  Psychiatric/Behavioral: Negative.     Objective:  BP 122/84 mmHg  Pulse 76  Temp(Src) 98.4 F (36.9 C) (Oral)  Resp 16  Ht 5\' 7"  (1.702 m)  Wt 198 lb (89.812 kg)  BMI 31.00 kg/m2  SpO2 97%  BP Readings from Last 3 Encounters:  12/28/15 122/84  09/26/15 130/84  07/06/15 139/92    Wt Readings from Last 3 Encounters:  12/28/15 198 lb (89.812 kg)  09/26/15 198 lb (89.812 kg)  07/06/15 197 lb (89.359 kg)    Physical Exam  Constitutional: He is oriented to person, place, and time. He appears well-developed and well-nourished. No distress.  HENT:  Mouth/Throat: Oropharynx is clear and moist. No oropharyngeal exudate.  Eyes: Conjunctivae are normal. Right eye exhibits no discharge. Left eye exhibits no discharge. No scleral icterus.  Neck: Normal range of motion. Neck supple. No JVD present. No tracheal deviation present. No thyromegaly present.  Cardiovascular: Normal rate, regular rhythm, normal heart sounds and intact distal pulses.  Exam reveals no gallop and no friction rub.   No murmur heard. Pulmonary/Chest:  Effort normal and breath sounds normal. No stridor. No respiratory distress. He has no wheezes. He has no rales. He exhibits no tenderness.  Abdominal: Soft. Bowel sounds are normal. He exhibits no distension and no mass. There is no tenderness. There is no rebound and no guarding. Hernia confirmed  negative in the right inguinal area and confirmed negative in the left inguinal area.  Genitourinary: Testes normal. Rectal exam shows no external hemorrhoid, no internal hemorrhoid and no tenderness. Prostate is enlarged (1+ smooth symm BPH). Prostate is not tender. Right testis shows no mass, no swelling and no tenderness. Right testis is descended. Left testis shows no mass, no swelling and no tenderness. Left testis is descended. Circumcised. No penile erythema or penile tenderness. No discharge found.  Musculoskeletal: Normal range of motion. He exhibits no edema.  Lymphadenopathy:    He has no cervical adenopathy.       Right: No inguinal adenopathy present.       Left: No inguinal adenopathy present.  Neurological: He is oriented to person, place, and time.  Skin: Skin is warm and dry. No rash noted. He is not diaphoretic. No erythema. No pallor.  Vitals reviewed.   Lab Results  Component Value Date   WBC 5.7 09/26/2015   HGB 14.1 09/26/2015   HCT 42.8 09/26/2015   PLT 196.0 09/26/2015   GLUCOSE 100* 12/28/2015   CHOL 217* 12/28/2015   TRIG 229.0* 12/28/2015   HDL 43.90 12/28/2015   LDLDIRECT 130.0 12/28/2015   LDLCALC 106* 09/03/2013   ALT 26 12/28/2015   AST 20 12/28/2015   NA 141 12/28/2015   K 4.2 12/28/2015   CL 103 12/28/2015   CREATININE 1.03 12/28/2015   BUN 11 12/28/2015   CO2 31 12/28/2015   TSH 1.17 12/28/2015   PSA 2.79 12/28/2015   HGBA1C 6.3 12/28/2015    Dg Abd 1 View  07/06/2015  CLINICAL DATA:  Patient swallowed tooth one week prior EXAM: ABDOMEN - 1 VIEW COMPARISON:  None. FINDINGS: There is no demonstrable radiopaque foreign body. There is fairly diffuse stool throughout the colon. No bowel dilatation or air-fluid level suggesting obstruction. No free air. There are phleboliths in the pelvis. Lung bases are clear. IMPRESSION: No demonstrable radiopaque foreign body. Stool throughout colon. Bowel gas pattern unremarkable. Lung bases clear. Electronically  Signed   By: Lowella Grip III M.D.   On: 07/06/2015 12:20    Assessment & Plan:   [name] was seen today for annual exam and hyperlipidemia.  Diagnoses and all orders for this visit:  Other abnormal glucose- his A1c is up to 6.3%, he is prediabetic, no medications are needed at this time, he will continue to work on his lifestyle modifications. -     Hemoglobin A1c; Future  Routine general medical examination at a health care facility- exam completed, labs ordered and reviewed, vaccines reviewed and updated, his Framingham risk score is only 6% so I do not recommend a statin, he does not want to undergo colonoscopy so I have ordered Cologuard to screen for colorectal cancer, he was given patient education material. -     Lipid panel; Future -     Comprehensive metabolic panel; Future -     TSH; Future -     PSA; Future -     Urinalysis, Routine w reflex microscopic (not at [facility]); Future -     HIV antibody; Future  Need for hepatitis C screening test -     Hepatitis C antibody; Future  Need for vaccination with 13-polyvalent pneumococcal conjugate vaccine -     Pneumococcal conjugate vaccine 13-valent IM  I am having Devon Barron maintain his cholecalciferol, LEVITRA, INTERFERON BETA-1A, tadalafil, clobetasol cream, and REBIF.  No orders of the defined types were placed in this encounter.     Follow-up: Return if symptoms worsen or fail to improve.  Scarlette Calico, MD

## 2015-12-29 ENCOUNTER — Encounter: Payer: Self-pay | Admitting: Internal Medicine

## 2015-12-29 LAB — HEPATITIS C ANTIBODY: HCV Ab: NEGATIVE

## 2015-12-29 LAB — HIV ANTIBODY (ROUTINE TESTING W REFLEX): HIV: NONREACTIVE

## 2016-01-08 ENCOUNTER — Encounter: Payer: Self-pay | Admitting: Neurology

## 2016-01-08 NOTE — Telephone Encounter (Signed)
Please see mychart message. Pt aware that provider is out of office until tomorrow.

## 2016-01-12 ENCOUNTER — Telehealth: Payer: Self-pay

## 2016-01-12 NOTE — Telephone Encounter (Signed)
Pt aware. He was relieved that he can now consider letting employer know diagnosis for a little longer. Will call back if forms are not detailed enough for [employer].

## 2016-01-12 NOTE — Telephone Encounter (Signed)
I filled the papers out the best I can without mentioning anything about his MS.  If it is still a problem, then he must first find out what their issue is with the completed forms.  If it appears that if explaining he has MS will help, then we can refill the forms stating this.

## 2016-01-12 NOTE — Telephone Encounter (Signed)
Spoke to patient about paperwork. He's previously not wanted/allowed [place] to inform his employer of his diagnosis. We have filled out multiple forms for his employer and the forms continue to seem to ask for more and more detail. Provider felt that the latest form was requesting too many details about illness to continue to be vague. Contacted pt to see if he wanted provider to continue to be vague or if he would like [place] to fill form out with diagnosis written out. Pt wary, but did agree that it was probably time to "let the cat out of the bag".

## 2016-02-23 ENCOUNTER — Other Ambulatory Visit (INDEPENDENT_AMBULATORY_CARE_PROVIDER_SITE_OTHER): Payer: BLUE CROSS/BLUE SHIELD

## 2016-02-23 ENCOUNTER — Telehealth: Payer: Self-pay

## 2016-02-23 DIAGNOSIS — G35 Multiple sclerosis: Secondary | ICD-10-CM | POA: Diagnosis not present

## 2016-02-23 LAB — CBC WITH DIFFERENTIAL/PLATELET
BASOS PCT: 0.4 % (ref 0.0–3.0)
Basophils Absolute: 0 10*3/uL (ref 0.0–0.1)
EOS ABS: 0.2 10*3/uL (ref 0.0–0.7)
Eosinophils Relative: 3.3 % (ref 0.0–5.0)
HEMATOCRIT: 43.6 % (ref 39.0–52.0)
Hemoglobin: 14.2 g/dL (ref 13.0–17.0)
LYMPHS ABS: 1 10*3/uL (ref 0.7–4.0)
LYMPHS PCT: 19.3 % (ref 12.0–46.0)
MCHC: 32.6 g/dL (ref 30.0–36.0)
MCV: 86.7 fl (ref 78.0–100.0)
Monocytes Absolute: 0.8 10*3/uL (ref 0.1–1.0)
Monocytes Relative: 15.2 % — ABNORMAL HIGH (ref 3.0–12.0)
NEUTROS ABS: 3.1 10*3/uL (ref 1.4–7.7)
Neutrophils Relative %: 61.8 % (ref 43.0–77.0)
PLATELETS: 188 10*3/uL (ref 150.0–400.0)
RBC: 5.03 Mil/uL (ref 4.22–5.81)
RDW: 13.9 % (ref 11.5–15.5)
WBC: 5 10*3/uL (ref 4.0–10.5)

## 2016-02-23 LAB — HEPATIC FUNCTION PANEL
ALBUMIN: 4.3 g/dL (ref 3.5–5.2)
ALT: 21 U/L (ref 0–53)
AST: 20 U/L (ref 0–37)
Alkaline Phosphatase: 51 U/L (ref 39–117)
BILIRUBIN DIRECT: 0.1 mg/dL (ref 0.0–0.3)
TOTAL PROTEIN: 7.8 g/dL (ref 6.0–8.3)
Total Bilirubin: 0.5 mg/dL (ref 0.2–1.2)

## 2016-02-23 NOTE — Telephone Encounter (Signed)
Pt aware. Will come in at earliest opportunity to have drawn.

## 2016-02-23 NOTE — Telephone Encounter (Signed)
-----   Message from Amada Kingfisher, [place] sent at 09/26/2015  3:55 PM EST ----- Regarding: Blood work CBC w/ diff, LFT orders in

## 2016-03-06 ENCOUNTER — Telehealth: Payer: Self-pay | Admitting: Neurology

## 2016-03-06 NOTE — Telephone Encounter (Signed)
I spoke with Devon Barron and she said that she needs a date on the form you filled out.

## 2016-03-06 NOTE — Telephone Encounter (Signed)
VM-Mrs.Clinger with  called in regards to PT and would like some clarification on PT/Dawn CB#[phone]

## 2016-03-08 NOTE — Telephone Encounter (Signed)
Will call Mrs. Clinger after 9 a.m. If she is speaking of the Work Status Form it was completed 01/03/16.

## 2016-03-08 NOTE — Telephone Encounter (Signed)
Called and left Mrs. Clinger a voicemail.

## 2016-03-18 IMAGING — MR MR HEAD WO/W CM
11 of 17 series · 27 of 48 positions shown · IV contrast (multihance)
Comparison: 07/13/2013 MR brain and 10/10/2013 cervical spine MR..

CLINICAL DATA: Known multiple sclerosis. Visual difficulties and
left-sided numbness.

EXAM:
MRI HEAD WITHOUT AND WITH CONTRAST
TECHNIQUE: Multiplanar, multiecho pulse sequences of the brain and surrounding
structures were obtained without and with intravenous contrast.
CONTRAST:  20mL MULTIHANCE GADOBENATE DIMEGLUMINE 529 MG/ML IV SOLN

[Series 3: T1 · sagittal · 5.0mm · 0.47mm/px · 2 of 23 slices shown]
[im 1/23]
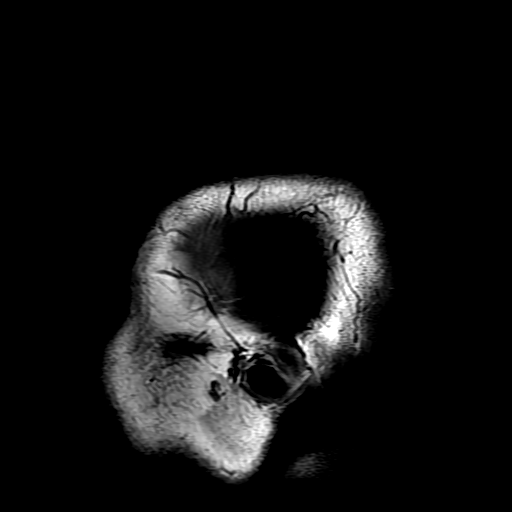
[im 23/23]
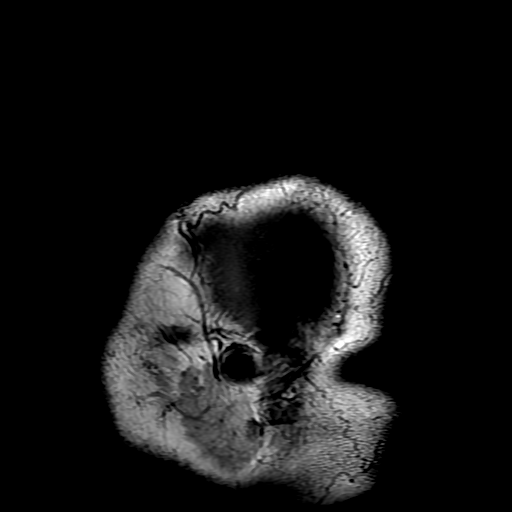

[Series 4: DWI · axial · 5.0mm · 1.09mm/px · z∈[-65,+79]mm · 3 of 60 slices shown (1 of 4)]
[im 1/60]
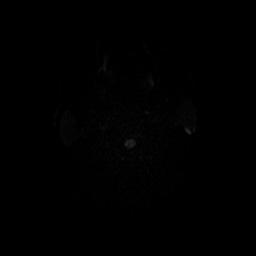
[im 30/60]
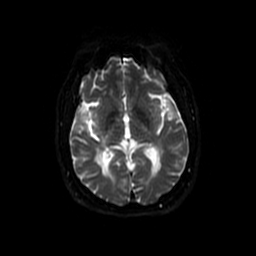
[im 60/60]
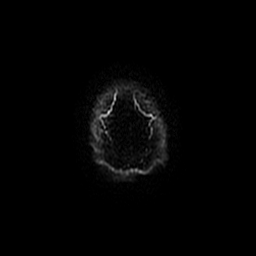

[Series 5: T2 · axial · 5.0mm · 0.43mm/px · 1 of 25 slices shown]
[im 1/25]
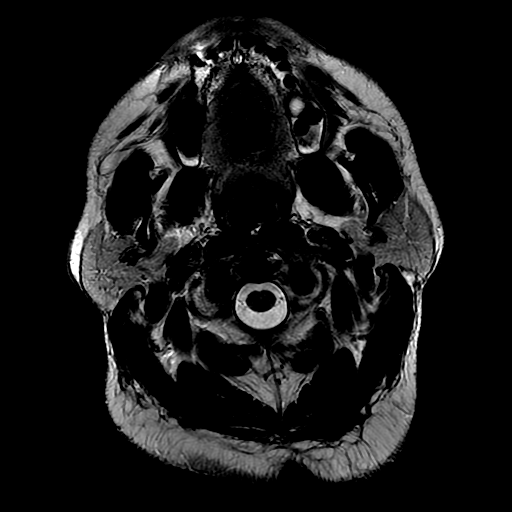

[Series 6: DWI · coronal · 5.0mm · 1.09mm/px · 4 of 66 slices shown (2 of 4)]
[im 1/66]
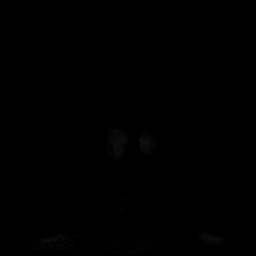
[im 22/66]
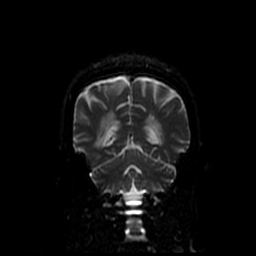
[im 44/66]
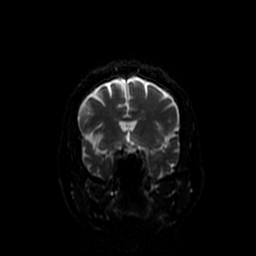
[im 66/66]
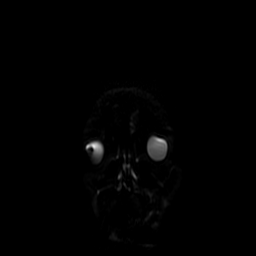

[Series 7: FLAIR · axial · 5.0mm · 0.43mm/px · 1 of 21 slices shown (1 of 2)]
[im 1/21]
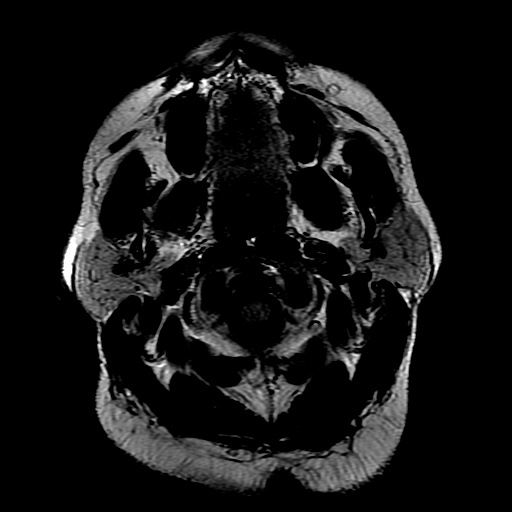

[Series 10: FLAIR · sagittal · 1.2mm · 0.49mm/px · 9 of 220 slices shown (2 of 2)]
[im 1/220]
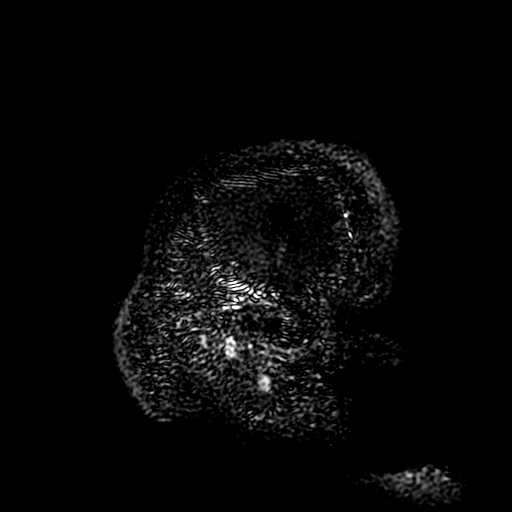
[im 40/220]
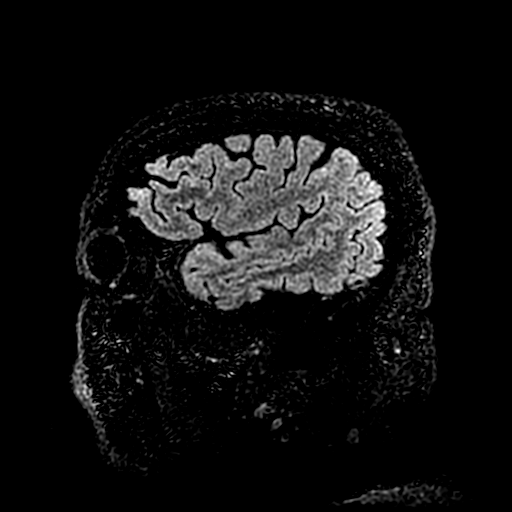
[im 60/220]
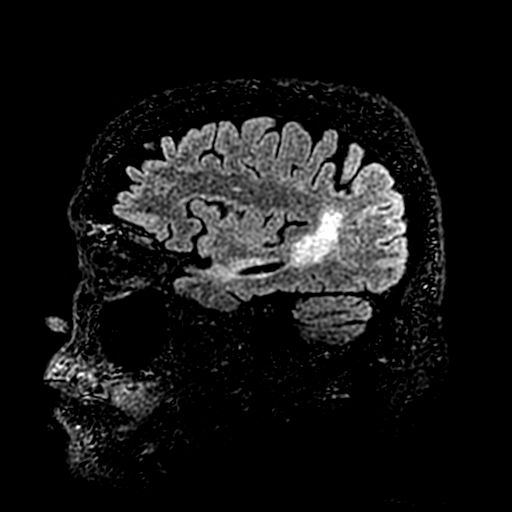
[im 100/220]
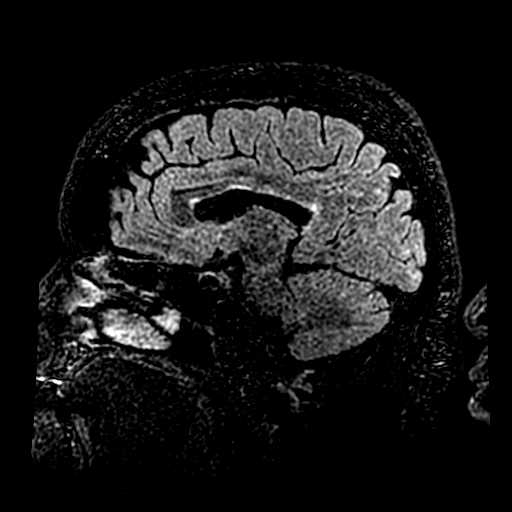
[im 120/220]
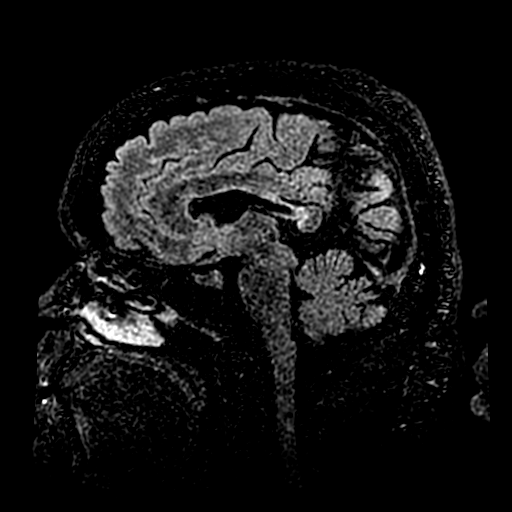
[im 160/220]
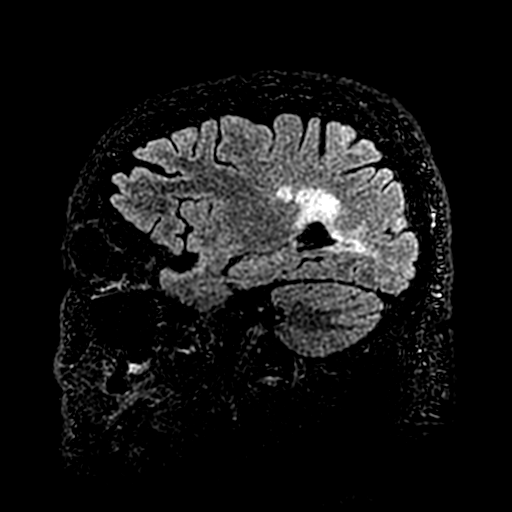
[im 180/220]
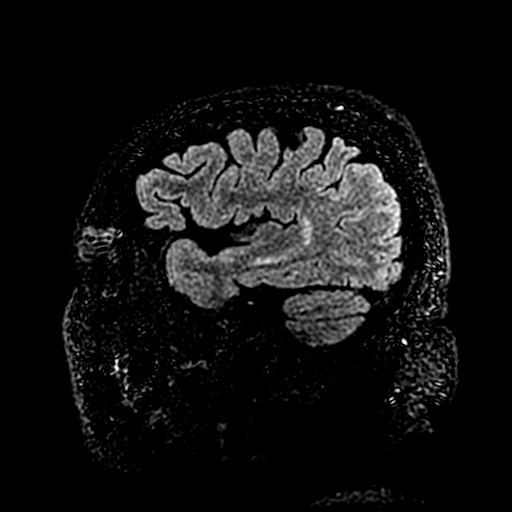
[im 200/220]
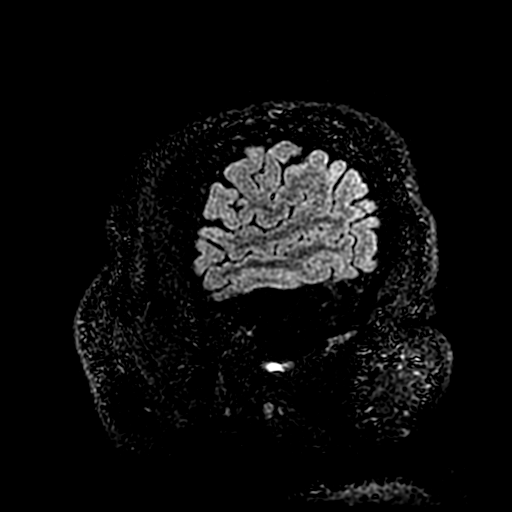
[im 220/220]
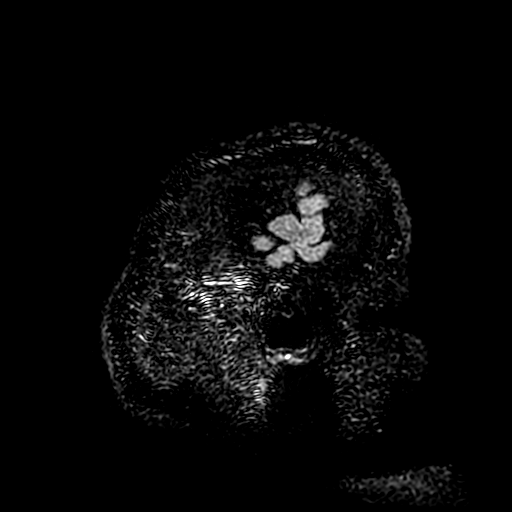

[Series 11: T2 post-contrast · coronal · 5.0mm · 0.39mm/px · 1 of 26 slices shown]
[im 1/26]
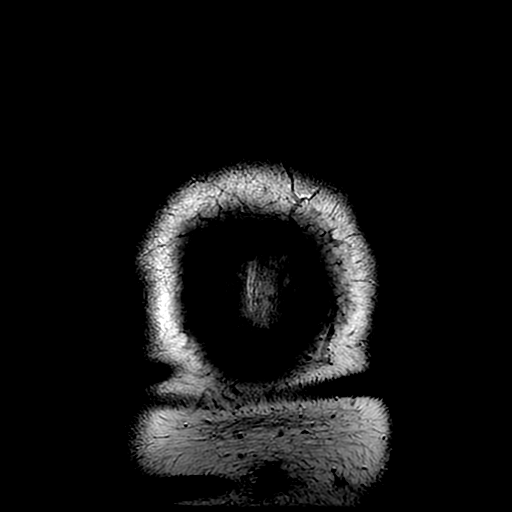

[Series 13: T1 post-contrast · coronal · 5.0mm · 0.39mm/px · 1 of 26 slices shown (1 of 2)]
[im 1/26]
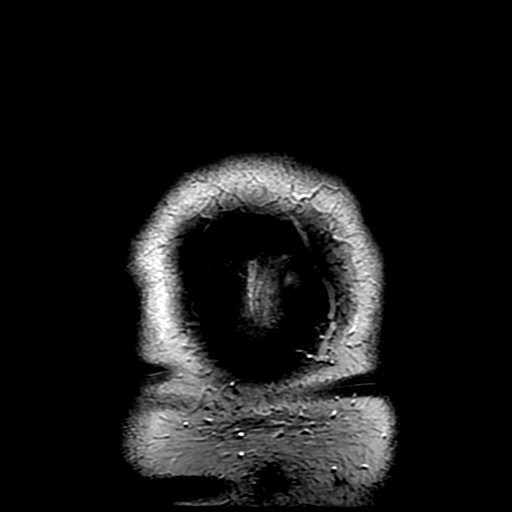

[Series 14: T1 post-contrast · sagittal · 5.0mm · 0.47mm/px · 1 of 23 slices shown (2 of 2)]
[im 1/23]
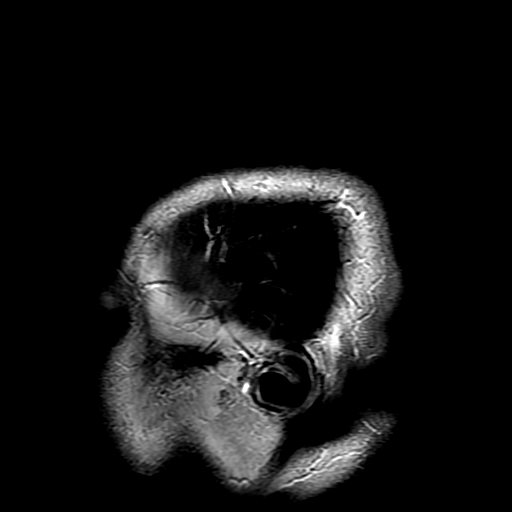

[Series 400: DWI · axial · 5.0mm · 1.09mm/px · z∈[-65,+79]mm · 2 of 30 slices shown (3 of 4)]
[im 1/30]
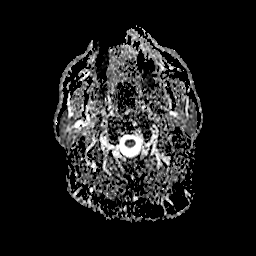
[im 30/30]
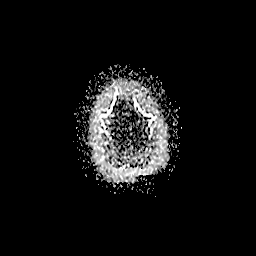

[Series 600: DWI · coronal · 5.0mm · 1.09mm/px · 2 of 33 slices shown (4 of 4)]
[im 1/33]
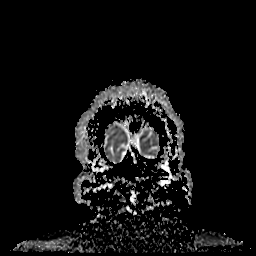
[im 33/33]
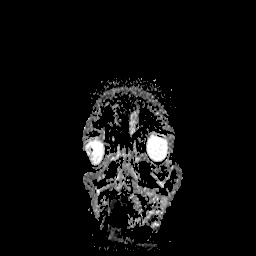

[27 of 48 positions shown; findings below may reference images not displayed]

FINDINGS: No acute infarct.

Multiple supratentorial and infratentorial white matter lesions
consistent with patient's history of multiple sclerosis none of
which are demonstrating restricted motion or enhancement as can be
seen with areas of active demyelination. No new lesion detected when
compared to the prior exam.

No growth of dominant lesion to suggest progressive multifocal
leukoencephalopathy.

On non dedicated imaging through orbits, no discrete optic nerve
lesion is noted. Mild exophthalmos.

Plaque dorsal aspect the cervical cord at C2 level as noted on 5285
cervical spine MR.

No intracranial hemorrhage.

No hydrocephalus.

Major intracranial vascular structures are patent.
IMPRESSION: No new demyelinating plaque or MR findings of active demyelination
detected as detailed above.

## 2016-03-22 ENCOUNTER — Encounter: Payer: Self-pay | Admitting: Neurology

## 2016-03-28 ENCOUNTER — Encounter: Payer: Self-pay | Admitting: Neurology

## 2016-03-28 ENCOUNTER — Ambulatory Visit (INDEPENDENT_AMBULATORY_CARE_PROVIDER_SITE_OTHER): Payer: BLUE CROSS/BLUE SHIELD | Admitting: Neurology

## 2016-03-28 VITALS — BP 122/70 | HR 92 | Ht 67.0 in | Wt 196.0 lb

## 2016-03-28 DIAGNOSIS — G35 Multiple sclerosis: Secondary | ICD-10-CM | POA: Diagnosis not present

## 2016-03-28 NOTE — Progress Notes (Signed)
NEUROLOGY FOLLOW UP OFFICE NOTE  Devon Barron [id number]  HISTORY OF PRESENT ILLNESS: Devon Barron is a 55 year old right-handed man with possible OSA, who follows up for multiple sclerosis.  MRI images of the brain from 02/14/15 were personally reviewed.   UPDATE:     He is taking Rebif.   He has not been compliant with taking his D3 4000 IU daily. He is physically doing well.. Labs from July:  CBC with WBC 5, HGB 14.2, HCT 43.6 and PLT 188; LFTs with TB 0.5, ALP 51, AST 20 and ALT 21.   HISTORY: He was diagnosed with MS in 2001.  At that time, he presented with left facial myokymia. He reports that he hasn't had many flare ups, and only a couple of flare-ups requiring pulse steroids.  They typically present as blurred vision, headache, dizziness, unsteadiness and fatigue.  He has both supratentorial and infratentorial demyelinating lesions.  His cervical cord is okay.  He has a history of lapses with physician follow-ups and non-adherence to disease-modifying agents, mostly due to financial reasons.  He previously was on Avonex, and then Rebif.  Due to financial reasons, he has not followed up with a neurologist in between 2013 and 2014 and had not taken his Rebif.  He developed increased fatigue, stiffness, blurred vision and intermittent headaches, similar to prior flare ups.  He presented to the ED on 05/03/13.  Physical exam revealed slight right sided facial droop.  Initially, a CT of the head was performed and revealed decreased attenuation at the right subfrontal-anterior temporal junction.  MRI Brain w/wo contrast was performed, and revealed extensive periventricular and subcortical T2 hyperintensities, as well as focal restricted diffusion within the splenium of the corpus callosum, concerning for active demyelination.  He has since restarted Rebif.   Chronic symptoms include mild fatigue, but if he gets 10 hours of sleep, he feels good.  He notes occasional urinary frequency but noting  too concerning.  Transient symptoms include weakness, blurred vision and headache.   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   MRI of brain with and without contrast from 02/14/15 unchanged compared to scans from 03/08/14 and 07/13/13.     PAST MEDICAL HISTORY: Past Medical History:  Diagnosis Date  . MS (multiple sclerosis) ([place])     MEDICATIONS: Current Outpatient Prescriptions on File Prior to Visit  Medication Sig Dispense Refill  . cholecalciferol (VITAMIN D) 1000 UNITS tablet Take 1,000 Units by mouth daily.    . clobetasol cream (TEMOVATE) 0.05 % apply to affected area twice a day  0  . INTERFERON BETA-1A Inject 44 mcg into the skin.    Marland Kitchen LEVITRA 20 MG tablet     . REBIF 44 MCG/0.5ML SOSY injection INJECT 44MCG SUBCUTANEOUSLY THREE TIMES PER WEEK 12 Syringe 5  . tadalafil (CIALIS) 5 MG tablet Take 1 tablet one hour prior to sexual activity.     No current facility-administered medications on file prior to visit.     ALLERGIES: No Known Allergies  FAMILY HISTORY: Family History  Problem Relation Age of Onset  . Ataxia Neg Hx   . Chorea Neg Hx   . Dementia Neg Hx   . Mental retardation Neg Hx   . Migraines Neg Hx   . Multiple sclerosis Neg Hx   . Neurofibromatosis Neg Hx   . Neuropathy Neg Hx   . Parkinsonism Neg Hx   . Seizures Neg Hx   . Stroke Neg  Hx   . Early death Neg Hx   . Cancer Neg Hx   . Alcohol abuse Neg Hx   . Hearing loss Neg Hx   . Heart disease Neg Hx   . Hyperlipidemia Neg Hx   . Kidney disease Neg Hx   . Hypertension Mother   . Hypertension Father   . Diabetes Sister   . Diabetes Brother     SOCIAL HISTORY: Social History   Social History  . Marital status: Single    Spouse name: N/A  . Number of children: N/A  . Years of education: N/A   Occupational History  . Not on file.   Social History Main Topics  . Smoking status: Never Smoker  . Smokeless tobacco: Never Used  . Alcohol use 3.0 oz/week    5  Cans of beer per week     Comment: Occ  . Drug use: No  . Sexual activity: Yes    Partners: Female   Other Topics Concern  . Not on file   Social History Narrative  . No narrative on file    REVIEW OF SYSTEMS: Constitutional: No fevers, chills, or sweats, no generalized fatigue, change in appetite Eyes: No visual changes, double vision, eye pain Ear, nose and throat: No hearing loss, ear pain, nasal congestion, sore throat Cardiovascular: No chest pain, palpitations Respiratory:  No shortness of breath at rest or with exertion, wheezes GastrointestinaI: No nausea, vomiting, diarrhea, abdominal pain, fecal incontinence Genitourinary:  No dysuria, urinary retention or frequency Musculoskeletal:  No neck pain, back pain Integumentary: vitiligo on face Neurological: as above Psychiatric: No depression, insomnia, anxiety Endocrine: No palpitations, fatigue, diaphoresis, mood swings, change in appetite, change in weight, increased thirst Hematologic/Lymphatic:  No purpura, petechiae. Allergic/Immunologic: no itchy/runny eyes, nasal congestion, recent allergic reactions, rashes  PHYSICAL EXAM: Vitals:   03/28/16 1210  BP: 122/70  Pulse: 92   General: No acute distress.  Patient appears well-groomed.  normal body habitus. Head:  Normocephalic/atraumatic Eyes:  Fundi examined but not visualized Neck: supple, no paraspinal tenderness, full range of motion Heart:  Regular rate and rhythm Lungs:  Clear to auscultation bilaterally Back: No paraspinal tenderness Neurological Exam: alert and oriented to person, place, and time. Attention span and concentration intact, recent and remote memory intact, fund of knowledge intact.  Speech with stuttering but not dysarthric, language intact.  Mild right facial weakness, otherwise CN II-XII intact. Fundi not visualized.  Bulk and tone normal, muscle strength 5/5 throughout.  Sensation to light touch, temperature and vibration intact.  Deep tendon  reflexes 2+ throughout, toes downgoing.  Finger to nose and heel to shin testing intact.  Gait station and stride normal, able to walk in tandem, Timed 25 foot walk 5.02 seconds.  Romberg negative.  IMPRESSION: Relapsing-remitting MS, stable  PLAN: 1.  Rebif 2.  D3 4000 IU daily 3.  Recheck CBC with diff, LFTs and D level in 6 months.  Follow up soon afterwards.  26 minutes spent face to face with patient, over 50% spent counseling.  Metta Clines, DO  CC:  Scarlette Calico, MD

## 2016-03-28 NOTE — Patient Instructions (Signed)
1.  Continue Rebif 2.  Take vitamin D3 4000 IU daily 3.  Repeat CBC with diff, LFTs and D level in 6 months 4.  Follow up afterwards in 6 months.

## 2016-03-29 ENCOUNTER — Ambulatory Visit: Payer: BLUE CROSS/BLUE SHIELD | Admitting: Neurology

## 2016-04-02 ENCOUNTER — Other Ambulatory Visit (INDEPENDENT_AMBULATORY_CARE_PROVIDER_SITE_OTHER): Payer: BLUE CROSS/BLUE SHIELD

## 2016-04-02 ENCOUNTER — Encounter: Payer: Self-pay | Admitting: *Deleted

## 2016-04-02 DIAGNOSIS — G35 Multiple sclerosis: Secondary | ICD-10-CM

## 2016-04-02 LAB — CBC WITH DIFFERENTIAL/PLATELET
BASOS ABS: 0 10*3/uL (ref 0.0–0.1)
BASOS PCT: 0.6 % (ref 0.0–3.0)
EOS ABS: 0.1 10*3/uL (ref 0.0–0.7)
Eosinophils Relative: 2 % (ref 0.0–5.0)
HEMATOCRIT: 44.1 % (ref 39.0–52.0)
Hemoglobin: 14.5 g/dL (ref 13.0–17.0)
Lymphocytes Relative: 22.8 % (ref 12.0–46.0)
Lymphs Abs: 1.2 10*3/uL (ref 0.7–4.0)
MCHC: 32.8 g/dL (ref 30.0–36.0)
MCV: 86.5 fl (ref 78.0–100.0)
Monocytes Absolute: 0.8 10*3/uL (ref 0.1–1.0)
Monocytes Relative: 15.4 % — ABNORMAL HIGH (ref 3.0–12.0)
NEUTROS ABS: 3.2 10*3/uL (ref 1.4–7.7)
Neutrophils Relative %: 59.2 % (ref 43.0–77.0)
Platelets: 211 10*3/uL (ref 150.0–400.0)
RBC: 5.1 Mil/uL (ref 4.22–5.81)
RDW: 14 % (ref 11.5–15.5)
WBC: 5.4 10*3/uL (ref 4.0–10.5)

## 2016-04-02 LAB — HEPATIC FUNCTION PANEL
ALBUMIN: 4.2 g/dL (ref 3.5–5.2)
ALT: 21 U/L (ref 0–53)
AST: 18 U/L (ref 0–37)
Alkaline Phosphatase: 49 U/L (ref 39–117)
Bilirubin, Direct: 0 mg/dL (ref 0.0–0.3)
TOTAL PROTEIN: 7.7 g/dL (ref 6.0–8.3)
Total Bilirubin: 0.4 mg/dL (ref 0.2–1.2)

## 2016-04-02 LAB — VITAMIN D 25 HYDROXY (VIT D DEFICIENCY, FRACTURES): VITD: 21.15 ng/mL — AB (ref 30.00–100.00)

## 2016-05-22 ENCOUNTER — Other Ambulatory Visit: Payer: Self-pay | Admitting: Neurology

## 2016-08-29 ENCOUNTER — Emergency Department (HOSPITAL_BASED_OUTPATIENT_CLINIC_OR_DEPARTMENT_OTHER)
Admission: EM | Admit: 2016-08-29 | Discharge: 2016-08-29 | Disposition: A | Discharge status: Home / Self Care | Payer: BLUE CROSS/BLUE SHIELD | Attending: Emergency Medicine | Admitting: Emergency Medicine

## 2016-08-29 ENCOUNTER — Encounter (HOSPITAL_BASED_OUTPATIENT_CLINIC_OR_DEPARTMENT_OTHER): Payer: Self-pay | Admitting: *Deleted

## 2016-08-29 DIAGNOSIS — Y929 Unspecified place or not applicable: Secondary | ICD-10-CM | POA: Insufficient documentation

## 2016-08-29 DIAGNOSIS — Y9389 Activity, other specified: Secondary | ICD-10-CM | POA: Insufficient documentation

## 2016-08-29 DIAGNOSIS — Y990 Civilian activity done for income or pay: Secondary | ICD-10-CM | POA: Insufficient documentation

## 2016-08-29 DIAGNOSIS — S39012A Strain of muscle, fascia and tendon of lower back, initial encounter: Secondary | ICD-10-CM | POA: Insufficient documentation

## 2016-08-29 DIAGNOSIS — Z79899 Other long term (current) drug therapy: Secondary | ICD-10-CM | POA: Insufficient documentation

## 2016-08-29 DIAGNOSIS — X503XXA Overexertion from repetitive movements, initial encounter: Secondary | ICD-10-CM | POA: Insufficient documentation

## 2016-08-29 DIAGNOSIS — R0981 Nasal congestion: Secondary | ICD-10-CM | POA: Insufficient documentation

## 2016-08-29 DIAGNOSIS — R05 Cough: Secondary | ICD-10-CM | POA: Insufficient documentation

## 2016-08-29 MED ORDER — IBUPROFEN 800 MG PO TABS: 800.0000 mg | ORAL_TABLET | Freq: Three times a day (TID) | ORAL | 0 refills | Status: DC

## 2016-08-29 MED ORDER — HYDROCODONE-ACETAMINOPHEN 5-325 MG PO TABS: 1.0000 | ORAL_TABLET | Freq: Four times a day (QID) | ORAL | 0 refills | Status: DC | PRN

## 2016-08-29 MED ORDER — HYDROCODONE-ACETAMINOPHEN 5-325 MG PO TABS
1.0000 | ORAL_TABLET | Freq: Once | ORAL | Status: AC
  Administered 2016-08-29: 1 via ORAL
  Filled 2016-08-29: qty 1

## 2016-08-29 MED ORDER — CYCLOBENZAPRINE HCL 10 MG PO TABS: 10.0000 mg | ORAL_TABLET | Freq: Two times a day (BID) | ORAL | 0 refills | Status: DC | PRN

## 2016-08-29 NOTE — Discharge Instructions (Signed)
Symptoms consistent with a low back strain. Work note provided to be out of work until Monday. Rest as much as possible. Take the Motrin on a regular basis for the next several days. Take the Flexeril which is a muscle relaxer. Substitute with the hydrocodone as needed for additional pain relief. Follow-up with your doctor or return here for any new or worse symptoms

## 2016-08-29 NOTE — ED Provider Notes (Signed)
[facility] DEPT [facility] Provider Note   CSN: [id number] Arrival date & time: 08/29/16  1809  By signing my name below, I, Dyke Brackett, attest that this documentation has been prepared under the direction and in the presence of Fredia Sorrow, MD . Electronically Signed: Dyke Brackett, Scribe. 08/29/2016. 9:11 PM.   History   Chief Complaint Chief Complaint  Patient presents with  . Back Pain  . Hip Pain   HPI Devon Barron is a 56 y.o. male with a history of MS who presents to the Emergency Department complaining of bilateral back pain (R >L)  which he has had for a while, but significantly worsened last night. Pt states he works at [employer] and steps in and out of a truck throughout the day. He describes his pain as 8/10, pulling pain that is exacerbated by sitting and alleviating by standing. No prior treatments indicated. Pt notes associated right hip pain that does not radiate. Pt also reports cough and congestion this week for which he has taken Theraflu. He denies use of blood thinners. Pt denies any numbness to his feet or toes, weakness, fever, chills, rhinorrhea, sore throat, visual disturbance, SOB, CP, n/v/d, abdominal pain, dysuria, hematuria, joint swelling, or headaches.    The history is provided by the patient. No language interpreter was used.   Past Medical History:  Diagnosis Date  . MS (multiple sclerosis) [facility])     Patient Active Problem List   Diagnosis Date Noted  . Need for hepatitis C screening test 12/28/2015  . GERD (gastroesophageal reflux disease) 09/17/2013  . Cervical radiculitis 09/17/2013  . Other abnormal glucose 09/03/2013  . Routine general medical examination at a health care facility 09/03/2013  . Nonspecific abnormal electrocardiogram (ECG) (EKG) 09/03/2013  . Relapsing remitting multiple sclerosis ([place]) 07/28/2013    Past Surgical History:  Procedure Laterality Date  . HERNIA REPAIR       Home Medications    Prior to Admission  medications   Medication Sig Start Date End Date Taking? Authorizing Provider  cholecalciferol (VITAMIN D) 1000 UNITS tablet Take 1,000 Units by mouth daily.    Historical Provider, MD  clobetasol cream (TEMOVATE) 0.05 % apply to affected area twice a day 02/21/15   Historical Provider, MD  cyclobenzaprine (FLEXERIL) 10 MG tablet Take 1 tablet (10 mg total) by mouth 2 (two) times daily as needed for muscle spasms. 08/29/16   Fredia Sorrow, MD  HYDROcodone-acetaminophen (NORCO/VICODIN) 5-325 MG tablet Take 1-2 tablets by mouth every 6 (six) hours as needed for moderate pain. 08/29/16   Fredia Sorrow, MD  ibuprofen (ADVIL,MOTRIN) 800 MG tablet Take 1 tablet (800 mg total) by mouth 3 (three) times daily. 08/29/16   Fredia Sorrow, MD  INTERFERON BETA-1A Inject 44 mcg into the skin.    Historical Provider, MD  LEVITRA 20 MG tablet  07/09/13   Historical Provider, MD  REBIF 44 MCG/0.5ML SOSY injection INJECT 44MCG SUBCUTANEOUSLY THREE TIMES PER WEEK 05/22/16   Pieter Partridge, DO  tadalafil (CIALIS) 5 MG tablet Take 1 tablet one hour prior to sexual activity. 02/20/15   Historical Provider, MD    Family History Family History  Problem Relation Age of Onset  . Hypertension Mother   . Hypertension Father   . Diabetes Sister   . Diabetes Brother   . Ataxia Neg Hx   . Chorea Neg Hx   . Dementia Neg Hx   . Mental retardation Neg Hx   . Migraines Neg Hx   .  Multiple sclerosis Neg Hx   . Neurofibromatosis Neg Hx   . Neuropathy Neg Hx   . Parkinsonism Neg Hx   . Seizures Neg Hx   . Stroke Neg Hx   . Early death Neg Hx   . Cancer Neg Hx   . Alcohol abuse Neg Hx   . Hearing loss Neg Hx   . Heart disease Neg Hx   . Hyperlipidemia Neg Hx   . Kidney disease Neg Hx     Social History Social History  Substance Use Topics  . Smoking status: Never Smoker  . Smokeless tobacco: Never Used  . Alcohol use 3.0 oz/week    5 Cans of beer per week     Comment: Occ    Allergies   Patient has no known  allergies.   Review of Systems Review of Systems  Constitutional: Negative for chills and fever.  HENT: Positive for congestion. Negative for rhinorrhea and sore throat.   Eyes: Negative for visual disturbance.  Respiratory: Positive for cough. Negative for shortness of breath.   Cardiovascular: Negative for chest pain.  Gastrointestinal: Negative for abdominal pain, diarrhea, nausea and vomiting.  Genitourinary: Negative for dysuria and hematuria.  Musculoskeletal: Positive for back pain and myalgias.  Skin: Negative for rash.  Neurological: Negative for numbness and headaches.  Hematological: Does not bruise/bleed easily.  Psychiatric/Behavioral: Negative for confusion.    Physical Exam Updated Vital Signs BP 151/95 (BP Location: Left Arm)   Pulse 92   Temp 98.5 F (36.9 C) (Oral)   Resp 18   Ht 5\' 9"  (1.753 m)   Wt 87.1 kg   SpO2 100%   BMI 28.35 kg/m   Physical Exam  Constitutional: He is oriented to person, place, and time. He appears well-developed and well-nourished. No distress.  HENT:  Head: Normocephalic and atraumatic.  Moist mucus membranes  Eyes: Conjunctivae and EOM are normal. Pupils are equal, round, and reactive to light. No scleral icterus.  Cardiovascular: Normal rate and regular rhythm.   Pulmonary/Chest: Effort normal and breath sounds normal. No respiratory distress. He has no wheezes. He has no rales.  Abdominal: Bowel sounds are normal. He exhibits no distension. There is no tenderness.  Musculoskeletal: He exhibits tenderness.  Tenderness to right lower back.  Neurological: He is alert and oriented to person, place, and time. No cranial nerve deficit or sensory deficit. He exhibits normal muscle tone. Coordination normal.  Skin: Skin is warm and dry.  Psychiatric: He has a normal mood and affect.  Nursing note and vitals reviewed.  ED Treatments / Results  DIAGNOSTIC STUDIES:  Oxygen Saturation is 100% on RA, normal by my interpretation.     COORDINATION OF CARE:  9:16 PM Discussed treatment plan with pt at bedside and pt agreed to plan.  Labs (all labs ordered are listed, but only abnormal results are displayed) Labs Reviewed - No data to display  EKG  EKG Interpretation None       Radiology No results found.  Procedures Procedures (including critical care time)  Medications Ordered in ED Medications  HYDROcodone-acetaminophen (NORCO/VICODIN) 5-325 MG per tablet 1 tablet (not administered)     Initial Impression / Assessment and Plan / ED Course  I have reviewed the triage vital signs and the nursing notes.  Pertinent labs & imaging results that were available during my care of the patient were reviewed by me and considered in my medical decision making (see chart for details).     A symptoms consistent  with a low back strain that probably occurred at work last evening. Patient without any sciatica symptoms. Work note provided we'll treat symptomatically.  Final Clinical Impressions(s) / ED Diagnoses   Final diagnoses:  Strain of lumbar region, initial encounter    New Prescriptions New Prescriptions   CYCLOBENZAPRINE (FLEXERIL) 10 MG TABLET    Take 1 tablet (10 mg total) by mouth 2 (two) times daily as needed for muscle spasms.   HYDROCODONE-ACETAMINOPHEN (NORCO/VICODIN) 5-325 MG TABLET    Take 1-2 tablets by mouth every 6 (six) hours as needed for moderate pain.   IBUPROFEN (ADVIL,MOTRIN) 800 MG TABLET    Take 1 tablet (800 mg total) by mouth 3 (three) times daily.   I personally performed the services described in this documentation, which was scribed in my presence. The recorded information has been reviewed and is accurate.      Fredia Sorrow, MD 08/29/16 2119

## 2016-08-29 NOTE — ED Notes (Addendum)
C/o low back pain radiating to left hip for some time  Worse x 2 days,  In and out of a truck all day  Has not taken any otc pain meds

## 2016-08-29 NOTE — ED Triage Notes (Signed)
Back and right hip pain. States he works for [employer] and steps in and out of a truck all day.

## 2016-09-09 ENCOUNTER — Encounter: Payer: Self-pay | Admitting: Neurology

## 2016-10-01 ENCOUNTER — Other Ambulatory Visit: Payer: Self-pay | Admitting: *Deleted

## 2016-10-01 ENCOUNTER — Other Ambulatory Visit (INDEPENDENT_AMBULATORY_CARE_PROVIDER_SITE_OTHER): Payer: BLUE CROSS/BLUE SHIELD

## 2016-10-01 DIAGNOSIS — G35 Multiple sclerosis: Secondary | ICD-10-CM | POA: Diagnosis not present

## 2016-10-01 DIAGNOSIS — Z79899 Other long term (current) drug therapy: Secondary | ICD-10-CM | POA: Diagnosis not present

## 2016-10-01 LAB — CBC WITH DIFFERENTIAL/PLATELET
BASOS ABS: 0 10*3/uL (ref 0.0–0.1)
Basophils Relative: 0.7 % (ref 0.0–3.0)
Eosinophils Absolute: 0.1 10*3/uL (ref 0.0–0.7)
Eosinophils Relative: 1.3 % (ref 0.0–5.0)
HCT: 42.9 % (ref 39.0–52.0)
Hemoglobin: 14.1 g/dL (ref 13.0–17.0)
LYMPHS ABS: 1.4 10*3/uL (ref 0.7–4.0)
Lymphocytes Relative: 21.6 % (ref 12.0–46.0)
MCHC: 32.8 g/dL (ref 30.0–36.0)
MCV: 87 fl (ref 78.0–100.0)
MONO ABS: 0.5 10*3/uL (ref 0.1–1.0)
Monocytes Relative: 7.5 % (ref 3.0–12.0)
NEUTROS PCT: 68.9 % (ref 43.0–77.0)
Neutro Abs: 4.4 10*3/uL (ref 1.4–7.7)
Platelets: 212 10*3/uL (ref 150.0–400.0)
RBC: 4.94 Mil/uL (ref 4.22–5.81)
RDW: 14 % (ref 11.5–15.5)
WBC: 6.5 10*3/uL (ref 4.0–10.5)

## 2016-10-01 LAB — HEPATIC FUNCTION PANEL
ALT: 24 U/L (ref 0–53)
AST: 20 U/L (ref 0–37)
Albumin: 4.1 g/dL (ref 3.5–5.2)
Alkaline Phosphatase: 47 U/L (ref 39–117)
BILIRUBIN TOTAL: 0.6 mg/dL (ref 0.2–1.2)
Bilirubin, Direct: 0.2 mg/dL (ref 0.0–0.3)
Total Protein: 7.3 g/dL (ref 6.0–8.3)

## 2016-10-02 ENCOUNTER — Encounter: Payer: Self-pay | Admitting: Neurology

## 2016-10-02 ENCOUNTER — Ambulatory Visit (INDEPENDENT_AMBULATORY_CARE_PROVIDER_SITE_OTHER): Payer: BLUE CROSS/BLUE SHIELD | Admitting: Neurology

## 2016-10-02 VITALS — BP 138/76 | HR 76 | Ht 69.0 in | Wt 197.0 lb

## 2016-10-02 DIAGNOSIS — G35 Multiple sclerosis: Secondary | ICD-10-CM | POA: Diagnosis not present

## 2016-10-02 NOTE — Patient Instructions (Signed)
1.  Continue the Rebif 2.  Remember to take D3 4000 IU daily 3.  We will repeat MRI of brain with and without contrast, CBC with diff, hepatic panel and vitamin D level in 6 months, with follow up soon afterwards. 4.  We will provide you a letter stating that you were getting labs drawn yesterday and had an office visit today. 5.  Follow up in 6 months after repeat testing.

## 2016-10-02 NOTE — Progress Notes (Signed)
NEUROLOGY FOLLOW UP OFFICE NOTE  CHASIN DONSBACH [id number]  HISTORY OF PRESENT ILLNESS: Devon Barron is a 56 year old right-handed man with possible OSA, who follows up for multiple sclerosis.     UPDATE:     He is taking Rebif.   He sometimes forgets to buy more D3.  He ran out 3 weeks ago and has not gotten around to buying another container.  Physically, he is doing well. Labs from yesterday:  CBC with WBC 6.5, HGB 14.1, HCT 42.9, PLT 212, ALC 1.4; hepatic panel with total bili 0.6, ALP 47, AST 20 and ALT 24.   HISTORY: He was diagnosed with MS in 2001.  At that time, he presented with left facial myokymia. He reports that he hasn't had many flare ups, and only a couple of flare-ups requiring pulse steroids.  They typically present as blurred vision, headache, dizziness, unsteadiness and fatigue.  He has both supratentorial and infratentorial demyelinating lesions.  His cervical cord is okay.  He has a history of lapses with physician follow-ups and non-adherence to disease-modifying agents, mostly due to financial reasons.  He previously was on Avonex, and then Rebif.  Due to financial reasons, he has not followed up with a neurologist in between 2013 and 2014 and had not taken his Rebif.  He developed increased fatigue, stiffness, blurred vision and intermittent headaches, similar to prior flare ups.  He presented to the ED on 05/03/13.  Physical exam revealed slight right sided facial droop.  Initially, a CT of the head was performed and revealed decreased attenuation at the right subfrontal-anterior temporal junction.  MRI Brain w/wo contrast was performed, and revealed extensive periventricular and subcortical T2 hyperintensities, as well as focal restricted diffusion within the splenium of the corpus callosum, concerning for active demyelination.  He has since restarted Rebif.   Chronic symptoms include mild fatigue, but if he gets 10 hours of sleep, he feels good.  He notes occasional  urinary frequency but noting too concerning.  Transient symptoms include weakness, blurred vision and headache.   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   MRI of brain with and without contrast from 02/14/15 unchanged compared to scans from 03/08/14 and 07/13/13.     PAST MEDICAL HISTORY: Past Medical History:  Diagnosis Date  . MS (multiple sclerosis) ([place])     MEDICATIONS: Current Outpatient Prescriptions on File Prior to Visit  Medication Sig Dispense Refill  . cholecalciferol (VITAMIN D) 1000 UNITS tablet Take 1,000 Units by mouth daily.    . clobetasol cream (TEMOVATE) 0.05 % apply to affected area twice a day  0  . LEVITRA 20 MG tablet     . REBIF 44 MCG/0.5ML SOSY injection INJECT 44MCG SUBCUTANEOUSLY THREE TIMES PER WEEK 12 Syringe 4  . tadalafil (CIALIS) 5 MG tablet Take 1 tablet one hour prior to sexual activity.     No current facility-administered medications on file prior to visit.     ALLERGIES: No Known Allergies  FAMILY HISTORY: Family History  Problem Relation Age of Onset  . Hypertension Mother   . Hypertension Father   . Diabetes Sister   . Diabetes Brother   . Ataxia Neg Hx   . Chorea Neg Hx   . Dementia Neg Hx   . Mental retardation Neg Hx   . Migraines Neg Hx   . Multiple sclerosis Neg Hx   . Neurofibromatosis Neg Hx   . Neuropathy Neg Hx   .  Parkinsonism Neg Hx   . Seizures Neg Hx   . Stroke Neg Hx   . Early death Neg Hx   . Cancer Neg Hx   . Alcohol abuse Neg Hx   . Hearing loss Neg Hx   . Heart disease Neg Hx   . Hyperlipidemia Neg Hx   . Kidney disease Neg Hx     SOCIAL HISTORY: Social History   Social History  . Marital status: Single    Spouse name: N/A  . Number of children: N/A  . Years of education: N/A   Occupational History  . Not on file.   Social History Main Topics  . Smoking status: Never Smoker  . Smokeless tobacco: Never Used  . Alcohol use 3.0 oz/week    5 Cans of beer per week      Comment: Occ  . Drug use: No  . Sexual activity: Yes    Partners: Female   Other Topics Concern  . Not on file   Social History Narrative  . No narrative on file    REVIEW OF SYSTEMS: Constitutional: No fevers, chills, or sweats, no generalized fatigue, change in appetite Eyes: No visual changes, double vision, eye pain Ear, nose and throat: No hearing loss, ear pain, nasal congestion, sore throat Cardiovascular: No chest pain, palpitations Respiratory:  No shortness of breath at rest or with exertion, wheezes GastrointestinaI: No nausea, vomiting, diarrhea, abdominal pain, fecal incontinence Genitourinary:  No dysuria, urinary retention or frequency Musculoskeletal:  No neck pain, back pain Integumentary: No rash, pruritus, skin lesions Neurological: as above Psychiatric: No depression, insomnia, anxiety Endocrine: No palpitations, fatigue, diaphoresis, mood swings, change in appetite, change in weight, increased thirst Hematologic/Lymphatic:  No purpura, petechiae. Allergic/Immunologic: no itchy/runny eyes, nasal congestion, recent allergic reactions, rashes  PHYSICAL EXAM: Vitals:   10/02/16 1108  BP: 138/76  Pulse: 76   General: No acute distress.  Patient appears well-groomed.  normal body habitus. Head:  Normocephalic/atraumatic Eyes:  Fundi examined but not visualized Neck: supple, no paraspinal tenderness, full range of motion Heart:  Regular rate and rhythm Lungs:  Clear to auscultation bilaterally Back: No paraspinal tenderness Neurological Exam: alert and oriented to person, place, and time. Attention span and concentration intact, recent and remote memory intact, fund of knowledge intact.  Speech with stuttering but not dysarthric, language intact.  Mild right facial weakness, otherwise CN II-XII intact. Fundi not visualized.  Bulk and tone normal, muscle strength 5/5 throughout.  Sensation to light touch, temperature and vibration intact.  Deep tendon reflexes 2+  throughout, toes downgoing.  Finger to nose and heel to shin testing intact.  Gait station and stride normal, able to walk in tandem, Timed 25 foot walk 4.61 seconds.  Romberg negative.  IMPRESSION: Relapsing-remitting multiple sclerosis  PLAN: 1.  Rebif 2.  Repeat MRI of brain with and without contrast, CBC with Diff, hepatic panel and vitamin D level in 6 months. 3.  Follow up in 6 months soon after repeat testing.  26 minutes spent face to face with patient, over 50% spent discussing future testing and vitamin D compliance.  Metta Clines, DO  CC:  Scarlette Calico, MD

## 2016-10-28 ENCOUNTER — Other Ambulatory Visit: Payer: BLUE CROSS/BLUE SHIELD

## 2016-11-03 ENCOUNTER — Ambulatory Visit
Admission: RE | Admit: 2016-11-03 | Discharge: 2016-11-03 | Disposition: A | Discharge status: Home / Self Care | Payer: BLUE CROSS/BLUE SHIELD | Source: Ambulatory Visit | Attending: Neurology | Admitting: Neurology

## 2016-11-03 DIAGNOSIS — G35 Multiple sclerosis: Secondary | ICD-10-CM

## 2016-11-03 MED ORDER — GADOBENATE DIMEGLUMINE 529 MG/ML IV SOLN
20.0000 mL | Freq: Once | INTRAVENOUS | Status: AC | PRN
  Administered 2016-11-03: 20 mL via INTRAVENOUS

## 2016-11-04 ENCOUNTER — Telehealth: Payer: Self-pay

## 2016-11-04 NOTE — Telephone Encounter (Signed)
Spoke to patient. Gave MRI results. Patient verbalized understanding.

## 2016-11-04 NOTE — Telephone Encounter (Signed)
Called patient. No answer. Will try later.  

## 2016-11-04 NOTE — Telephone Encounter (Signed)
-----   Message from Pieter Partridge, DO sent at 11/04/2016  7:02 AM EDT ----- MRI shows no new findings.  It looks stable compared to prior MRI from 2016.

## 2016-11-06 ENCOUNTER — Other Ambulatory Visit: Payer: Self-pay | Admitting: Neurology

## 2017-01-27 ENCOUNTER — Emergency Department (HOSPITAL_BASED_OUTPATIENT_CLINIC_OR_DEPARTMENT_OTHER): Payer: BLUE CROSS/BLUE SHIELD

## 2017-01-27 ENCOUNTER — Encounter (HOSPITAL_BASED_OUTPATIENT_CLINIC_OR_DEPARTMENT_OTHER): Payer: Self-pay | Admitting: *Deleted

## 2017-01-27 ENCOUNTER — Emergency Department (HOSPITAL_BASED_OUTPATIENT_CLINIC_OR_DEPARTMENT_OTHER)
Admission: EM | Admit: 2017-01-27 | Discharge: 2017-01-27 | Disposition: A | Discharge status: Home / Self Care | Payer: BLUE CROSS/BLUE SHIELD | Attending: Emergency Medicine | Admitting: Emergency Medicine

## 2017-01-27 DIAGNOSIS — Z79899 Other long term (current) drug therapy: Secondary | ICD-10-CM | POA: Diagnosis not present

## 2017-01-27 DIAGNOSIS — G35 Multiple sclerosis: Secondary | ICD-10-CM | POA: Diagnosis not present

## 2017-01-27 DIAGNOSIS — M25511 Pain in right shoulder: Secondary | ICD-10-CM | POA: Insufficient documentation

## 2017-01-27 MED ORDER — ACETAMINOPHEN 500 MG PO TABS
1000.0000 mg | ORAL_TABLET | Freq: Once | ORAL | Status: AC
  Administered 2017-01-27: 1000 mg via ORAL
  Filled 2017-01-27: qty 2

## 2017-01-27 NOTE — ED Notes (Signed)
ED Provider at bedside. 

## 2017-01-27 NOTE — Discharge Instructions (Signed)
Your shoulder x-rays were negative today for any fractures, dislocations or any other bony abnormalities. I suspect your pain is from repetitive, overuse type of activities that are causing inflammation to the soft tissues of your shoulder.  Over the next 3-5 days take 1000 mg of Tylenol every 8 hours, rest, avoid any range of motion of your right arm above head, ice.  Follow-up with primary care provider in a week if your symptoms worsen or persist.

## 2017-01-27 NOTE — ED Provider Notes (Signed)
[place] DEPT [facility] Provider Note   CSN: [id number] Arrival date & time: 01/27/17  1409   By signing my name below, I, Devon Barron, attest that this documentation has been prepared under the direction and in the presence of Carmon Sails, PA-C Electronically Signed: Soijett Barron, ED Scribe. 01/27/17. 5:14 PM.   History   Chief Complaint Chief Complaint  Patient presents with  . Shoulder Pain    HPI Devon Barron is a 56 y.o. male with a PMHx of MS, who presents to the Emergency Department complaining of intermittent right dull shoulder pain onset 1 month ago. Pt has tried tylenol with mild relief of his symptoms. Pt right shoulder pain is exacerbated with motion especially when moving right arm above head level, worse in the morning, and gradually alleviates throughout the day. He states that he initially thought that his symptoms were due to sleeping on his right side on an older mattress. 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 Denies recent MS flares. He denies numbness, tingling, HA, imbalance issues, prior right shoulder injury, recent fall, recent trauma, and any other symptoms.   The history is provided by the patient. No language interpreter was used.    Past Medical History:  Diagnosis Date  . MS (multiple sclerosis) [facility])     Patient Active Problem List   Diagnosis Date Noted  . Need for hepatitis C screening test 12/28/2015  . GERD (gastroesophageal reflux disease) 09/17/2013  . Cervical radiculitis 09/17/2013  . Other abnormal glucose 09/03/2013  . Routine general medical examination at a health care facility 09/03/2013  . Nonspecific abnormal electrocardiogram (ECG) (EKG) 09/03/2013  . Relapsing remitting multiple sclerosis ([place]) 07/28/2013    Past Surgical History:  Procedure Laterality Date  . HERNIA REPAIR         Home Medications    Prior to Admission medications     Medication Sig Start Date End Date Taking? Authorizing Provider  cholecalciferol (VITAMIN D) 1000 UNITS tablet Take 1,000 Units by mouth daily.    [provider]  clobetasol cream (TEMOVATE) 0.05 % apply to affected area twice a day 02/21/15   [provider]  LEVITRA 20 MG tablet  07/09/13   [provider]  REBIF 44 MCG/0.5ML SOSY injection INJECT ONE SYRINGE (44 MCG) SUBCUTANEOUSLY THREE TIMES PER WEEK. KEEPREFRIGERATED. 11/06/16   Pieter Partridge, DO  tadalafil (CIALIS) 5 MG tablet Take 1 tablet one hour prior to sexual activity. 02/20/15   [provider]    Family History Family History  Problem Relation Age of Onset  . Hypertension Mother   . Hypertension Father   . Diabetes Sister   . Diabetes Brother   . Ataxia Neg Hx   . Chorea Neg Hx   . Dementia Neg Hx   . Mental retardation Neg Hx   . Migraines Neg Hx   . Multiple sclerosis Neg Hx   . Neurofibromatosis Neg Hx   . Neuropathy Neg Hx   . Parkinsonism Neg Hx   . Seizures Neg Hx   . Stroke Neg Hx   . Early death Neg Hx   . Cancer Neg Hx   . Alcohol abuse Neg Hx   . Hearing loss Neg Hx   . Heart disease Neg Hx   . Hyperlipidemia Neg Hx   . Kidney disease Neg Hx     Social History Social History  Substance Use Topics  .  Smoking status: Never Smoker  . Smokeless tobacco: Never Used  . Alcohol use 3.0 oz/week    5 Cans of beer per week     Comment: Occ     Allergies   Patient has no known allergies.   Review of Systems Review of Systems  Constitutional: Negative for chills and fever.  HENT: Negative for congestion and sore throat.   Respiratory: Negative for cough and shortness of breath.   Cardiovascular: Negative for chest pain.  Gastrointestinal: Negative for abdominal pain, constipation, diarrhea, nausea and vomiting.  Genitourinary: Negative for difficulty urinating.  Musculoskeletal: Positive for arthralgias (right shoulder pain).  Neurological: Negative for numbness  and headaches.       No tingling    Physical Exam Updated Vital Signs BP (!) 145/90   Pulse 89   Temp 98.7 F (37.1 C) (Oral)   Resp 18   Ht 5\' 9"  (1.753 m)   Wt 192 lb (87.1 kg)   SpO2 100%   BMI 28.35 kg/m   Physical Exam  Constitutional: He is oriented to person, place, and time. He appears well-developed and well-nourished. No distress.  NAD.  HENT:  Head: Normocephalic and atraumatic.  Right Ear: External ear normal.  Left Ear: External ear normal.  Nose: Nose normal.  Eyes: Conjunctivae and EOM are normal. No scleral icterus.  Neck: Normal range of motion. Neck supple.  Cardiovascular: Normal rate, regular rhythm, normal heart sounds and intact distal pulses.   No murmur heard. Pulses:      Radial pulses are 2+ on the right side, and 2+ on the left side.  Pulmonary/Chest: Effort normal and breath sounds normal. He has no wheezes.  Musculoskeletal: Normal range of motion. He exhibits no deformity.       Right shoulder: He exhibits tenderness.  +Tenderness to right AC joint and biceps tendon +Positive Neer's test, Hawkin's and Adson's test.  +Pain with lifting R arm over head No obvious skin abnormalities including abrasions, ecchymosis, erythema, edema No point tenderness to sternum, anterior chest wall, scapula, clavicle, or [place] joints No tenderness to trapezius, deltoid Full active ROM of shoulder Negative sulcus sign Negative drop arm test, liftoff test   Neurological: He is alert and oriented to person, place, and time.  RUE with sensation to light touch intact in the radial, medial, and ulnar nerve distribution. Extremities NVI.   Skin: Skin is warm and dry. Capillary refill takes less than 2 seconds.  Psychiatric: He has a normal mood and affect. His behavior is normal. Judgment and thought content normal.  Nursing note and vitals reviewed.    ED Treatments / Results  DIAGNOSTIC STUDIES: Oxygen Saturation is 100% on RA, nl by my interpretation.     COORDINATION OF CARE: 5:01 PM Discussed treatment plan with pt at bedside which includes right shoulder xray, anti-inflammatory, and pt agreed to plan.   Radiology Dg Shoulder Right  Result Date: 01/27/2017 CLINICAL DATA:  One month history of right shoulder pain and tenderness. No known injuries. Current history of multiple sclerosis. EXAM: RIGHT SHOULDER - 2+ VIEW COMPARISON:  None. FINDINGS: No evidence of acute or subacute fracture or dislocation. Glenohumeral joint intact with well-preserved joint space. Subacromial space well-preserved. Acromioclavicular joint intact. Well-preserved bone mineral density. Benign appearing lucency involving the humeral head near the greater tuberosity. IMPRESSION: No significant abnormalities. Electronically Signed   By: Evangeline Dakin M.D.   On: 01/27/2017 14:45    Procedures Procedures (including critical care time)  Medications Ordered in ED  Medications  acetaminophen (TYLENOL) tablet 1,000 mg (1,000 mg Oral Given 01/27/17 1729)     Initial Impression / Assessment and Plan / ED Course  I have reviewed the triage vital signs and the nursing notes.  Pertinent imaging results that were available during my care of the patient were reviewed by me and considered in my medical decision making (see chart for details).    Patient X-Ray negative for obvious fracture or dislocation. Suspect soft tissue etiology.  Pt advised to follow up with PCP for persistent or worsening symptoms. Conservative therapy recommended and discussed with patient. Patient will be discharged home & is agreeable with above plan. Returns precautions discussed. Pt appears safe for discharge.  Final Clinical Impressions(s) / ED Diagnoses   Final diagnoses:  Acute pain of right shoulder    New Prescriptions New Prescriptions   No medications on file   I personally performed the services described in this documentation, which was scribed in my presence. The recorded  information has been reviewed and is accurate.     Kinnie Feil, PA-C 01/27/17 1736    Orlie Dakin, MD 01/28/17 [phone]

## 2017-01-27 NOTE — ED Triage Notes (Signed)
Pt c/o right shoulder pain with movt  x 1  month

## 2017-03-06 ENCOUNTER — Ambulatory Visit: Payer: BLUE CROSS/BLUE SHIELD | Admitting: Neurology

## 2017-03-24 ENCOUNTER — Encounter: Payer: Self-pay | Admitting: Neurology

## 2017-05-19 ENCOUNTER — Encounter: Payer: Self-pay | Admitting: Neurology

## 2017-05-19 ENCOUNTER — Ambulatory Visit (INDEPENDENT_AMBULATORY_CARE_PROVIDER_SITE_OTHER): Payer: BLUE CROSS/BLUE SHIELD | Admitting: Neurology

## 2017-05-19 ENCOUNTER — Other Ambulatory Visit: Payer: BLUE CROSS/BLUE SHIELD

## 2017-05-19 VITALS — BP 136/78 | HR 70 | Ht 69.0 in | Wt 194.8 lb

## 2017-05-19 DIAGNOSIS — G35 Multiple sclerosis: Secondary | ICD-10-CM | POA: Diagnosis not present

## 2017-05-19 NOTE — Patient Instructions (Signed)
1.  Check CBC with diff and LFTs today and again in 6 months (along with a vitamin D level in 6 months) 2.  Follow up in 6 months.

## 2017-05-19 NOTE — Progress Notes (Signed)
NEUROLOGY FOLLOW UP OFFICE NOTE  Devon Barron [id number]  HISTORY OF PRESENT ILLNESS: Devon Barron is a 56 year old right-handed man who follows up for multiple sclerosis.  UPDATE: He is taking Rebif.   He ran out of vitamin D again. In August, he was on a cruise and started to feel nauseous and dizzy.  The feeling lasted a couple of weeks.  He thinks it may have been the heat.  He is fine now. MRI of brain with and without contrast from 11/03/16 was personally reviewed and was stable compared to prior MRIs from 02/14/15, 03/08/14 and 07/13/13.   HISTORY: He was diagnosed with MS in 2001.  At that time, he presented with left facial myokymia. He reports that he hasn't had many flare ups, and only a couple of flare-ups requiring pulse steroids.  They typically present as blurred vision, headache, dizziness, unsteadiness and fatigue.  He has both supratentorial and infratentorial demyelinating lesions.  His cervical cord is okay.  He has a history of lapses with physician follow-ups and non-adherence to disease-modifying agents, mostly due to financial reasons.  He previously was on Avonex, and then Rebif.  Due to financial reasons, he has not followed up with a neurologist in between 2013 and 2014 and had not taken his Rebif.  He developed increased fatigue, stiffness, blurred vision and intermittent headaches, similar to prior flare ups.  He presented to the ED on 05/03/13.  Physical exam revealed slight right sided facial droop.  Initially, a CT of the head was performed and revealed decreased attenuation at the right subfrontal-anterior temporal junction.  MRI Brain w/wo contrast was performed, and revealed extensive periventricular and subcortical T2 hyperintensities, as well as focal restricted diffusion within the splenium of the corpus callosum, concerning for active demyelination.  He has since restarted Rebif.   Chronic symptoms include mild fatigue, but if he gets 10 hours of sleep, he feels  good.  He notes occasional urinary frequency but noting too concerning.  Transient symptoms include weakness, blurred vision and headache.   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  PAST MEDICAL HISTORY: Past Medical History:  Diagnosis Date  . MS (multiple sclerosis) ([place])     MEDICATIONS: Current Outpatient Prescriptions on File Prior to Visit  Medication Sig Dispense Refill  . cholecalciferol (VITAMIN D) 1000 UNITS tablet Take 1,000 Units by mouth daily.    . clobetasol cream (TEMOVATE) 0.05 % apply to affected area twice a day  0  . LEVITRA 20 MG tablet     . REBIF 44 MCG/0.5ML SOSY injection INJECT ONE SYRINGE (44 MCG) SUBCUTANEOUSLY THREE TIMES PER WEEK. KEEPREFRIGERATED. 36 Syringe 3  . tadalafil (CIALIS) 5 MG tablet Take 1 tablet one hour prior to sexual activity.     No current facility-administered medications on file prior to visit.     ALLERGIES: No Known Allergies  FAMILY HISTORY: Family History  Problem Relation Age of Onset  . Hypertension Mother   . Hypertension Father   . Diabetes Sister   . Diabetes Brother   . Ataxia Neg Hx   . Chorea Neg Hx   . Dementia Neg Hx   . Mental retardation Neg Hx   . Migraines Neg Hx   . Multiple sclerosis Neg Hx   . Neurofibromatosis Neg Hx   . Neuropathy Neg Hx   . Parkinsonism Neg Hx   . Seizures Neg Hx   . Stroke Neg Hx   .  Early death Neg Hx   . Cancer Neg Hx   . Alcohol abuse Neg Hx   . Hearing loss Neg Hx   . Heart disease Neg Hx   . Hyperlipidemia Neg Hx   . Kidney disease Neg Hx     SOCIAL HISTORY: Social History   Social History  . Marital status: Single    Spouse name: N/A  . Number of children: N/A  . Years of education: N/A   Occupational History  . Not on file.   Social History Main Topics  . Smoking status: Never Smoker  . Smokeless tobacco: Never Used  . Alcohol use 3.0 oz/week    5 Cans of beer per week     Comment: Occ  . Drug use: No  . Sexual activity:  Yes    Partners: Female   Other Topics Concern  . Not on file   Social History Narrative  . No narrative on file    REVIEW OF SYSTEMS: Constitutional: No fevers, chills, or sweats, no generalized fatigue, change in appetite Eyes: No visual changes, double vision, eye pain Ear, nose and throat: No hearing loss, ear pain, nasal congestion, sore throat Cardiovascular: No chest pain, palpitations Respiratory:  No shortness of breath at rest or with exertion, wheezes GastrointestinaI: No nausea, vomiting, diarrhea, abdominal pain, fecal incontinence Genitourinary:  No dysuria, urinary retention or frequency Musculoskeletal:  No neck pain, back pain Integumentary: No rash, pruritus, skin lesions Neurological: as above Psychiatric: No depression, insomnia, anxiety Endocrine: No palpitations, fatigue, diaphoresis, mood swings, change in appetite, change in weight, increased thirst Hematologic/Lymphatic:  No purpura, petechiae. Allergic/Immunologic: no itchy/runny eyes, nasal congestion, recent allergic reactions, rashes  PHYSICAL EXAM: Vitals:   05/19/17 1112  BP: 136/78  Pulse: 70  SpO2: 97%   General: No acute distress.  Patient appears well-groomed. Head:  Normocephalic/atraumatic Eyes:  Fundi examined but not visualized Neck: supple, no paraspinal tenderness, full range of motion Heart:  Regular rate and rhythm Lungs:  Clear to auscultation bilaterally Back: No paraspinal tenderness Neurological Exam: alert and oriented to person, place, and time. Attention span and concentration intact, recent and remote memory intact, fund of knowledge intact.  Speech with stuttering but not dysarthric, language intact.  Mild right facial weakness, otherwise CN II-XII intact. Fundi not visualized.  Bulk and tone normal, muscle strength 5/5 throughout.  Sensation to light touch, temperature and vibration intact.  Deep tendon reflexes 2+ throughout, toes downgoing.  Finger to nose and heel to shin  testing intact.  Gait station and stride normal, able to walk in tandem.  Romberg negative.  IMPRESSION: Relapsing remitting MS, stable  PLAN: 1.  Continue Rebif.  Take vitamin D3 4000 IU daily. 2.  Recheck CBC with diff and LFTs today.  Repeat CBC with diff, LFTs and vitamin D level in 6 months. 3.  Follow up in 6 months after repeat labs.  25 minutes spent face to face with patient, over 50% spent discussing management.  Metta Clines, DO  CC:  Scarlette Calico, MD

## 2017-05-21 ENCOUNTER — Telehealth: Payer: Self-pay

## 2017-05-21 NOTE — Telephone Encounter (Signed)
Called Pt and advsd of lab results

## 2017-05-21 NOTE — Telephone Encounter (Signed)
-----   Message from Pieter Partridge, DO sent at 05/20/2017  8:45 AM EDT ----- Labs are ok

## 2017-05-23 LAB — HEPATIC FUNCTION PANEL
AG Ratio: 1.2 (calc) (ref 1.0–2.5)
ALKALINE PHOSPHATASE (APISO): 61 U/L (ref 40–115)
ALT: 19 U/L (ref 9–46)
AST: 17 U/L (ref 10–35)
Albumin: 4.2 g/dL (ref 3.6–5.1)
BILIRUBIN DIRECT: 0.1 mg/dL (ref 0.0–0.2)
Globulin: 3.6 g/dL (calc) (ref 1.9–3.7)
Indirect Bilirubin: 0.4 mg/dL (calc) (ref 0.2–1.2)
Total Bilirubin: 0.5 mg/dL (ref 0.2–1.2)
Total Protein: 7.8 g/dL (ref 6.1–8.1)

## 2017-05-23 LAB — CBC WITH DIFFERENTIAL/PLATELET
BASOS PCT: 0.9 %
Basophils Absolute: 41 cells/uL (ref 0–200)
EOS PCT: 4.3 %
Eosinophils Absolute: 198 cells/uL (ref 15–500)
HEMATOCRIT: 43.6 % (ref 38.5–50.0)
HEMOGLOBIN: 14.5 g/dL (ref 13.2–17.1)
LYMPHS ABS: 1021 {cells}/uL (ref 850–3900)
MCH: 28.2 pg (ref 27.0–33.0)
MCHC: 33.3 g/dL (ref 32.0–36.0)
MCV: 84.8 fL (ref 80.0–100.0)
MONOS PCT: 11.5 %
MPV: 11.5 fL (ref 7.5–12.5)
NEUTROS ABS: 2811 {cells}/uL (ref 1500–7800)
Neutrophils Relative %: 61.1 %
Platelets: 197 10*3/uL (ref 140–400)
RBC: 5.14 10*6/uL (ref 4.20–5.80)
RDW: 13.2 % (ref 11.0–15.0)
Total Lymphocyte: 22.2 %
WBC mixed population: 529 cells/uL (ref 200–950)
WBC: 4.6 10*3/uL (ref 3.8–10.8)

## 2017-05-23 LAB — VITAMIN D 1,25 DIHYDROXY
VITAMIN D 1, 25 (OH) TOTAL: 69 pg/mL (ref 18–72)
Vitamin D2 1, 25 (OH)2: <8 pg/mL
Vitamin D3 1, 25 (OH)2: 69 pg/mL

## 2017-11-05 ENCOUNTER — Other Ambulatory Visit: Payer: Self-pay | Admitting: Neurology

## 2017-11-24 ENCOUNTER — Ambulatory Visit: Payer: Self-pay | Admitting: Neurology

## 2017-11-30 ENCOUNTER — Encounter: Payer: Self-pay | Admitting: Neurology

## 2017-12-09 ENCOUNTER — Ambulatory Visit (INDEPENDENT_AMBULATORY_CARE_PROVIDER_SITE_OTHER): Payer: BLUE CROSS/BLUE SHIELD | Admitting: Neurology

## 2017-12-09 ENCOUNTER — Encounter: Payer: Self-pay | Admitting: Neurology

## 2017-12-09 ENCOUNTER — Other Ambulatory Visit: Payer: BLUE CROSS/BLUE SHIELD

## 2017-12-09 VITALS — BP 138/88 | HR 87 | Ht 69.0 in | Wt 205.0 lb

## 2017-12-09 DIAGNOSIS — G35 Multiple sclerosis: Secondary | ICD-10-CM

## 2017-12-09 DIAGNOSIS — E559 Vitamin D deficiency, unspecified: Secondary | ICD-10-CM | POA: Diagnosis not present

## 2017-12-09 DIAGNOSIS — Z79899 Other long term (current) drug therapy: Secondary | ICD-10-CM

## 2017-12-09 NOTE — Progress Notes (Signed)
NEUROLOGY FOLLOW UP OFFICE NOTE  Devon Barron [id number]  HISTORY OF PRESENT ILLNESS: Devon Barron is a 57 year old right-handed man who follows up for multiple sclerosis.   UPDATE: Current disease modifying therapy:  Rebif  Vision:  good Motor:  good Sensory:  good Pain:  no Gait:  normal Bowel/bladder:  infrequent increased urinary frequency Fatigue:  mild Cognition:  no Mood:  good  Current medication:  D3 4000 IU daily  05/19/17 LABS: CBC with WBC 4.6, HGB 14.5, HCT 43.6, PLT 197, ALC 1,021; hepatic panel with t bili 0.5, ALP 61, AST 17 and ALT 19; D level 69.  HISTORY: He was diagnosed with MS in 2001.  At that time, he presented with left facial myokymia. He reports that he hasn't had many flare ups, and only a couple of flare-ups requiring pulse steroids.  They typically present as blurred vision, headache, dizziness, unsteadiness and fatigue.  He has both supratentorial and infratentorial demyelinating lesions.  His cervical cord is okay.  He has a history of lapses with physician follow-ups and non-adherence to disease-modifying agents, mostly due to financial reasons.  He previously was on Avonex, and then Rebif.  Due to financial reasons, he has not followed up with a neurologist in between 2013 and 2014 and had not taken his Rebif.  He developed increased fatigue, stiffness, blurred vision and intermittent headaches, similar to prior flare ups.  He presented to the ED on 05/03/13.  Physical exam revealed slight right sided facial droop.  Initially, a CT of the head was performed and revealed decreased attenuation at the right subfrontal-anterior temporal junction.  MRI Brain w/wo contrast was performed, and revealed extensive periventricular and subcortical T2 hyperintensities, as well as focal restricted diffusion within the splenium of the corpus callosum, concerning for active demyelination.  He has since restarted Rebif.  Last MRI of brain with and without contrast  from 11/03/16 was stable compared to prior MRIs from 02/14/15, 03/08/14 and 07/13/13.   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  PAST MEDICAL HISTORY: Past Medical History:  Diagnosis Date  . MS (multiple sclerosis) ([place])     MEDICATIONS: Current Outpatient Medications on File Prior to Visit  Medication Sig Dispense Refill  . cholecalciferol (VITAMIN D) 1000 UNITS tablet Take 1,000 Units by mouth daily.    . clobetasol cream (TEMOVATE) 0.05 % apply to affected area twice a day  0  . LEVITRA 20 MG tablet     . REBIF 44 MCG/0.5ML SOSY injection INJECT ONE SYRINGE (44 MCG) SUBCUTANEOUSLY THREE TIMES PER WEEK. KEEPREFRIGERATED. 36 Syringe 2  . tadalafil (CIALIS) 5 MG tablet Take 1 tablet one hour prior to sexual activity.     No current facility-administered medications on file prior to visit.     ALLERGIES: No Known Allergies  FAMILY HISTORY: Family History  Problem Relation Age of Onset  . Hypertension Mother   . Hypertension Father   . Diabetes Sister   . Diabetes Brother   . Ataxia Neg Hx   . Chorea Neg Hx   . Dementia Neg Hx   . Mental retardation Neg Hx   . Migraines Neg Hx   . Multiple sclerosis Neg Hx   . Neurofibromatosis Neg Hx   . Neuropathy Neg Hx   . Parkinsonism Neg Hx   . Seizures Neg Hx   . Stroke Neg Hx   . Early death Neg Hx   . Cancer Neg Hx   .  Alcohol abuse Neg Hx   . Hearing loss Neg Hx   . Heart disease Neg Hx   . Hyperlipidemia Neg Hx   . Kidney disease Neg Hx     SOCIAL HISTORY: Social History   Socioeconomic History  . Marital status: Single    Spouse name: Not on file  . Number of children: Not on file  . Years of education: Not on file  . Highest education level: Not on file  Occupational History  . Not on file  Social Needs  . Financial resource strain: Not on file  . Food insecurity:    Worry: Not on file    Inability: Not on file  . Transportation needs:    Medical: Not on file    Non-medical: Not  on file  Tobacco Use  . Smoking status: Never Smoker  . Smokeless tobacco: Never Used  Substance and Sexual Activity  . Alcohol use: Yes    Alcohol/week: 3.0 oz    Types: 5 Cans of beer per week    Comment: Occ  . Drug use: No  . Sexual activity: Yes    Partners: Female  Lifestyle  . Physical activity:    Days per week: Not on file    Minutes per session: Not on file  . Stress: Not on file  Relationships  . Social connections:    Talks on phone: Not on file    Gets together: Not on file    Attends religious service: Not on file    Active member of club or organization: Not on file    Attends meetings of clubs or organizations: Not on file    Relationship status: Not on file  . Intimate partner violence:    Fear of current or ex partner: Not on file    Emotionally abused: Not on file    Physically abused: Not on file    Forced sexual activity: Not on file  Other Topics Concern  . Not on file  Social History Narrative  . Not on file    REVIEW OF SYSTEMS: Constitutional: No fevers, chills, or sweats, no generalized fatigue, change in appetite Eyes: No visual changes, double vision, eye pain Ear, nose and throat: No hearing loss, ear pain, nasal congestion, sore throat Cardiovascular: No chest pain, palpitations Respiratory:  No shortness of breath at rest or with exertion, wheezes GastrointestinaI: No nausea, vomiting, diarrhea, abdominal pain, fecal incontinence Genitourinary:  No dysuria, urinary retention or frequency Musculoskeletal:  No neck pain, back pain Integumentary: seborrheic dermatitis, hair loss Neurological: as above Psychiatric: No depression, insomnia, anxiety Endocrine: No palpitations, fatigue, diaphoresis, mood swings, change in appetite, change in weight, increased thirst Hematologic/Lymphatic:  No purpura, petechiae. Allergic/Immunologic: no itchy/runny eyes, nasal congestion, recent allergic reactions, rashes  PHYSICAL EXAM: Vitals:   12/09/17  1405  BP: 138/88  Pulse: 87  SpO2: 96%   General: No acute distress.  Patient appears well-groomed.  Head:  Normocephalic/atraumatic Eyes:  Fundi examined but not visualized Neck: supple, no paraspinal tenderness, full range of motion Heart:  Regular rate and rhythm Lungs:  Clear to auscultation bilaterally Back: No paraspinal tenderness Neurological Exam: alert and oriented to person, place, and time. Attention span and concentration intact, recent and remote memory intact, fund of knowledge intact.  Speech fluent and not dysarthric, language intact.  CN II-XII intact. Bulk and tone normal, muscle strength 5/5 throughout.  Sensation to light touch  intact.  Deep tendon reflexes 3+ left biceps, otherwise, 2+ throughout, toes  downgoing.  Finger to nose and heel to shin testing intact.  Gait normal.  Timed 25 foot walk 4.58 seconds.  Romberg negative.  IMPRESSION: Multiple sclerosis stable  PLAN: 1.  Continue Rebif and D3 4000 IU daily 2.  Check CBC with diff, CMP and D level today and again in 6 months. 3.  Follow up in 6 months.  26 minutes spent face to face with patient, over 50% spent discussing management.   Metta Clines, DO  CC:  Dr. Ronnald Ramp

## 2017-12-09 NOTE — Patient Instructions (Addendum)
1.  Continue Rebif and D3 4000 IU daily 2.  Check CBC with diff, CMP and D level today and again in 6 months. Your provider has requested that you have labwork completed today. Please go to [facility] ([id number]) on the second floor of this building before leaving the office today. You do not need to check in. If you are not called within 15 minutes please check with the front desk.  3.  Follow up in 6 months.

## 2017-12-11 LAB — CBC WITH DIFFERENTIAL/PLATELET
BASOS ABS: 39 {cells}/uL (ref 0–200)
Basophils Relative: 0.7 %
EOS ABS: 99 {cells}/uL (ref 15–500)
Eosinophils Relative: 1.8 %
HEMATOCRIT: 41.4 % (ref 38.5–50.0)
HEMOGLOBIN: 14.1 g/dL (ref 13.2–17.1)
LYMPHS ABS: 1342 {cells}/uL (ref 850–3900)
MCH: 28.5 pg (ref 27.0–33.0)
MCHC: 34.1 g/dL (ref 32.0–36.0)
MCV: 83.8 fL (ref 80.0–100.0)
MPV: 11.5 fL (ref 7.5–12.5)
Monocytes Relative: 15 %
NEUTROS PCT: 58.1 %
Neutro Abs: 3196 cells/uL (ref 1500–7800)
Platelets: 210 10*3/uL (ref 140–400)
RBC: 4.94 10*6/uL (ref 4.20–5.80)
RDW: 13.3 % (ref 11.0–15.0)
Total Lymphocyte: 24.4 %
WBC: 5.5 10*3/uL (ref 3.8–10.8)
WBCMIX: 825 {cells}/uL (ref 200–950)

## 2017-12-11 LAB — COMPREHENSIVE METABOLIC PANEL
AG Ratio: 1.3 (calc) (ref 1.0–2.5)
ALT: 18 U/L (ref 9–46)
AST: 17 U/L (ref 10–35)
Albumin: 4.3 g/dL (ref 3.6–5.1)
Alkaline phosphatase (APISO): 54 U/L (ref 40–115)
BILIRUBIN TOTAL: 0.5 mg/dL (ref 0.2–1.2)
BUN: 13 mg/dL (ref 7–25)
CO2: 27 mmol/L (ref 20–32)
CREATININE: 1.05 mg/dL (ref 0.70–1.33)
Calcium: 9.4 mg/dL (ref 8.6–10.3)
Chloride: 103 mmol/L (ref 98–110)
Globulin: 3.2 g/dL (calc) (ref 1.9–3.7)
Glucose, Bld: 99 mg/dL (ref 65–99)
POTASSIUM: 3.8 mmol/L (ref 3.5–5.3)
SODIUM: 139 mmol/L (ref 135–146)
Total Protein: 7.5 g/dL (ref 6.1–8.1)

## 2017-12-11 LAB — VITAMIN D 1,25 DIHYDROXY
Vitamin D 1, 25 (OH)2 Total: 77 pg/mL — ABNORMAL HIGH (ref 18–72)
Vitamin D2 1, 25 (OH)2: <8 pg/mL
Vitamin D3 1, 25 (OH)2: 77 pg/mL

## 2017-12-12 ENCOUNTER — Telehealth: Payer: Self-pay

## 2017-12-12 NOTE — Telephone Encounter (Signed)
Called and spoke with Pt, advsd of lab results.

## 2017-12-12 NOTE — Telephone Encounter (Signed)
-----   Message from Pieter Partridge, DO sent at 12/12/2017  2:31 PM EDT ----- Labs look good.  Continue current vitamin D dose.

## 2017-12-22 ENCOUNTER — Encounter: Payer: Self-pay | Admitting: Neurology

## 2018-03-17 LAB — PSA: PSA: 5

## 2018-03-24 DIAGNOSIS — N509 Disorder of male genital organs, unspecified: Secondary | ICD-10-CM | POA: Insufficient documentation

## 2018-03-24 DIAGNOSIS — E291 Testicular hypofunction: Secondary | ICD-10-CM | POA: Insufficient documentation

## 2018-03-24 DIAGNOSIS — Z8042 Family history of malignant neoplasm of prostate: Secondary | ICD-10-CM | POA: Insufficient documentation

## 2018-03-24 DIAGNOSIS — R972 Elevated prostate specific antigen [PSA]: Secondary | ICD-10-CM | POA: Insufficient documentation

## 2018-03-25 ENCOUNTER — Encounter: Payer: Self-pay | Admitting: Internal Medicine

## 2018-03-26 DIAGNOSIS — Z0279 Encounter for issue of other medical certificate: Secondary | ICD-10-CM

## 2018-03-31 ENCOUNTER — Encounter: Payer: Self-pay | Admitting: Internal Medicine

## 2018-03-31 ENCOUNTER — Other Ambulatory Visit (INDEPENDENT_AMBULATORY_CARE_PROVIDER_SITE_OTHER): Payer: BLUE CROSS/BLUE SHIELD

## 2018-03-31 ENCOUNTER — Ambulatory Visit (INDEPENDENT_AMBULATORY_CARE_PROVIDER_SITE_OTHER): Payer: BLUE CROSS/BLUE SHIELD | Admitting: Internal Medicine

## 2018-03-31 VITALS — BP 130/80 | HR 79 | Temp 98.2°F | Ht 69.0 in | Wt 194.0 lb

## 2018-03-31 DIAGNOSIS — E1165 Type 2 diabetes mellitus with hyperglycemia: Secondary | ICD-10-CM

## 2018-03-31 DIAGNOSIS — R7303 Prediabetes: Secondary | ICD-10-CM | POA: Insufficient documentation

## 2018-03-31 DIAGNOSIS — I10 Essential (primary) hypertension: Secondary | ICD-10-CM

## 2018-03-31 DIAGNOSIS — Z23 Encounter for immunization: Secondary | ICD-10-CM | POA: Diagnosis not present

## 2018-03-31 DIAGNOSIS — E785 Hyperlipidemia, unspecified: Secondary | ICD-10-CM

## 2018-03-31 DIAGNOSIS — Z1211 Encounter for screening for malignant neoplasm of colon: Secondary | ICD-10-CM

## 2018-03-31 DIAGNOSIS — Z1159 Encounter for screening for other viral diseases: Secondary | ICD-10-CM

## 2018-03-31 DIAGNOSIS — E118 Type 2 diabetes mellitus with unspecified complications: Secondary | ICD-10-CM

## 2018-03-31 DIAGNOSIS — Z0000 Encounter for general adult medical examination without abnormal findings: Secondary | ICD-10-CM | POA: Diagnosis not present

## 2018-03-31 DIAGNOSIS — IMO0002 Reserved for concepts with insufficient information to code with codable children: Secondary | ICD-10-CM

## 2018-03-31 DIAGNOSIS — Z1212 Encounter for screening for malignant neoplasm of rectum: Secondary | ICD-10-CM

## 2018-03-31 LAB — URINALYSIS, ROUTINE W REFLEX MICROSCOPIC
BILIRUBIN URINE: NEGATIVE
Hgb urine dipstick: NEGATIVE
Ketones, ur: NEGATIVE
Leukocytes, UA: NEGATIVE
Nitrite: NEGATIVE
PH: 6 (ref 5.0–8.0)
RBC / HPF: NONE SEEN (ref 0–?)
SPECIFIC GRAVITY, URINE: 1.02 (ref 1.000–1.030)
TOTAL PROTEIN, URINE-UPE24: NEGATIVE
UROBILINOGEN UA: 0.2 (ref 0.0–1.0)
Urine Glucose: NEGATIVE

## 2018-03-31 LAB — POCT GLYCOSYLATED HEMOGLOBIN (HGB A1C): HEMOGLOBIN A1C: 5.6 % (ref 4.0–5.6)

## 2018-03-31 LAB — POCT GLUCOSE (DEVICE FOR HOME USE): GLUCOSE FASTING, POC: 102 mg/dL — AB (ref 70–99)

## 2018-03-31 LAB — MICROALBUMIN / CREATININE URINE RATIO
CREATININE, U: 256.3 mg/dL
MICROALB UR: 2.4 mg/dL — AB (ref 0.0–1.9)
Microalb Creat Ratio: 0.9 mg/g (ref 0.0–30.0)

## 2018-03-31 LAB — CBC WITH DIFFERENTIAL/PLATELET
BASOS ABS: 0.1 10*3/uL (ref 0.0–0.1)
Basophils Relative: 1 % (ref 0.0–3.0)
EOS ABS: 0.1 10*3/uL (ref 0.0–0.7)
Eosinophils Relative: 1.1 % (ref 0.0–5.0)
HCT: 43.5 % (ref 39.0–52.0)
Hemoglobin: 14.2 g/dL (ref 13.0–17.0)
LYMPHS ABS: 1 10*3/uL (ref 0.7–4.0)
Lymphocytes Relative: 17.1 % (ref 12.0–46.0)
MCHC: 32.6 g/dL (ref 30.0–36.0)
MCV: 86.8 fl (ref 78.0–100.0)
MONO ABS: 0.9 10*3/uL (ref 0.1–1.0)
Monocytes Relative: 15.2 % — ABNORMAL HIGH (ref 3.0–12.0)
NEUTROS ABS: 3.8 10*3/uL (ref 1.4–7.7)
Neutrophils Relative %: 65.6 % (ref 43.0–77.0)
PLATELETS: 273 10*3/uL (ref 150.0–400.0)
RBC: 5.02 Mil/uL (ref 4.22–5.81)
RDW: 14.3 % (ref 11.5–15.5)
WBC: 5.8 10*3/uL (ref 4.0–10.5)

## 2018-03-31 LAB — COMPREHENSIVE METABOLIC PANEL
ALBUMIN: 4.4 g/dL (ref 3.5–5.2)
ALK PHOS: 51 U/L (ref 39–117)
ALT: 20 U/L (ref 0–53)
AST: 17 U/L (ref 0–37)
BILIRUBIN TOTAL: 0.7 mg/dL (ref 0.2–1.2)
BUN: 8 mg/dL (ref 6–23)
CALCIUM: 9.9 mg/dL (ref 8.4–10.5)
CO2: 30 mEq/L (ref 19–32)
CREATININE: 1 mg/dL (ref 0.40–1.50)
Chloride: 103 mEq/L (ref 96–112)
GFR: 99.03 mL/min (ref >60.00)
Glucose, Bld: 99 mg/dL (ref 70–99)
Potassium: 3.7 mEq/L (ref 3.5–5.1)
Sodium: 138 mEq/L (ref 135–145)
Total Protein: 8.1 g/dL (ref 6.0–8.3)

## 2018-03-31 LAB — LIPID PANEL
CHOL/HDL RATIO: 4
CHOLESTEROL: 173 mg/dL (ref 0–200)
HDL: 48.5 mg/dL (ref >39.00)
LDL Cholesterol: 108 mg/dL — ABNORMAL HIGH (ref 0–99)
NonHDL: 124.53
TRIGLYCERIDES: 81 mg/dL (ref 0.0–149.0)
VLDL: 16.2 mg/dL (ref 0.0–40.0)

## 2018-03-31 LAB — TSH: TSH: 1.31 u[IU]/mL (ref 0.35–4.50)

## 2018-03-31 NOTE — Progress Notes (Signed)
Subjective:  Patient ID: Devon Barron, male    DOB: [birth year]  Age: 57 y.o. MRN: [id number]  CC: Diabetes and Annual Exam   HPI KAHLIL COWANS presents for a CPX and f/up - He tells me that he saw a urologist about a week or 2 ago complaining of erectile dysfunction and was told that there was glucose in his urine and that his PSA was 5.  The patient has not check his blood sugar.  He feels well today and offers no complaints other than the erectile dysfunction.  He is going on a cruise in 3 weeks and is concerned about exposure to measles.  He wants to have his measles titer checked today to see if he needs a booster.  Outpatient Medications Prior to Visit  Medication Sig Dispense Refill  . cholecalciferol (VITAMIN D) 1000 UNITS tablet Take 1,000 Units by mouth daily.    Marland Kitchen LEVITRA 20 MG tablet     . REBIF 44 MCG/0.5ML SOSY injection INJECT ONE SYRINGE (44 MCG) SUBCUTANEOUSLY THREE TIMES PER WEEK. KEEPREFRIGERATED. 36 Syringe 2  . tadalafil (CIALIS) 5 MG tablet Take 1 tablet one hour prior to sexual activity.    . clobetasol cream (TEMOVATE) 0.05 % apply to affected area twice a day  0   No facility-administered medications prior to visit.     ROS Review of Systems  Constitutional: Negative for diaphoresis and unexpected weight change.  HENT: Negative.   Eyes: Negative for visual disturbance.  Respiratory: Negative for cough, chest tightness, shortness of breath and wheezing.   Cardiovascular: Negative for chest pain, palpitations and leg swelling.  Gastrointestinal: Negative for abdominal pain, constipation, diarrhea, nausea and vomiting.  Endocrine: Negative.  Negative for polydipsia, polyphagia and polyuria.  Genitourinary: Negative.  Negative for difficulty urinating.  Musculoskeletal: Negative.  Negative for arthralgias and myalgias.  Skin: Negative.   Neurological: Negative.  Negative for dizziness, weakness and light-headedness.  Hematological: Negative for adenopathy.  Does not bruise/bleed easily.  Psychiatric/Behavioral: Negative.     Objective:  BP 130/80 (BP Location: Left Arm, Patient Position: Sitting, Cuff Size: Normal)   Pulse 79   Temp 98.2 F (36.8 C) (Oral)   Ht 5\' 9"  (1.753 m)   Wt 194 lb (88 kg)   SpO2 97%   BMI 28.65 kg/m   BP Readings from Last 3 Encounters:  03/31/18 130/80  12/09/17 138/88  05/19/17 136/78    Wt Readings from Last 3 Encounters:  03/31/18 194 lb (88 kg)  12/09/17 205 lb (93 kg)  05/19/17 194 lb 12.8 oz (88.4 kg)    Physical Exam  Constitutional: He is oriented to person, place, and time. No distress.  HENT:  Mouth/Throat: Oropharynx is clear and moist. No oropharyngeal exudate.  Eyes: Conjunctivae are normal. No scleral icterus.  Neck: Normal range of motion. No JVD present. No thyromegaly present.  Cardiovascular: Normal rate, regular rhythm and normal heart sounds. Exam reveals no gallop and no friction rub.  No murmur heard. Pulmonary/Chest: Effort normal and breath sounds normal. He has no wheezes. He has no rhonchi. He has no rales.  Abdominal: Soft. Bowel sounds are normal. He exhibits no mass. There is no hepatosplenomegaly. There is no tenderness.  Genitourinary:  Genitourinary Comments: GU and rectal exams were deferred at his request as he tells me this was recently done by a urologist.  Musculoskeletal: Normal range of motion. He exhibits no edema, tenderness or deformity.  Lymphadenopathy:    He has no cervical adenopathy.  Neurological: He is alert and oriented to person, place, and time.  Skin: Skin is warm and dry. No rash noted. He is not diaphoretic. No erythema.  Psychiatric: He has a normal mood and affect. His behavior is normal. Judgment and thought content normal.  Vitals reviewed.   Lab Results  Component Value Date   WBC 5.8 03/31/2018   HGB 14.2 03/31/2018   HCT 43.5 03/31/2018   PLT 273.0 03/31/2018   GLUCOSE 99 03/31/2018   CHOL 173 03/31/2018   TRIG 81.0 03/31/2018     HDL 48.50 03/31/2018   LDLDIRECT 130.0 12/28/2015   LDLCALC 108 (H) 03/31/2018   ALT 20 03/31/2018   AST 17 03/31/2018   NA 138 03/31/2018   K 3.7 03/31/2018   CL 103 03/31/2018   CREATININE 1.00 03/31/2018   BUN 8 03/31/2018   CO2 30 03/31/2018   TSH 1.31 03/31/2018   PSA 5.0 03/17/2018   HGBA1C 5.6 03/31/2018   MICROALBUR 2.4 (H) 03/31/2018    Dg Shoulder Right  Result Date: 01/27/2017 CLINICAL DATA:  One month history of right shoulder pain and tenderness. No known injuries. Current history of multiple sclerosis. EXAM: RIGHT SHOULDER - 2+ VIEW COMPARISON:  None. FINDINGS: No evidence of acute or subacute fracture or dislocation. Glenohumeral joint intact with well-preserved joint space. Subacromial space well-preserved. Acromioclavicular joint intact. Well-preserved bone mineral density. Benign appearing lucency involving the humeral head near the greater tuberosity. IMPRESSION: No significant abnormalities. Electronically Signed   By: Evangeline Dakin M.D.   On: 01/27/2017 14:45    Assessment & Plan:   Toussaint was seen today for diabetes and annual exam.  Diagnoses and all orders for this visit:  Need for influenza vaccination -     Flu Vaccine QUAD 36+ mos IM  Type II diabetes mellitus with manifestations, uncontrolled ([place])- He is not diabetic.  He has mild prediabetes.  There is no glucose in the urine today. -     HM DIABETES FOOT EXAM -     Microalbumin / creatinine urine ratio; Future -     Comprehensive metabolic panel; Future -     Cancel: Amb Referral to Nutrition and Diabetic E -     Cancel: Ambulatory referral to Ophthalmology -     POCT glycosylated hemoglobin (Hb A1C) -     POCT Glucose (Device for Home Use)  Essential hypertension- His blood pressure is well controlled.  Labs are negative for secondary causes or endorgan damage.  Medical therapy is not indicated. -     CBC with Differential/Platelet; Future -     TSH; Future -     Comprehensive metabolic  panel; Future -     Urinalysis, Routine w reflex microscopic; Future  Hyperlipidemia LDL goal <100- He does not have an elevated ASCVD risk score so I do not recommend a statin for CV risk reduction at this time. -     Lipid panel; Future -     TSH; Future -     Comprehensive metabolic panel; Future  Measles screening -     Rubeola antibody IgG; Future  Colon cancer screening -     Cologuard  Screening for malignant neoplasm of the rectum -     Cologuard  Prediabetes- See above  Routine general medical examination at a health care facility- Exam completed, labs reviewed, vaccines reviewed and updated, Cologuard ordered to screen for colon cancer/polyps, patient education material was given.   I have discontinued Dominik B. Steuber's clobetasol cream.  I am also having him maintain his cholecalciferol, LEVITRA, tadalafil, and REBIF.  No orders of the defined types were placed in this encounter.    Follow-up: Return in about 3 months (around 07/01/2018).  Scarlette Calico, MD

## 2018-03-31 NOTE — Patient Instructions (Addendum)

## 2018-04-01 ENCOUNTER — Encounter: Payer: Self-pay | Admitting: Internal Medicine

## 2018-04-01 LAB — RUBEOLA ANTIBODY IGG

## 2018-04-01 LAB — COLOGUARD: Cologuard: NEGATIVE

## 2018-04-14 ENCOUNTER — Encounter: Payer: Self-pay | Admitting: Internal Medicine

## 2018-05-26 ENCOUNTER — Encounter: Payer: Self-pay | Admitting: Internal Medicine

## 2018-05-27 ENCOUNTER — Encounter: Payer: Self-pay | Admitting: Internal Medicine

## 2018-06-02 ENCOUNTER — Encounter: Payer: Self-pay | Admitting: Internal Medicine

## 2018-06-11 ENCOUNTER — Encounter: Payer: Self-pay | Admitting: Neurology

## 2018-06-11 ENCOUNTER — Ambulatory Visit: Payer: BLUE CROSS/BLUE SHIELD | Admitting: Neurology

## 2018-06-11 ENCOUNTER — Other Ambulatory Visit (INDEPENDENT_AMBULATORY_CARE_PROVIDER_SITE_OTHER): Payer: BLUE CROSS/BLUE SHIELD

## 2018-06-11 VITALS — BP 144/78 | HR 97 | Ht 69.0 in | Wt 200.0 lb

## 2018-06-11 DIAGNOSIS — E559 Vitamin D deficiency, unspecified: Secondary | ICD-10-CM

## 2018-06-11 DIAGNOSIS — Z79899 Other long term (current) drug therapy: Secondary | ICD-10-CM

## 2018-06-11 DIAGNOSIS — G35 Multiple sclerosis: Secondary | ICD-10-CM

## 2018-06-11 LAB — COMPREHENSIVE METABOLIC PANEL
ALBUMIN: 4.5 g/dL (ref 3.5–5.2)
ALT: 23 U/L (ref 0–53)
AST: 20 U/L (ref 0–37)
Alkaline Phosphatase: 55 U/L (ref 39–117)
BUN: 10 mg/dL (ref 6–23)
CHLORIDE: 101 meq/L (ref 96–112)
CO2: 32 mEq/L (ref 19–32)
Calcium: 9.6 mg/dL (ref 8.4–10.5)
Creatinine, Ser: 0.97 mg/dL (ref 0.40–1.50)
GFR: 102.5 mL/min (ref >60.00)
GLUCOSE: 137 mg/dL — AB (ref 70–99)
POTASSIUM: 3.6 meq/L (ref 3.5–5.1)
Sodium: 138 mEq/L (ref 135–145)
Total Bilirubin: 0.6 mg/dL (ref 0.2–1.2)
Total Protein: 8 g/dL (ref 6.0–8.3)

## 2018-06-11 LAB — CBC WITH DIFFERENTIAL/PLATELET
BASOS PCT: 0.6 % (ref 0.0–3.0)
Basophils Absolute: 0 10*3/uL (ref 0.0–0.1)
EOS PCT: 2.1 % (ref 0.0–5.0)
Eosinophils Absolute: 0.1 10*3/uL (ref 0.0–0.7)
HCT: 45.2 % (ref 39.0–52.0)
HEMOGLOBIN: 14.9 g/dL (ref 13.0–17.0)
Lymphocytes Relative: 25.1 % (ref 12.0–46.0)
Lymphs Abs: 1.3 10*3/uL (ref 0.7–4.0)
MCHC: 32.9 g/dL (ref 30.0–36.0)
MCV: 86.7 fl (ref 78.0–100.0)
MONO ABS: 0.6 10*3/uL (ref 0.1–1.0)
Monocytes Relative: 12.8 % — ABNORMAL HIGH (ref 3.0–12.0)
Neutro Abs: 3 10*3/uL (ref 1.4–7.7)
Neutrophils Relative %: 59.4 % (ref 43.0–77.0)
Platelets: 197 10*3/uL (ref 150.0–400.0)
RBC: 5.22 Mil/uL (ref 4.22–5.81)
RDW: 13.8 % (ref 11.5–15.5)
WBC: 5 10*3/uL (ref 4.0–10.5)

## 2018-06-11 NOTE — Progress Notes (Signed)
NEUROLOGY FOLLOW UP OFFICE NOTE  Devon Barron [id number]  HISTORY OF PRESENT ILLNESS: Devon Barron is a 57 year old right-handed man who follows up for multiple sclerosis.  UPDATE: Current disease modifying therapy: Rebif Other current medications: D3 4000 IU daily  Vision: No issues Motor: No issues Sensory: No issues Pain: No issues Gait: No issues Bowel/Bladder: No issues Fatigue: Mild Cognition: No issues Mood: Denies depression or anxiety  12/09/17 LABS:  CBC with WBC 5.5, HGB 14.1, HCT 41.4, PLT 210, ALC 1,342; CMP with Na 139, K 3.8, glucose 99, BUN 13, Cr 1.05, t bili 0.5, ALP 54, AST 17, ALT 18.  HISTORY: He was diagnosed with MS in 2001. At that time, he presented with left facial myokymia. He reports that he hasn't had many flare ups, and only a couple of flare-ups requiring pulse steroids. They typically present as blurred vision, headache, dizziness, unsteadiness and fatigue. He has both supratentorial and infratentorial demyelinating lesions. His cervical cord is okay. He has a history of lapses with physician follow-ups and non-adherence to disease-modifying agents, mostly due to financial reasons. He previously was on Avonex, and then Rebif. Due to financial reasons, he has not followed up with a neurologist in between 2013 and 2014 and had not taken his Rebif. He developed increased fatigue, stiffness, blurred vision and intermittent headaches, similar to prior flare ups. He presented to the ED on 05/03/13. Physical exam revealed slight right sided facial droop. Initially, a CT of the head was performed and revealed decreased attenuation at the right subfrontal-anterior temporal junction. MRI Brain w/wo contrast was performed, and revealed extensive periventricular and subcortical T2 hyperintensities, as well as focal restricted diffusion within the splenium of the corpus callosum, concerning for active demyelination. He has since restarted Rebif.  Last  MRI of brain with and without contrast from 11/03/16 was stable compared to prior MRIs from 02/14/15, 03/08/14 and 07/13/13.  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  PAST MEDICAL HISTORY: Past Medical History:  Diagnosis Date  . MS (multiple sclerosis) ([place])     MEDICATIONS: Current Outpatient Medications on File Prior to Visit  Medication Sig Dispense Refill  . cholecalciferol (VITAMIN D) 1000 UNITS tablet Take 1,000 Units by mouth daily.    Marland Kitchen LEVITRA 20 MG tablet     . REBIF 44 MCG/0.5ML SOSY injection INJECT ONE SYRINGE (44 MCG) SUBCUTANEOUSLY THREE TIMES PER WEEK. KEEPREFRIGERATED. 36 Syringe 2  . tadalafil (CIALIS) 5 MG tablet Take 1 tablet one hour prior to sexual activity.     No current facility-administered medications on file prior to visit.     ALLERGIES: No Known Allergies  FAMILY HISTORY: Family History  Problem Relation Age of Onset  . Hypertension Mother   . Hypertension Father   . Diabetes Sister   . Diabetes Brother   . Ataxia Neg Hx   . Chorea Neg Hx   . Dementia Neg Hx   . Mental retardation Neg Hx   . Migraines Neg Hx   . Multiple sclerosis Neg Hx   . Neurofibromatosis Neg Hx   . Neuropathy Neg Hx   . Parkinsonism Neg Hx   . Seizures Neg Hx   . Stroke Neg Hx   . Early death Neg Hx   . Cancer Neg Hx   . Alcohol abuse Neg Hx   . Hearing loss Neg Hx   . Heart disease Neg Hx   . Hyperlipidemia Neg Hx   . Kidney disease Neg Hx  SOCIAL HISTORY: Social History   Socioeconomic History  . Marital status: Single    Spouse name: Not on file  . Number of children: Not on file  . Years of education: Not on file  . Highest education level: Not on file  Occupational History  . Not on file  Social Needs  . Financial resource strain: Not on file  . Food insecurity:    Worry: Not on file    Inability: Not on file  . Transportation needs:    Medical: Not on file    Non-medical: Not on file  Tobacco Use  . Smoking status:  Never Smoker  . Smokeless tobacco: Never Used  Substance and Sexual Activity  . Alcohol use: Yes    Alcohol/week: 5.0 standard drinks    Types: 5 Cans of beer per week    Comment: Occ  . Drug use: No  . Sexual activity: Yes    Partners: Female  Lifestyle  . Physical activity:    Days per week: Not on file    Minutes per session: Not on file  . Stress: Not on file  Relationships  . Social connections:    Talks on phone: Not on file    Gets together: Not on file    Attends religious service: Not on file    Active member of club or organization: Not on file    Attends meetings of clubs or organizations: Not on file    Relationship status: Not on file  . Intimate partner violence:    Fear of current or ex partner: Not on file    Emotionally abused: Not on file    Physically abused: Not on file    Forced sexual activity: Not on file  Other Topics Concern  . Not on file  Social History Narrative  . Not on file    REVIEW OF SYSTEMS: Constitutional: No fevers, chills, or sweats, no generalized fatigue, change in appetite Eyes: No visual changes, double vision, eye pain Ear, nose and throat: No hearing loss, ear pain, nasal congestion, sore throat Cardiovascular: No chest pain, palpitations Respiratory:  No shortness of breath at rest or with exertion, wheezes GastrointestinaI: No nausea, vomiting, diarrhea, abdominal pain, fecal incontinence Genitourinary:  No dysuria, urinary retention or frequency Musculoskeletal:  No neck pain, back pain Integumentary: No rash, pruritus, skin lesions Neurological: as above Psychiatric: No depression, insomnia, anxiety Endocrine: No palpitations, fatigue, diaphoresis, mood swings, change in appetite, change in weight, increased thirst Hematologic/Lymphatic:  No purpura, petechiae. Allergic/Immunologic: no itchy/runny eyes, nasal congestion, recent allergic reactions, rashes  PHYSICAL EXAM: Blood pressure (!) 144/78, pulse 97, height 5\' 9"   (1.753 m), weight 200 lb (90.7 kg), SpO2 97 %. General: No acute distress.  Patient appears well-groomed.  Head:  Normocephalic/atraumatic Eyes:  Fundi examined but not visualized Neck: supple, no paraspinal tenderness, full range of motion Heart:  Regular rate and rhythm Lungs:  Clear to auscultation bilaterally Back: No paraspinal tenderness Neurological Exam: alert and oriented to person, place, and time. Attention span and concentration intact, recent and remote memory intact, fund of knowledge intact.  Speech fluent and not dysarthric, language intact.  CN II-XII intact. Bulk and tone normal, muscle strength 5/5 throughout.  Sensation to light touch, temperature and vibration intact.  Deep tendon reflexes 2+ throughout, toes downgoing.  Finger to nose and heel to shin testing intact.  Gait normal, Romberg negative.  IMPRESSION: Multiple sclerosis  PLAN: 1.  Continue Rebif and D3 4000 IU daily. 2.  Check CBC with differential and CMP today.  Check CBC with differential, CMP, and vitamin D in 6 months. 3.  Follow-up in 6 months after repeat labs.  25 minutes spent with the patient, over 50% spent discussing management.  Metta Clines, DO  CC: Scarlette Calico, MD

## 2018-06-11 NOTE — Patient Instructions (Addendum)
1.  Continue Rebif and D3 4000 IU daily 2.  Check CBC with diff and CMP today. 3.  Check CBC with diff, CMP and vitamin D in 6 months. 4.  Follow up in 6 months after repeat labs.  Your provider has requested that you have labwork completed today. Please go to [facility] ([id number]) on the second floor of this building before leaving the office today. You do not need to check in. If you are not called within 15 minutes please check with the front desk.

## 2018-06-17 ENCOUNTER — Telehealth: Payer: Self-pay

## 2018-06-17 NOTE — Telephone Encounter (Signed)
Sent mychart message

## 2018-06-17 NOTE — Telephone Encounter (Signed)
-----   Message from Pieter Partridge, DO sent at 06/12/2018  6:54 AM EST ----- Labs okay except blood sugar a little elevated.

## 2018-06-17 NOTE — Telephone Encounter (Signed)
Attempted to reach Pt, mailbox is full, unable to leave message

## 2018-06-18 ENCOUNTER — Telehealth: Payer: Self-pay | Admitting: Neurology

## 2018-06-18 NOTE — Telephone Encounter (Signed)
Patient called and left message with after hours service returning a missed call. Thanks

## 2018-06-19 NOTE — Telephone Encounter (Signed)
Called and advised Pt the call was about his lab results that I sent via my chart

## 2018-10-02 ENCOUNTER — Other Ambulatory Visit: Payer: Self-pay

## 2018-10-02 MED ORDER — INTERFERON BETA-1A 44 MCG/0.5ML ~~LOC~~ SOSY: PREFILLED_SYRINGE | SUBCUTANEOUS | 3 refills | Status: DC

## 2018-11-24 ENCOUNTER — Telehealth: Payer: Self-pay | Admitting: Neurology

## 2018-11-24 NOTE — Telephone Encounter (Signed)
Devon Barron is calling regarding patient and needing a Verbal order. You can call [phone] or Fax [phone]. He is calling from the [facility] Patient assistance Program. Thanks

## 2018-11-26 NOTE — Telephone Encounter (Signed)
Jonelle Sidle [phone] called regarding [facility] and following up on patient's Rx. Thanks

## 2018-11-27 NOTE — Telephone Encounter (Signed)
Called and gave verbal Rx, spoke with pharmacist Jeneen Rinks

## 2018-12-08 NOTE — Progress Notes (Signed)
Virtual Visit via Video Note The purpose of this virtual visit is to provide medical care while limiting exposure to the novel coronavirus.    Consent was obtained for video visit:  Yes.   Answered questions that patient had about telehealth interaction:  Yes.   I discussed the limitations, risks, security and privacy concerns of performing an evaluation and management service by telemedicine. I also discussed with the patient that there may be a patient responsible charge related to this service. The patient expressed understanding and agreed to proceed.  Pt location: Home Physician Location: Home Name of referring provider:  Janith Lima, MD I connected with Devon Barron at patients initiation/request on 12/09/2018 at 11:00 AM EDT by video enabled telemedicine application and verified that I am speaking with the correct person using two identifiers. Pt MRN:  [id number] Pt DOB:  09-Feb-1961 Video Participants:  Devon Barron   History of Present Illness:  Devon Barron is a 58 year old right-handed man who follows up for multiple sclerosis.  UPDATE: Current disease modifying therapy: Rebif Other current medications: D3 4000 IU daily  He is feeling well. Vision: No issues Motor: No issues Sensory: No issues Pain: No issues Gait: No issues Bowel/Bladder: No issues Fatigue: Mild Cognition: No issues Mood: Denies depression or anxiety  CBC with diff and CMP from 06/11/18 were unremarkable.  HISTORY: He was diagnosed with MS in 2001.At that time, he presented with left facial myokymia. He reports that he hasn't had many flare ups, and only a couple of flare-ups requiring pulse steroids.They typically present as blurred vision, headache, dizziness, unsteadiness and fatigue.He has both supratentorial and infratentorial demyelinating lesions.His cervical cord is okay.He has a history of lapses with physician follow-ups and non-adherence to disease-modifying agents,  mostly due to financial reasons.He previously was on Avonex, and then Rebif.Due to financial reasons, he has not followed up with a neurologist in between 2013 and 2014 and had not taken his Rebif.He developed increased fatigue, stiffness, blurred vision and intermittent headaches, similar to prior flare ups.He presented to the ED on 05/03/13.Physical exam revealed slight right sided facial droop.Initially, a CT of the head was performed and revealed decreased attenuation at the right subfrontal-anterior temporal junction.MRI Brain w/wo contrast was performed, and revealed extensive periventricular and subcortical T2 hyperintensities, as well as focal restricted diffusion within the splenium of the corpus callosum, concerning for active demyelination.He has since restarted Rebif.  Last MRI of brain with and without contrast from 11/03/16 was stable compared to prior MRIs from 02/14/15, 03/08/14 and 07/13/13.  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   Past Medical History: Past Medical History:  Diagnosis Date  . MS (multiple sclerosis) ([place])     Medications: Outpatient Encounter Medications as of 12/09/2018  Medication Sig Note  . cholecalciferol (VITAMIN D) 1000 UNITS tablet Take 1,000 Units by mouth daily.   . interferon beta-1a (REBIF) 44 MCG/0.5ML SOSY injection INJECT ONE SYRINGE (44 MCG) SUBCUTANEOUSLY THREE TIMES PER WEEK. KEEPREFRIGERATED.   Marland Kitchen LEVITRA 20 MG tablet  07/28/2013: Received from: External Pharmacy  . tadalafil (CIALIS) 5 MG tablet Take 1 tablet one hour prior to sexual activity. 03/20/2015: Received from: [facility]   No facility-administered encounter medications on file as of 12/09/2018.     Allergies: No Known Allergies  Family History: Family History  Problem Relation Age of Onset  . Hypertension Mother   . Hypertension Father   . Diabetes Sister   .  Diabetes Brother   . Ataxia Neg Hx   . Chorea Neg Hx   . Dementia Neg  Hx   . Mental retardation Neg Hx   . Migraines Neg Hx   . Multiple sclerosis Neg Hx   . Neurofibromatosis Neg Hx   . Neuropathy Neg Hx   . Parkinsonism Neg Hx   . Seizures Neg Hx   . Stroke Neg Hx   . Early death Neg Hx   . Cancer Neg Hx   . Alcohol abuse Neg Hx   . Hearing loss Neg Hx   . Heart disease Neg Hx   . Hyperlipidemia Neg Hx   . Kidney disease Neg Hx     Social History: Social History   Socioeconomic History  . Marital status: Single    Spouse name: Not on file  . Number of children: Not on file  . Years of education: Not on file  . Highest education level: Not on file  Occupational History  . Not on file  Social Needs  . Financial resource strain: Not on file  . Food insecurity:    Worry: Not on file    Inability: Not on file  . Transportation needs:    Medical: Not on file    Non-medical: Not on file  Tobacco Use  . Smoking status: Never Smoker  . Smokeless tobacco: Never Used  Substance and Sexual Activity  . Alcohol use: Yes    Alcohol/week: 5.0 standard drinks    Types: 5 Cans of beer per week    Comment: Occ  . Drug use: No  . Sexual activity: Yes    Partners: Female  Lifestyle  . Physical activity:    Days per week: Not on file    Minutes per session: Not on file  . Stress: Not on file  Relationships  . Social connections:    Talks on phone: Not on file    Gets together: Not on file    Attends religious service: Not on file    Active member of club or organization: Not on file    Attends meetings of clubs or organizations: Not on file    Relationship status: Not on file  . Intimate partner violence:    Fear of current or ex partner: Not on file    Emotionally abused: Not on file    Physically abused: Not on file    Forced sexual activity: Not on file  Other Topics Concern  . Not on file  Social History Narrative  . Not on file    Observations/Objective:   Height 5\' 9"  (1.753 m), weight 202 lb (91.6 kg). Alert and oriented.   Speech fluent and not dysarthric.  Language intact.  Face symmetric    Assessment and Plan:   Multiple sclerosis, stable  1.  Rebif 2.  D3 4000 IU daily 3.  Check CBC with diff, CMP and vitamin D level sometime in the next week and repeat in 6 months. 4.  Follow up in 6 months (following repeat labs)  Follow Up Instructions:    -I discussed the assessment and treatment plan with the patient. The patient was provided an opportunity to ask questions and all were answered. The patient agreed with the plan and demonstrated an understanding of the instructions.   The patient was advised to call back or seek an in-person evaluation if the symptoms worsen or if the condition fails to improve as anticipated.    Total Time spent in visit  with the patient was: 15 minutes  Dudley Major, DO

## 2018-12-09 ENCOUNTER — Other Ambulatory Visit: Payer: Self-pay

## 2018-12-09 ENCOUNTER — Encounter: Payer: Self-pay | Admitting: Neurology

## 2018-12-09 ENCOUNTER — Telehealth (INDEPENDENT_AMBULATORY_CARE_PROVIDER_SITE_OTHER): Payer: BLUE CROSS/BLUE SHIELD | Admitting: Neurology

## 2018-12-09 VITALS — Ht 69.0 in | Wt 202.0 lb

## 2018-12-09 DIAGNOSIS — G35 Multiple sclerosis: Secondary | ICD-10-CM

## 2018-12-11 ENCOUNTER — Ambulatory Visit: Payer: Self-pay | Admitting: Neurology

## 2018-12-13 ENCOUNTER — Encounter: Payer: Self-pay | Admitting: Internal Medicine

## 2018-12-14 ENCOUNTER — Ambulatory Visit: Payer: Self-pay | Admitting: Neurology

## 2018-12-14 ENCOUNTER — Encounter: Payer: Self-pay | Admitting: Internal Medicine

## 2018-12-14 ENCOUNTER — Other Ambulatory Visit: Payer: Self-pay | Admitting: Internal Medicine

## 2018-12-14 DIAGNOSIS — M5412 Radiculopathy, cervical region: Secondary | ICD-10-CM

## 2018-12-14 MED ORDER — TIZANIDINE HCL 2 MG PO CAPS: 2.0000 mg | ORAL_CAPSULE | Freq: Three times a day (TID) | ORAL | 0 refills | Status: DC | PRN

## 2018-12-23 ENCOUNTER — Other Ambulatory Visit: Payer: Self-pay

## 2018-12-23 ENCOUNTER — Other Ambulatory Visit (INDEPENDENT_AMBULATORY_CARE_PROVIDER_SITE_OTHER): Payer: BLUE CROSS/BLUE SHIELD

## 2018-12-23 DIAGNOSIS — Z79899 Other long term (current) drug therapy: Secondary | ICD-10-CM | POA: Diagnosis not present

## 2018-12-23 DIAGNOSIS — E559 Vitamin D deficiency, unspecified: Secondary | ICD-10-CM

## 2018-12-23 LAB — COMPREHENSIVE METABOLIC PANEL
ALT: 26 U/L (ref 0–53)
AST: 20 U/L (ref 0–37)
Albumin: 4.1 g/dL (ref 3.5–5.2)
Alkaline Phosphatase: 47 U/L (ref 39–117)
BUN: 10 mg/dL (ref 6–23)
CO2: 29 mEq/L (ref 19–32)
Calcium: 9.2 mg/dL (ref 8.4–10.5)
Chloride: 102 mEq/L (ref 96–112)
Creatinine, Ser: 0.98 mg/dL (ref 0.40–1.50)
GFR: 95.12 mL/min (ref >60.00)
Glucose, Bld: 109 mg/dL — ABNORMAL HIGH (ref 70–99)
Potassium: 4.5 mEq/L (ref 3.5–5.1)
Sodium: 137 mEq/L (ref 135–145)
Total Bilirubin: 0.5 mg/dL (ref 0.2–1.2)
Total Protein: 7.4 g/dL (ref 6.0–8.3)

## 2018-12-23 LAB — CBC WITH DIFFERENTIAL/PLATELET
Basophils Absolute: 0.1 10*3/uL (ref 0.0–0.1)
Basophils Relative: 1.3 % (ref 0.0–3.0)
Eosinophils Absolute: 0.3 10*3/uL (ref 0.0–0.7)
Eosinophils Relative: 5.1 % — ABNORMAL HIGH (ref 0.0–5.0)
HCT: 44.5 % (ref 39.0–52.0)
Hemoglobin: 14.7 g/dL (ref 13.0–17.0)
Lymphocytes Relative: 18.4 % (ref 12.0–46.0)
Lymphs Abs: 1.2 10*3/uL (ref 0.7–4.0)
MCHC: 32.9 g/dL (ref 30.0–36.0)
MCV: 88.2 fl (ref 78.0–100.0)
Monocytes Absolute: 0.8 10*3/uL (ref 0.1–1.0)
Monocytes Relative: 13.3 % — ABNORMAL HIGH (ref 3.0–12.0)
Neutro Abs: 3.9 10*3/uL (ref 1.4–7.7)
Neutrophils Relative %: 61.9 % (ref 43.0–77.0)
Platelets: 230 10*3/uL (ref 150.0–400.0)
RBC: 5.05 Mil/uL (ref 4.22–5.81)
RDW: 14.3 % (ref 11.5–15.5)
WBC: 6.4 10*3/uL (ref 4.0–10.5)

## 2018-12-23 LAB — VITAMIN D 25 HYDROXY (VIT D DEFICIENCY, FRACTURES): VITD: 52.11 ng/mL (ref 30.00–100.00)

## 2018-12-24 ENCOUNTER — Telehealth: Payer: Self-pay

## 2018-12-24 DIAGNOSIS — E559 Vitamin D deficiency, unspecified: Secondary | ICD-10-CM

## 2018-12-24 DIAGNOSIS — G35 Multiple sclerosis: Secondary | ICD-10-CM

## 2018-12-24 NOTE — Telephone Encounter (Signed)
-----   Message from Pieter Partridge, DO sent at 12/24/2018  7:58 AM EDT ----- Labs look okay.  Continue current vitamin D dose.  Repeat CBC with diff, CMP and vitamin D in 6 months (prior to his follow up)

## 2018-12-24 NOTE — Telephone Encounter (Signed)
Called and advised Pt of lab results, and to have labs one week prior to 6 month f/u ov

## 2019-05-03 ENCOUNTER — Encounter: Payer: Self-pay | Admitting: *Deleted

## 2019-05-03 NOTE — Progress Notes (Signed)
Faxed the PA request to [facility]. Waiting for response. 04/30/2019

## 2019-05-07 ENCOUNTER — Telehealth: Payer: Self-pay | Admitting: *Deleted

## 2019-05-07 NOTE — Telephone Encounter (Signed)
Complete FMLA paper and sent to front staff.

## 2019-06-01 ENCOUNTER — Encounter: Payer: Self-pay | Admitting: Internal Medicine

## 2019-06-02 ENCOUNTER — Encounter: Payer: Self-pay | Admitting: Internal Medicine

## 2019-06-03 ENCOUNTER — Ambulatory Visit: Payer: BLUE CROSS/BLUE SHIELD

## 2019-06-10 ENCOUNTER — Ambulatory Visit: Payer: BC Managed Care – PPO

## 2019-06-10 ENCOUNTER — Encounter: Payer: Self-pay | Admitting: Internal Medicine

## 2019-06-10 NOTE — Progress Notes (Signed)
NEUROLOGY FOLLOW UP OFFICE NOTE  Devon Barron [id number]  HISTORY OF PRESENT ILLNESS: Devon Barron is a 58 year old right-handed man who follows up for multiple sclerosis.  UPDATE: Current disease modifying therapy: Rebif Other current medications: D3 4000 IU daily  12/23/2018 LABS:  CBC with WBC 6.4, HGB 14.7, HCT 44.5, PLT 230, ALC 1.2; CMP with Na 137, K 4.5, Cl 102, CO2 29, glucose 109, BUN 10, Cr 0.98, t bili 0.5, ALP 47, AST 20, ALT 26; vitamin D 52.11  He is feeling well. Vision:No issues Motor:No issues Sensory:No issues Pain: No issues Gait:No issues Bowel/Bladder:No issues Fatigue:Mild Cognition:No issues Mood:Denies depression or anxiety  CBC with diff and CMP from 06/11/18 were unremarkable.  HISTORY: He was diagnosed with MS in 2001.At that time, he presented with left facial myokymia. He reports that he hasn't had many flare ups, and only a couple of flare-ups requiring pulse steroids.They typically present as blurred vision, headache, dizziness, unsteadiness and fatigue.He has both supratentorial and infratentorial demyelinating lesions.His cervical cord is okay.He has a history of lapses with physician follow-ups and non-adherence to disease-modifying agents, mostly due to financial reasons.He previously was on Avonex, and then Rebif.Due to financial reasons, he has not followed up with a neurologist in between 2013 and 2014 and had not taken his Rebif.He developed increased fatigue, stiffness, blurred vision and intermittent headaches, similar to prior flare ups.He presented to the ED on 05/03/13.Physical exam revealed slight right sided facial droop.Initially, a CT of the head was performed and revealed decreased attenuation at the right subfrontal-anterior temporal junction.MRI Brain w/wo contrast was performed, and revealed extensive periventricular and subcortical T2 hyperintensities, as well as focal restricted diffusion within  the splenium of the corpus callosum, concerning for active demyelination.He has since restarted Rebif.  Last MRI of brain with and without contrast from 11/03/16 was stable compared to prior MRIs from 02/14/15, 03/08/14 and 07/13/13.  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  PAST MEDICAL HISTORY: Past Medical History:  Diagnosis Date   MS (multiple sclerosis) (Devon Barron)     MEDICATIONS: Current Outpatient Medications on File Prior to Visit  Medication Sig Dispense Refill   cholecalciferol (VITAMIN D) 1000 UNITS tablet Take 1,000 Units by mouth daily.     interferon beta-1a (REBIF) 44 MCG/0.5ML SOSY injection INJECT ONE SYRINGE (44 MCG) SUBCUTANEOUSLY THREE TIMES PER WEEK. KEEPREFRIGERATED. 36 Syringe 3   LEVITRA 20 MG tablet      tadalafil (CIALIS) 5 MG tablet Take 1 tablet one hour prior to sexual activity.     tizanidine (ZANAFLEX) 2 MG capsule Take 1 capsule (2 mg total) by mouth 3 (three) times daily as needed for muscle spasms. 90 capsule 0   No current facility-administered medications on file prior to visit.     ALLERGIES: No Known Allergies  FAMILY HISTORY: Family History  Problem Relation Age of Onset   Hypertension Mother    Hypertension Father    Diabetes Sister    Diabetes Brother    Ataxia Neg Hx    Chorea Neg Hx    Dementia Neg Hx    Mental retardation Neg Hx    Migraines Neg Hx    Multiple sclerosis Neg Hx    Neurofibromatosis Neg Hx    Neuropathy Neg Hx    Parkinsonism Neg Hx    Seizures Neg Hx    Stroke Neg Hx    Early death Neg Hx    Cancer Neg Hx  Alcohol abuse Neg Hx    Hearing loss Neg Hx    Heart disease Neg Hx    Hyperlipidemia Neg Hx    Kidney disease Neg Hx     SOCIAL HISTORY: Social History   Socioeconomic History   Marital status: Single    Spouse name: Not on file   Number of children: 2   Years of education: Not on file   Highest education level: Some college, no degree   Occupational History   Occupation: Education administrator: [employer]  Social Designer, fashion/clothing strain: Not on file   Food insecurity    Worry: Not on file    Inability: Not on file   Transportation needs    Medical: Not on file    Non-medical: Not on file  Tobacco Use   Smoking status: Never Smoker   Smokeless tobacco: Never Used  Substance and Sexual Activity   Alcohol use: Yes    Alcohol/week: 5.0 standard drinks    Types: 5 Cans of beer per week    Comment: Occ   Drug use: No   Sexual activity: Yes    Partners: Female  Lifestyle   Physical activity    Days per week: Not on file    Minutes per session: Not on file   Stress: Not on file  Relationships   Social connections    Talks on phone: Not on file    Gets together: Not on file    Attends religious service: Not on file    Active member of club or organization: Not on file    Attends meetings of clubs or organizations: Not on file    Relationship status: Not on file   Intimate partner violence    Fear of current or ex partner: Not on file    Emotionally abused: Not on file    Physically abused: Not on file    Forced sexual activity: Not on file  Other Topics Concern   Not on file  Social History Narrative   Patient is right-handed. His grown son lives with him in a 2 level home. He uses a home exercise machine 2 x weekly.    REVIEW OF SYSTEMS: Constitutional: No fevers, chills, or sweats, no generalized fatigue, change in appetite Eyes: No visual changes, double vision, eye pain Ear, nose and throat: No hearing loss, ear pain, nasal congestion, sore throat Cardiovascular: No chest pain, palpitations Respiratory:  No shortness of breath at rest or with exertion, wheezes GastrointestinaI: No nausea, vomiting, diarrhea, abdominal pain, fecal incontinence Genitourinary:  No dysuria, urinary retention or frequency Musculoskeletal:  No neck pain, back pain Integumentary: No rash, pruritus, skin  lesions Neurological: as above Psychiatric: No depression, insomnia, anxiety Endocrine: No palpitations, fatigue, diaphoresis, mood swings, change in appetite, change in weight, increased thirst Hematologic/Lymphatic:  No purpura, petechiae. Allergic/Immunologic: no itchy/runny eyes, nasal congestion, recent allergic reactions, rashes  PHYSICAL EXAM: Blood pressure 128/88, pulse 87, height 5\' 9"  (1.753 m), weight 201 lb 9.6 oz (91.4 kg), SpO2 98 %.  General: No acute distress.  Patient appears well-groomed.   Head:  Normocephalic/atraumatic Eyes:  Fundi examined but not visualized Neck: supple, no paraspinal tenderness, full range of motion Heart:  Regular rate and rhythm Lungs:  Clear to auscultation bilaterally Back: No paraspinal tenderness Neurological Exam: alert and oriented to person, place, and time. Attention span and concentration intact, recent and remote memory intact, fund of knowledge intact.  Speech fluent and not dysarthric, language  intact.  CN II-XII intact. Bulk and tone normal, muscle strength 5/5 throughout.  Sensation to light touch, temperature and vibration intact.  Deep tendon reflexes 2+ throughout, toes downgoing.  Finger to nose and heel to shin testing intact.  Gait normal, Romberg negative.  IMPRESSION: Multiple sclerosis  PLAN: 1.  Rebif for DMT 2.  Check CBC with diff, CMP and vitamin D today and repeat in 6 months. 3.  Repeat MRI of brain with and without contrast in 6 months 4.  Follow up in 6 months after repeat labs and MRI  25 minutes spent face to face with patient, 50% spent discussing management.    Devon Clines, DO  CC:  Scarlette Calico, MD

## 2019-06-14 ENCOUNTER — Ambulatory Visit (INDEPENDENT_AMBULATORY_CARE_PROVIDER_SITE_OTHER): Payer: BC Managed Care – PPO | Admitting: Neurology

## 2019-06-14 ENCOUNTER — Encounter: Payer: Self-pay | Admitting: Neurology

## 2019-06-14 ENCOUNTER — Other Ambulatory Visit: Payer: Self-pay

## 2019-06-14 VITALS — BP 128/88 | HR 87 | Ht 69.0 in | Wt 201.6 lb

## 2019-06-14 DIAGNOSIS — G35 Multiple sclerosis: Secondary | ICD-10-CM

## 2019-06-14 NOTE — Patient Instructions (Addendum)
1.  Continue Rebif and D3 4000 IU daily 2.  Check CBC with diff, CMP and vitamin D now and again in 6 months 3.  Check MRI of brain with and without contrast in 6 months 4.  Follow up in 6 months (after repeat testing)

## 2019-06-16 ENCOUNTER — Other Ambulatory Visit (INDEPENDENT_AMBULATORY_CARE_PROVIDER_SITE_OTHER): Payer: BC Managed Care – PPO

## 2019-06-16 ENCOUNTER — Other Ambulatory Visit: Payer: Self-pay

## 2019-06-16 DIAGNOSIS — G35 Multiple sclerosis: Secondary | ICD-10-CM | POA: Diagnosis not present

## 2019-06-16 LAB — CBC WITH DIFFERENTIAL/PLATELET
Basophils Absolute: 0.1 10*3/uL (ref 0.0–0.1)
Basophils Relative: 0.7 % (ref 0.0–3.0)
Eosinophils Absolute: 0.2 10*3/uL (ref 0.0–0.7)
Eosinophils Relative: 2.3 % (ref 0.0–5.0)
HCT: 44.7 % (ref 39.0–52.0)
Hemoglobin: 14.5 g/dL (ref 13.0–17.0)
Lymphocytes Relative: 23.5 % (ref 12.0–46.0)
Lymphs Abs: 1.6 10*3/uL (ref 0.7–4.0)
MCHC: 32.4 g/dL (ref 30.0–36.0)
MCV: 88 fl (ref 78.0–100.0)
Monocytes Absolute: 0.5 10*3/uL (ref 0.1–1.0)
Monocytes Relative: 7.7 % (ref 3.0–12.0)
Neutro Abs: 4.5 10*3/uL (ref 1.4–7.7)
Neutrophils Relative %: 65.8 % (ref 43.0–77.0)
Platelets: 245 10*3/uL (ref 150.0–400.0)
RBC: 5.08 Mil/uL (ref 4.22–5.81)
RDW: 13.9 % (ref 11.5–15.5)
WBC: 6.8 10*3/uL (ref 4.0–10.5)

## 2019-06-16 LAB — VITAMIN D 25 HYDROXY (VIT D DEFICIENCY, FRACTURES): VITD: 54.49 ng/mL (ref 30.00–100.00)

## 2019-06-17 ENCOUNTER — Telehealth: Payer: Self-pay

## 2019-06-17 LAB — COMPLETE METABOLIC PANEL WITH GFR
AG Ratio: 1.4 (calc) (ref 1.0–2.5)
ALT: 21 U/L (ref 9–46)
AST: 15 U/L (ref 10–35)
Albumin: 4.3 g/dL (ref 3.6–5.1)
Alkaline phosphatase (APISO): 50 U/L (ref 35–144)
BUN: 11 mg/dL (ref 7–25)
CO2: 26 mmol/L (ref 20–32)
Calcium: 9.5 mg/dL (ref 8.6–10.3)
Chloride: 104 mmol/L (ref 98–110)
Creat: 1.02 mg/dL (ref 0.70–1.33)
GFR, Est African American: 93 mL/min/{1.73_m2} (ref >=60)
GFR, Est Non African American: 81 mL/min/{1.73_m2} (ref >=60)
Globulin: 3.1 g/dL (calc) (ref 1.9–3.7)
Glucose, Bld: 110 mg/dL — ABNORMAL HIGH (ref 65–99)
Potassium: 3.9 mmol/L (ref 3.5–5.3)
Sodium: 139 mmol/L (ref 135–146)
Total Bilirubin: 0.5 mg/dL (ref 0.2–1.2)
Total Protein: 7.4 g/dL (ref 6.1–8.1)

## 2019-06-17 NOTE — Telephone Encounter (Signed)
Called spoke with patient he was informed and understands results.

## 2019-06-17 NOTE — Telephone Encounter (Signed)
-----   Message from Pieter Partridge, DO sent at 06/17/2019  8:06 AM EST ----- Labs look okay

## 2019-07-05 NOTE — Progress Notes (Signed)
Rebif approved through [facility] 07/04/2019-07/03/2020.

## 2019-07-14 ENCOUNTER — Encounter: Payer: Self-pay | Admitting: Internal Medicine

## 2019-07-22 ENCOUNTER — Ambulatory Visit (INDEPENDENT_AMBULATORY_CARE_PROVIDER_SITE_OTHER): Payer: BC Managed Care – PPO

## 2019-07-22 ENCOUNTER — Other Ambulatory Visit: Payer: Self-pay

## 2019-07-22 DIAGNOSIS — Z23 Encounter for immunization: Secondary | ICD-10-CM | POA: Diagnosis not present

## 2019-09-13 ENCOUNTER — Other Ambulatory Visit: Payer: Self-pay | Admitting: Neurology

## 2019-12-02 ENCOUNTER — Ambulatory Visit
Admission: RE | Admit: 2019-12-02 | Discharge: 2019-12-02 | Disposition: A | Discharge status: Home / Self Care | Payer: BC Managed Care – PPO | Source: Ambulatory Visit | Attending: Neurology | Admitting: Neurology

## 2019-12-02 DIAGNOSIS — G35 Multiple sclerosis: Secondary | ICD-10-CM

## 2019-12-02 MED ORDER — GADOBENATE DIMEGLUMINE 529 MG/ML IV SOLN
20.0000 mL | Freq: Once | INTRAVENOUS | Status: AC | PRN
  Administered 2019-12-02: 20 mL via INTRAVENOUS

## 2019-12-13 ENCOUNTER — Other Ambulatory Visit: Payer: Self-pay

## 2019-12-13 ENCOUNTER — Other Ambulatory Visit: Payer: BC Managed Care – PPO

## 2019-12-13 ENCOUNTER — Ambulatory Visit (INDEPENDENT_AMBULATORY_CARE_PROVIDER_SITE_OTHER): Payer: BC Managed Care – PPO | Admitting: Neurology

## 2019-12-13 ENCOUNTER — Encounter: Payer: Self-pay | Admitting: Neurology

## 2019-12-13 VITALS — BP 144/82 | HR 98 | Ht 69.0 in | Wt 218.6 lb

## 2019-12-13 DIAGNOSIS — G35 Multiple sclerosis: Secondary | ICD-10-CM

## 2019-12-13 LAB — CBC WITH DIFFERENTIAL/PLATELET
Basophils Absolute: 0.1 10*3/uL (ref 0.0–0.1)
Basophils Relative: 1.1 % (ref 0.0–3.0)
Eosinophils Absolute: 0.1 10*3/uL (ref 0.0–0.7)
Eosinophils Relative: 1.5 % (ref 0.0–5.0)
HCT: 42.2 % (ref 39.0–52.0)
Hemoglobin: 13.9 g/dL (ref 13.0–17.0)
Lymphocytes Relative: 25.5 % (ref 12.0–46.0)
Lymphs Abs: 1.4 10*3/uL (ref 0.7–4.0)
MCHC: 32.9 g/dL (ref 30.0–36.0)
MCV: 87.6 fl (ref 78.0–100.0)
Monocytes Absolute: 0.7 10*3/uL (ref 0.1–1.0)
Monocytes Relative: 13.1 % — ABNORMAL HIGH (ref 3.0–12.0)
Neutro Abs: 3.3 10*3/uL (ref 1.4–7.7)
Neutrophils Relative %: 58.8 % (ref 43.0–77.0)
Platelets: 233 10*3/uL (ref 150.0–400.0)
RBC: 4.82 Mil/uL (ref 4.22–5.81)
RDW: 14 % (ref 11.5–15.5)
WBC: 5.6 10*3/uL (ref 4.0–10.5)

## 2019-12-13 LAB — COMPREHENSIVE METABOLIC PANEL
ALT: 25 U/L (ref 0–53)
AST: 18 U/L (ref 0–37)
Albumin: 4.3 g/dL (ref 3.5–5.2)
Alkaline Phosphatase: 67 U/L (ref 39–117)
BUN: 9 mg/dL (ref 6–23)
CO2: 29 mEq/L (ref 19–32)
Calcium: 9.4 mg/dL (ref 8.4–10.5)
Chloride: 103 mEq/L (ref 96–112)
Creatinine, Ser: 1.13 mg/dL (ref 0.40–1.50)
GFR: 80.43 mL/min (ref >60.00)
Glucose, Bld: 107 mg/dL — ABNORMAL HIGH (ref 70–99)
Potassium: 4 mEq/L (ref 3.5–5.1)
Sodium: 137 mEq/L (ref 135–145)
Total Bilirubin: 0.4 mg/dL (ref 0.2–1.2)
Total Protein: 7.8 g/dL (ref 6.0–8.3)

## 2019-12-13 LAB — VITAMIN D 25 HYDROXY (VIT D DEFICIENCY, FRACTURES): VITD: 50.52 ng/mL (ref 30.00–100.00)

## 2019-12-13 NOTE — Patient Instructions (Addendum)
1.  Continue Rebif 2.  Continue vitamin D 4000 IU daily 3.  Check cbc with diff, CMP and vitamin D level and again in 6 months. Your provider has requested that you have labwork completed today. Please go to [facility] ([id number]) on the second floor of this building before leaving the office today. You do not need to check in. If you are not called within 15 minutes please check with the front desk.  4.  Follow up in 6 months.

## 2019-12-13 NOTE — Progress Notes (Signed)
NEUROLOGY FOLLOW UP OFFICE NOTE  Devon Barron [id number]  HISTORY OF PRESENT ILLNESS: Devon Barron is a 59 year old right-handed man who follows up for multiple sclerosis.  UPDATE: Current disease modifying therapy: Rebif Other current medications: D3 4000 IU daily  12/02/2019 MRI BRAIN W WO (personally reviewed):  Chronically advanced multiple sclerosis with no areas of active demyelination identified.  However, multifocal posterior frontal and parietal lobe cortical involvement appears mildly progressed since 2018... Evidence of chronic demyelinating plaque in the dorsal upper spinal cord at C2.  06/16/2019 LABS:  CBC with WBC 6.8, HGB 14.5, HCT 44.7, PLT 245, ALC 1.6; CMP with Na 139, K 3.9, Cl 104, CO2 26, glucose 110, BUN 11, Cr 1.02, t bili 0.5, ALP 50, AST 15, ALT 21; Vit D 54.49  He is feeling well. Vision:No issues Motor:No issues Sensory:No issues Pain: No issues Gait:No issues Bowel/Bladder:No issues Fatigue:Mild Cognition:No issues Mood:Denies depression or anxiety  CBC with diff and CMP from 06/11/18 were unremarkable.  HISTORY: He was diagnosed with MS in 2001.At that time, he presented with left facial myokymia. He reports that he hasn't had many flare ups, and only a couple of flare-ups requiring pulse steroids.They typically present as blurred vision, headache, dizziness, unsteadiness and fatigue.He has both supratentorial and infratentorial demyelinating lesions.His cervical cord is okay.He has a history of lapses with physician follow-ups and non-adherence to disease-modifying agents, mostly due to financial reasons.He previously was on Avonex, and then Rebif.Due to financial reasons, he has not followed up with a neurologist in between 2013 and 2014 and had not taken his Rebif.He developed increased fatigue, stiffness, blurred vision and intermittent headaches, similar to prior flare ups.He presented to the ED on 05/03/13.Physical  exam revealed slight right sided facial droop.Initially, a CT of the head was performed and revealed decreased attenuation at the right subfrontal-anterior temporal junction.MRI Brain w/wo contrast was performed, and revealed extensive periventricular and subcortical T2 hyperintensities, as well as focal restricted diffusion within the splenium of the corpus callosum, concerning for active demyelination.He has since restarted Rebif.  Last MRI of brain with and without contrast from 11/03/16 was stable compared to prior MRIs from 02/14/15, 03/08/14 and 07/13/13.  He works for [employer], either lifting packages or standing and scanning.  PAST MEDICAL HISTORY: Past Medical History:  Diagnosis Date   MS (multiple sclerosis) ([place])     MEDICATIONS: Current Outpatient Medications on File Prior to Visit  Medication Sig Dispense Refill   cholecalciferol (VITAMIN D) 1000 UNITS tablet Take 1,000 Units by mouth daily.     LEVITRA 20 MG tablet      REBIF 44 MCG/0.5ML SOSY injection INJECT ONE SYRINGE SUBCUTANEOUSLY THREE TIMES PER WEEK. KEEP REFRIGERATED. 0.5 mL 2   tadalafil (CIALIS) 5 MG tablet Take 1 tablet one hour prior to sexual activity.     tizanidine (ZANAFLEX) 2 MG capsule Take 1 capsule (2 mg total) by mouth 3 (three) times daily as needed for muscle spasms. 90 capsule 0   No current facility-administered medications on file prior to visit.    ALLERGIES: No Known Allergies  FAMILY HISTORY: Family History  Problem Relation Age of Onset   Hypertension Mother    Hypertension Father    Diabetes Sister    Diabetes Brother    Ataxia Neg Hx    Chorea Neg Hx    Dementia Neg Hx    Mental retardation Neg Hx    Migraines Neg Hx    Multiple sclerosis Neg Hx  Neurofibromatosis Neg Hx    Neuropathy Neg Hx    Parkinsonism Neg Hx    Seizures Neg Hx    Stroke Neg Hx    Early death Neg Hx    Cancer Neg Hx    Alcohol abuse Neg Hx    Hearing loss Neg Hx    Heart  disease Neg Hx    Hyperlipidemia Neg Hx    Kidney disease Neg Hx    SOCIAL HISTORY: Social History   Socioeconomic History   Marital status: Single    Spouse name: Not on file   Number of children: 1   Years of education: Not on file   Highest education level: Some college, no degree  Occupational History   Occupation: DRIVER    Employer: [employer]  Tobacco Use   Smoking status: Never Smoker   Smokeless tobacco: Never Used  Substance and Sexual Activity   Alcohol use: Yes    Alcohol/week: 5.0 standard drinks    Types: 5 Cans of beer per week    Comment: Occ   Drug use: No   Sexual activity: Yes    Partners: Female  Other Topics Concern   Not on file  Social History Narrative   Patient is right-handed. His grown son lives with him in a 2 level home. He uses a home exercise machine 2 x weekly.   Social Determinants of Health   Financial Resource Strain:    Difficulty of Paying Living Expenses:   Food Insecurity:    Worried About Charity fundraiser in the Last Year:    Arboriculturist in the Last Year:   Transportation Needs:    Film/video editor (Medical):    Lack of Transportation (Non-Medical):   Physical Activity:    Days of Exercise per Week:    Minutes of Exercise per Session:   Stress:    Feeling of Stress :   Social Connections:    Frequency of Communication with Friends and Family:    Frequency of Social Gatherings with Friends and Family:    Attends Religious Services:    Active Member of Clubs or Organizations:    Attends Music therapist:    Marital Status:   Intimate Partner Violence:    Fear of Current or Ex-Partner:    Emotionally Abused:    Physically Abused:    Sexually Abused:     PHYSICAL EXAM: Blood pressure (!) 144/82, pulse 98, height 5\' 9"  (1.753 m), weight 218 lb 9.6 oz (99.2 kg), SpO2 98 %.  General: No acute distress.  Patient appears well-groomed.   Head:   Normocephalic/atraumatic Eyes:  Fundi examined but not visualized Neck: supple, no paraspinal tenderness, full range of motion Heart:  Regular rate and rhythm Lungs:  Clear to auscultation bilaterally Back: No paraspinal tenderness Neurological Exam: alert and oriented to person, place, and time. Attention span and concentration intact, recent and remote memory intact, fund of knowledge intact.  Speech fluent and not dysarthric, language intact.  CN II-XII intact. Bulk and tone normal, muscle strength 5/5 throughout.  Sensation to light touch, temperature and vibration intact.  Deep tendon reflexes 2+ throughout, toes downgoing.  Finger to nose and heel to shin testing intact.  Gait normal, Romberg negative.  IMPRESSION: Multiple sclerosis.  Very mild progression on brain MRI.  At this time, we will not make change to DMT and monitor more closely for change  PLAN: 1.  DMT:  Rebif 2.  Continue vitamin D3  4000 IU daily 3.  Check CBC with diff, CMP and vitamin D today and again in 6 months 3.  Repeat MRI of brain with and without contrast in one year. 4.  Follow up in 6 months.  Metta Clines, DO  CC: Scarlette Calico, MD

## 2020-03-14 LAB — PSA: PSA: 13.9

## 2020-03-23 ENCOUNTER — Other Ambulatory Visit: Payer: Self-pay | Admitting: Neurology

## 2020-06-09 ENCOUNTER — Other Ambulatory Visit (HOSPITAL_COMMUNITY): Payer: Self-pay | Admitting: Urology

## 2020-06-09 ENCOUNTER — Other Ambulatory Visit: Payer: Self-pay | Admitting: Urology

## 2020-06-09 DIAGNOSIS — C61 Malignant neoplasm of prostate: Secondary | ICD-10-CM

## 2020-06-13 NOTE — Progress Notes (Deleted)
NEUROLOGY FOLLOW UP OFFICE NOTE  Devon Barron [id number]   Subjective:  Devon Barron is a 59year old right-handed man who follows up for multiple sclerosis  UPDATE: Current disease modifying therapy: Rebif Other current medications: D3 4000 IU daily    06/16/2019 LABS:  CBC with WBC 6.8, HGB 14.5, HCT 44.7, PLT 245, ALC 1.6; CMP with Na 139, K 3.9, Cl 104, CO2 26, glucose 110, BUN 11, Cr 1.02, t bili 0.5, ALP 50, AST 15, ALT 21; Vit D 54.49  He is feeling well. Vision:No issues Motor:No issues Sensory:No issues Pain: No issues Gait:No issues Bowel/Bladder:No issues Fatigue:Mild Cognition:No issues Mood:Denies depression or anxiety  CBC with diff and CMP from 06/11/18 were unremarkable.  HISTORY: He was diagnosed with MS in 2001.At that time, he presented with left facial myokymia. He reports that he hasn't had many flare ups, and only a couple of flare-ups requiring pulse steroids.They typically present as blurred vision, headache, dizziness, unsteadiness and fatigue.He has both supratentorial and infratentorial demyelinating lesions.His cervical cord is okay.He has a history of lapses with physician follow-ups and non-adherence to disease-modifying agents, mostly due to financial reasons.He previously was on Avonex, and then Rebif.Due to financial reasons, he has not followed up with a neurologist in between 2013 and 2014 and had not taken his Rebif.He developed increased fatigue, stiffness, blurred vision and intermittent headaches, similar to prior flare ups.He presented to the ED on 05/03/13.Physical exam revealed slight right sided facial droop.Initially, a CT of the head was performed and revealed decreased attenuation at the right subfrontal-anterior temporal junction.MRI Brain w/wo contrast was performed, and revealed extensive periventricular and subcortical T2 hyperintensities, as well as focal restricted diffusion within the splenium  of the corpus callosum, concerning for active demyelination.He has since restarted Rebif.  11/03/2016 MRI BRAIN W WO: stable compared to prior MRIs from 02/14/15, 03/08/14 and 07/13/13. 12/02/2019 MRI BRAIN W WO (personally reviewed):  Chronically advanced multiple sclerosis with no areas of active demyelination identified.  However, multifocal posterior frontal and parietal lobe cortical involvement appears mildly progressed since 2018... Evidence of chronic demyelinating plaque in the dorsal upper spinal cord at C2.  He works for [employer], either lifting packages or standing and scanning.  PAST MEDICAL HISTORY: Past Medical History:  Diagnosis Date  . MS (multiple sclerosis) ([place])     MEDICATIONS: Current Outpatient Medications on File Prior to Visit  Medication Sig Dispense Refill  . cholecalciferol (VITAMIN D) 1000 UNITS tablet Take 1,000 Units by mouth daily.    Marland Kitchen LEVITRA 20 MG tablet     . REBIF 44 MCG/0.5ML SOSY injection INJECT ONE SYRINGE SUBCUTANEOUSLY THREE TIMES PER WEEK. KEEP REFRIGERATED. 0.5 mL 2  . tadalafil (CIALIS) 5 MG tablet Take 1 tablet one hour prior to sexual activity.    . tizanidine (ZANAFLEX) 2 MG capsule Take 1 capsule (2 mg total) by mouth 3 (three) times daily as needed for muscle spasms. (Patient not taking: Reported on 12/13/2019) 90 capsule 0   No current facility-administered medications on file prior to visit.    ALLERGIES: No Known Allergies  FAMILY HISTORY: Family History  Problem Relation Age of Onset  . Hypertension Mother   . Hypertension Father   . Diabetes Sister   . Diabetes Brother   . Ataxia Neg Hx   . Chorea Neg Hx   . Dementia Neg Hx   . Mental retardation Neg Hx   . Migraines Neg Hx   . Multiple sclerosis Neg Hx   .  Neurofibromatosis Neg Hx   . Neuropathy Neg Hx   . Parkinsonism Neg Hx   . Seizures Neg Hx   . Stroke Neg Hx   . Early death Neg Hx   . Cancer Neg Hx   . Alcohol abuse Neg Hx   . Hearing loss Neg Hx   . Heart  disease Neg Hx   . Hyperlipidemia Neg Hx   . Kidney disease Neg Hx     SOCIAL HISTORY: Social History   Socioeconomic History  . Marital status: Single    Spouse name: Not on file  . Number of children: 1  . Years of education: Not on file  . Highest education level: Some college, no degree  Occupational History  . Occupation: DRIVER    Employer: [employer]  Tobacco Use  . Smoking status: Never Smoker  . Smokeless tobacco: Never Used  Vaping Use  . Vaping Use: Never used  Substance and Sexual Activity  . Alcohol use: Yes    Alcohol/week: 5.0 standard drinks    Types: 5 Cans of beer per week    Comment: Occ  . Drug use: No  . Sexual activity: Yes    Partners: Female  Other Topics Concern  . Not on file  Social History Narrative   Patient is right-handed. His grown son lives with him in a 2 level home. He uses a home exercise machine 2 x weekly.   Social Determinants of Health   Financial Resource Strain:   . Difficulty of Paying Living Expenses: Not on file  Food Insecurity:   . Worried About Charity fundraiser in the Last Year: Not on file  . Ran Out of Food in the Last Year: Not on file  Transportation Needs:   . Lack of Transportation (Medical): Not on file  . Lack of Transportation (Non-Medical): Not on file  Physical Activity:   . Days of Exercise per Week: Not on file  . Minutes of Exercise per Session: Not on file  Stress:   . Feeling of Stress : Not on file  Social Connections:   . Frequency of Communication with Friends and Family: Not on file  . Frequency of Social Gatherings with Friends and Family: Not on file  . Attends Religious Services: Not on file  . Active Member of Clubs or Organizations: Not on file  . Attends Archivist Meetings: Not on file  . Marital Status: Not on file  Intimate Partner Violence:   . Fear of Current or Ex-Partner: Not on file  . Emotionally Abused: Not on file  . Physically Abused: Not on file  . Sexually Abused:  Not on file     Objective:   There were no vitals filed for this visit. General: No acute distress.  Patient appears well-groomed.   Head:  Normocephalic/atraumatic Eyes:  Fundi examined but not visualized Neck: supple, no paraspinal tenderness, full range of motion Heart:  Regular rate and rhythm Lungs:  Clear to auscultation bilaterally Back: No paraspinal tenderness Neurological Exam: alert and oriented to person, place, and time. Attention span and concentration intact, recent and remote memory intact, fund of knowledge intact.  Speech fluent and not dysarthric, language intact.  CN II-XII intact. Bulk and tone normal, muscle strength 5/5 throughout.  Sensation to light touch, temperature and vibration intact.  Deep tendon reflexes 2+ throughout, toes downgoing.  Finger to nose and heel to shin testing intact.  Gait normal, Romberg negative.   Assessment/Plan:   Multiple  sclerosis  1.  DMT:  Rebif 2.  D3 4000 IU daily 3.  Check CBC with diff, CMP and vit D *** 4.  Repeat MRI of brain with and without contrast in 6 months. 5.  Follow up after repeat labs and MRI in 6 months.  Metta Clines, DO  CC: ***     ]

## 2020-06-14 ENCOUNTER — Ambulatory Visit: Payer: BC Managed Care – PPO | Admitting: Neurology

## 2020-06-20 ENCOUNTER — Encounter: Payer: Self-pay | Admitting: Neurology

## 2020-06-20 NOTE — Progress Notes (Signed)
11/15- received PA forms that needed to be completed and sent back to [facility] on Rebif [facility] for MS. Faxed back same day.  11/- received fax approval valid from 06/19/20 to 06/19/21.

## 2020-06-21 ENCOUNTER — Ambulatory Visit (HOSPITAL_COMMUNITY)
Admission: RE | Admit: 2020-06-21 | Discharge: 2020-06-21 | Disposition: A | Discharge status: Home / Self Care | Payer: BC Managed Care – PPO | Source: Ambulatory Visit | Attending: Urology | Admitting: Urology

## 2020-06-21 ENCOUNTER — Other Ambulatory Visit: Payer: Self-pay

## 2020-06-21 ENCOUNTER — Encounter (HOSPITAL_COMMUNITY)
Admission: RE | Admit: 2020-06-21 | Discharge: 2020-06-21 | Disposition: A | Discharge status: Home / Self Care | Payer: BC Managed Care – PPO | Source: Ambulatory Visit | Attending: Urology | Admitting: Urology

## 2020-06-21 DIAGNOSIS — C61 Malignant neoplasm of prostate: Secondary | ICD-10-CM | POA: Diagnosis present

## 2020-06-21 MED ORDER — TECHNETIUM TC 99M MEDRONATE IV KIT: 20.0000 | PACK | Freq: Once | INTRAVENOUS | Status: DC | PRN

## 2020-06-21 MED ORDER — FLUDEOXYGLUCOSE F - 18 (FDG) INJECTION: 20.0000 | Freq: Once | INTRAVENOUS | Status: DC | PRN

## 2020-06-26 NOTE — Progress Notes (Signed)
NEUROLOGY FOLLOW UP OFFICE NOTE  SHANE BADEAUX [id number]   Subjective:  Devon Barron is a 59year old right-handed man who follows up for multiple sclerosis  UPDATE: Current disease modifying therapy: Rebif Other current medications: D3 4000 IU daily  He has been diagnosed with prostate cancer but it was caught early.  He feels stressed because his father passed away from prostate cancer.  Physically, he overall has been feeling fine.  Sometimes fatigued. Vision:No issues Motor:No issues Sensory:No issues Pain: No issues Gait:No issues Bowel/Bladder:No issues Fatigue:Some Cognition:No issues Mood:Denies depression or anxiety  HISTORY: He was diagnosed with MS in 2001.At that time, he presented with left facial myokymia. He reports that he hasn't had many flare ups, and only a couple of flare-ups requiring pulse steroids.They typically present as blurred vision, headache, dizziness, unsteadiness and fatigue.He has both supratentorial and infratentorial demyelinating lesions.His cervical cord is okay.He has a history of lapses with physician follow-ups and non-adherence to disease-modifying agents, mostly due to financial reasons.He previously was on Avonex, and then Rebif.Due to financial reasons, he has not followed up with a neurologist in between 2013 and 2014 and had not taken his Rebif.He developed increased fatigue, stiffness, blurred vision and intermittent headaches, similar to prior flare ups.He presented to the ED on 05/03/13.Physical exam revealed slight right sided facial droop.Initially, a CT of the head was performed and revealed decreased attenuation at the right subfrontal-anterior temporal junction.MRI Brain w/wo contrast was performed, and revealed extensive periventricular and subcortical T2 hyperintensities, as well as focal restricted diffusion within the splenium of the corpus callosum, concerning for active demyelination.He has  since restarted Rebif.  MRI of brain with and without contrast from 11/03/16 was stable compared to prior MRIs from 02/14/15, 03/08/14 and 07/13/13.  MRI of brain with and without contrast from 12/02/19 showed mildly progressed multifocal posterior frontal and parietal lobe cortical involvement appears mildly progressed since 2018.  Evidence of chronic plaque in dorsal upper spinal cord at C2.  He works for [employer], either lifting packages or standing and scanning.  PAST MEDICAL HISTORY: Past Medical History:  Diagnosis Date  . MS (multiple sclerosis) ([place])     MEDICATIONS: Current Outpatient Medications on File Prior to Visit  Medication Sig Dispense Refill  . cholecalciferol (VITAMIN D) 1000 UNITS tablet Take 1,000 Units by mouth daily.    Marland Kitchen LEVITRA 20 MG tablet     . REBIF 44 MCG/0.5ML SOSY injection INJECT ONE SYRINGE SUBCUTANEOUSLY THREE TIMES PER WEEK. KEEP REFRIGERATED. 0.5 mL 2  . tadalafil (CIALIS) 5 MG tablet Take 1 tablet one hour prior to sexual activity.    . tizanidine (ZANAFLEX) 2 MG capsule Take 1 capsule (2 mg total) by mouth 3 (three) times daily as needed for muscle spasms. (Patient not taking: Reported on 12/13/2019) 90 capsule 0   Current Facility-Administered Medications on File Prior to Visit  Medication Dose Route Frequency Provider Last Rate Last Admin  . technetium medronate (TC-MDP) injection 20 millicurie  20 millicurie Intravenous Once PRN Felipa Emory, MD        ALLERGIES: No Known Allergies  FAMILY HISTORY: Family History  Problem Relation Age of Onset  . Hypertension Mother   . Hypertension Father   . Diabetes Sister   . Diabetes Brother   . Ataxia Neg Hx   . Chorea Neg Hx   . Dementia Neg Hx   . Mental retardation Neg Hx   . Migraines Neg Hx   . Multiple sclerosis Neg Hx   .  Neurofibromatosis Neg Hx   . Neuropathy Neg Hx   . Parkinsonism Neg Hx   . Seizures Neg Hx   . Stroke Neg Hx   . Early death Neg Hx   . Cancer Neg Hx   . Alcohol abuse  Neg Hx   . Hearing loss Neg Hx   . Heart disease Neg Hx   . Hyperlipidemia Neg Hx   . Kidney disease Neg Hx     SOCIAL HISTORY: Social History   Socioeconomic History  . Marital status: Single    Spouse name: Not on file  . Number of children: 1  . Years of education: Not on file  . Highest education level: Some college, no degree  Occupational History  . Occupation: DRIVER    Employer: [employer]  Tobacco Use  . Smoking status: Never Smoker  . Smokeless tobacco: Never Used  Vaping Use  . Vaping Use: Never used  Substance and Sexual Activity  . Alcohol use: Yes    Alcohol/week: 5.0 standard drinks    Types: 5 Cans of beer per week    Comment: Occ  . Drug use: No  . Sexual activity: Yes    Partners: Female  Other Topics Concern  . Not on file  Social History Narrative   Patient is right-handed. His grown son lives with him in a 2 level home. He uses a home exercise machine 2 x weekly.   Social Determinants of Health   Financial Resource Strain:   . Difficulty of Paying Living Expenses: Not on file  Food Insecurity:   . Worried About Charity fundraiser in the Last Year: Not on file  . Ran Out of Food in the Last Year: Not on file  Transportation Needs:   . Lack of Transportation (Medical): Not on file  . Lack of Transportation (Non-Medical): Not on file  Physical Activity:   . Days of Exercise per Week: Not on file  . Minutes of Exercise per Session: Not on file  Stress:   . Feeling of Stress : Not on file  Social Connections:   . Frequency of Communication with Friends and Family: Not on file  . Frequency of Social Gatherings with Friends and Family: Not on file  . Attends Religious Services: Not on file  . Active Member of Clubs or Organizations: Not on file  . Attends Archivist Meetings: Not on file  . Marital Status: Not on file  Intimate Partner Violence:   . Fear of Current or Ex-Partner: Not on file  . Emotionally Abused: Not on file  .  Physically Abused: Not on file  . Sexually Abused: Not on file     Objective:  Blood pressure (!) 162/110, pulse 82, resp. rate 20, height 5\' 9"  (1.753 m), weight 215 lb (97.5 kg), SpO2 97 %. General: No acute distress.  Patient appears well-groomed.   Head:  Normocephalic/atraumatic Eyes:  Fundi examined but not visualized Neck: supple, no paraspinal tenderness, full range of motion Heart:  Regular rate and rhythm Lungs:  Clear to auscultation bilaterally Back: No paraspinal tenderness Neurological Exam: alert and oriented to person, place, and time. Attention span and concentration intact, recent and remote memory intact, fund of knowledge intact.  Speech fluent and not dysarthric, language intact.  CN II-XII intact. Bulk and tone normal, muscle strength 5/5 throughout.  Sensation to light touch, temperature and vibration intact.  Deep tendon reflexes 2+ throughout, toes downgoing.  Finger to nose and heel to shin  testing intact.  Gait normal, Romberg negative.   Assessment/Plan:   Multiple sclerosis  1. DMT:  Rebiff 2.  D3 4000 IU daily 3. Check CBC with diff, CMP and vit D today and again in 6 months 4.  Repeat MRI of brain with and without contrast in 6 months 5.  Follow up in 6 months (after repeat labs and MRI)   Metta Clines, DO  CC: Scarlette Calico, MD

## 2020-06-28 ENCOUNTER — Ambulatory Visit (INDEPENDENT_AMBULATORY_CARE_PROVIDER_SITE_OTHER): Payer: BC Managed Care – PPO | Admitting: Neurology

## 2020-06-28 ENCOUNTER — Other Ambulatory Visit (INDEPENDENT_AMBULATORY_CARE_PROVIDER_SITE_OTHER): Payer: BC Managed Care – PPO

## 2020-06-28 ENCOUNTER — Other Ambulatory Visit: Payer: Self-pay

## 2020-06-28 ENCOUNTER — Encounter: Payer: Self-pay | Admitting: Neurology

## 2020-06-28 VITALS — BP 162/110 | HR 82 | Resp 20 | Ht 69.0 in | Wt 215.0 lb

## 2020-06-28 DIAGNOSIS — G35 Multiple sclerosis: Secondary | ICD-10-CM

## 2020-06-28 LAB — COMPREHENSIVE METABOLIC PANEL
ALT: 23 U/L (ref 0–53)
AST: 20 U/L (ref 0–37)
Albumin: 4.5 g/dL (ref 3.5–5.2)
Alkaline Phosphatase: 57 U/L (ref 39–117)
BUN: 9 mg/dL (ref 6–23)
CO2: 28 mEq/L (ref 19–32)
Calcium: 9.6 mg/dL (ref 8.4–10.5)
Chloride: 101 mEq/L (ref 96–112)
Creatinine, Ser: 1.06 mg/dL (ref 0.40–1.50)
GFR: 76.93 mL/min (ref >60.00)
Glucose, Bld: 110 mg/dL — ABNORMAL HIGH (ref 70–99)
Potassium: 4 mEq/L (ref 3.5–5.1)
Sodium: 138 mEq/L (ref 135–145)
Total Bilirubin: 0.4 mg/dL (ref 0.2–1.2)
Total Protein: 8.2 g/dL (ref 6.0–8.3)

## 2020-06-28 LAB — CBC WITH DIFFERENTIAL/PLATELET
Basophils Absolute: 0.1 10*3/uL (ref 0.0–0.1)
Basophils Relative: 1.1 % (ref 0.0–3.0)
Eosinophils Absolute: 0.2 10*3/uL (ref 0.0–0.7)
Eosinophils Relative: 2.8 % (ref 0.0–5.0)
HCT: 47.3 % (ref 39.0–52.0)
Hemoglobin: 15.5 g/dL (ref 13.0–17.0)
Lymphocytes Relative: 23 % (ref 12.0–46.0)
Lymphs Abs: 1.3 10*3/uL (ref 0.7–4.0)
MCHC: 32.7 g/dL (ref 30.0–36.0)
MCV: 86.8 fl (ref 78.0–100.0)
Monocytes Absolute: 0.7 10*3/uL (ref 0.1–1.0)
Monocytes Relative: 13.1 % — ABNORMAL HIGH (ref 3.0–12.0)
Neutro Abs: 3.4 10*3/uL (ref 1.4–7.7)
Neutrophils Relative %: 60 % (ref 43.0–77.0)
Platelets: 228 10*3/uL (ref 150.0–400.0)
RBC: 5.44 Mil/uL (ref 4.22–5.81)
RDW: 14.2 % (ref 11.5–15.5)
WBC: 5.6 10*3/uL (ref 4.0–10.5)

## 2020-06-28 LAB — VITAMIN D 25 HYDROXY (VIT D DEFICIENCY, FRACTURES): VITD: 47.38 ng/mL (ref 30.00–100.00)

## 2020-06-28 NOTE — Patient Instructions (Signed)
1.  Continue Rebif 2.  Continue D3 4000 IU daily 3.  Check CBC with diff, CMP and vit D today and again in 6 months 4.  Repeat MRI of brain with and without contrast in 6 months 5.  Follow up in 6 months (after repeat labs and MRI)

## 2020-06-28 NOTE — Progress Notes (Signed)
MRI of Brain edited to say 6 months excepted.

## 2020-08-23 NOTE — Telephone Encounter (Signed)
Yes, I can refill them

## 2020-08-24 ENCOUNTER — Other Ambulatory Visit: Payer: Self-pay

## 2020-08-24 MED ORDER — REBIF 44 MCG/0.5ML ~~LOC~~ SOSY
PREFILLED_SYRINGE | SUBCUTANEOUS | 2 refills | Status: DC
Start: 2020-08-24 — End: 2021-02-19

## 2020-08-24 NOTE — Progress Notes (Signed)
Message left by [facility] please send new script for Rebiff.  Script sent pt al ready aware he needs to schedule a f/u visit.

## 2020-08-29 NOTE — Progress Notes (Signed)
FMLA forms filled and faxed per pt request

## 2020-09-12 ENCOUNTER — Encounter: Payer: Self-pay | Admitting: Internal Medicine

## 2020-10-02 ENCOUNTER — Encounter: Payer: Self-pay | Admitting: Radiation Oncology

## 2020-10-02 NOTE — Progress Notes (Signed)
GU Location of Tumor / Histology: prostatic adenocarcinoma  If Prostate Cancer, Gleason Score is (4 + 4) and PSA is (13.9). Prostate volume: 26 g  Devon Barron presented with an elevated PSA. Patient reports his father passed away from prostate cancer 12 years ago.  Biopsies of prostate (if applicable) revealed:   Past/Anticipated interventions by urology, if any: prostate biopsy, CT (negative), Bone scan (negative), referral to Dr. Tammi Klippel for consideration of radiation therapy  Past/Anticipated interventions by medical oncology, if any: no  Weight changes, if any: no  Bowel/Bladder complaints, if any: IPSS 4. SHIM 7. No sexually active at this time.  Denies dysuria or hematuria. Denies urinary leakage or incontinence. Denies any bowel complaints  Nausea/Vomiting, if any: denies  Pain issues, if any:  Reports intermittent pain in the head of his penis  SAFETY ISSUES:  Prior radiation? denies  Pacemaker/ICD? no  Possible current pregnancy? no, male patient  Is the patient on methotrexate? denies  Current Complaints / other details:  60 year old male. Single. One son. Works as a Geophysicist/field seismologist. Resides in [place]. Father with hx of prostate cancer. Mother with hx of ovarian cancer.

## 2020-10-03 ENCOUNTER — Encounter: Payer: Self-pay | Admitting: Licensed Clinical Social Worker

## 2020-10-03 ENCOUNTER — Ambulatory Visit
Admission: RE | Admit: 2020-10-03 | Discharge: 2020-10-03 | Disposition: A | Discharge status: Home / Self Care | Payer: BC Managed Care – PPO | Source: Ambulatory Visit | Attending: Radiation Oncology | Admitting: Radiation Oncology

## 2020-10-03 ENCOUNTER — Encounter: Payer: Self-pay | Admitting: Medical Oncology

## 2020-10-03 ENCOUNTER — Encounter: Payer: Self-pay | Admitting: Radiation Oncology

## 2020-10-03 ENCOUNTER — Other Ambulatory Visit: Payer: Self-pay

## 2020-10-03 VITALS — BP 146/99 | HR 82 | Temp 96.8°F | Resp 18 | Ht 69.0 in | Wt 212.0 lb

## 2020-10-03 DIAGNOSIS — G35 Multiple sclerosis: Secondary | ICD-10-CM | POA: Diagnosis not present

## 2020-10-03 DIAGNOSIS — C61 Malignant neoplasm of prostate: Secondary | ICD-10-CM

## 2020-10-03 DIAGNOSIS — Z8041 Family history of malignant neoplasm of ovary: Secondary | ICD-10-CM | POA: Diagnosis not present

## 2020-10-03 HISTORY — DX: Malignant neoplasm of prostate: C61

## 2020-10-03 NOTE — Progress Notes (Signed)
Radiation Oncology         [phone] ________________________________  Initial outpatient Consultation  Name: Devon Barron MRN: [id number]  Date: 10/03/2020  DOB: 03/01/1961  JM:EQAST, Arvid Right, MD  Devon Hughs, MD   REFERRING PHYSICIAN: Ardis Hughs, MD  DIAGNOSIS: 60 y.o. gentleman with Stage T1c adenocarcinoma of the prostate with Gleason score of 4+4, and PSA of 13.9.    ICD-10-CM   1. Malignant neoplasm of prostate [facility] [place])  [place] Ambulatory referral to Social Work    HISTORY OF PRESENT ILLNESS: Devon Barron is a 60 y.o. male with a diagnosis of prostate cancer. He was noted to have an elevated PSA of 14.7 by his primary care physician, Dr. Ronnald Barron. This was increased from 5.0 in 2018. Accordingly, he was referred for evaluation in urology by Dr. Smitty Barron on 01/25/2020,  digital rectal examination was performed at that time revealing no nodules. He had a repeat PSA in 03/2020 which remained elevated at 13.9. Therefore, the patient proceeded to transrectal ultrasound with 12 biopsies of the prostate on 05/09/2020.  The prostate volume measured 26 cc.  Out of 12 core biopsies, 6 were positive.  The maximum Gleason score was 4+4, and this was seen in the left base lateral, left mid lateral, left apex lateral, and right base. Additionally, Gleason 4+3 was seen in the left mid, and Gleason 3+3 in the right mid.  He underwent staging CT A/P and bone scan on 06/21/2020. Both were negative for metastatic disease.   The patient reviewed the biopsy results with his urologist and  initially elected to enroll in the PROTEUS trial and received his first 28-monthEligard injection for ADT on 08/08/2020. However, after giving further thought and doing some further research, he decided that he was not interested in surgery and instead, wanted to learn more about his radiation options.  Therefore, he has kindly been referred today for discussion of potential radiation treatment options for his  high risk prostate cancer.   PREVIOUS RADIATION THERAPY: No  PAST MEDICAL HISTORY:  Past Medical History:  Diagnosis Date  . [place]   . [place]       PAST SURGICAL HISTORY: Past Surgical History:  Procedure Laterality Date  . HERNIA REPAIR    . PROSTATE BIOPSY      FAMILY HISTORY:  Family History  Problem Relation Age of Onset  . Hypertension Mother   . Ovarian cancer Mother   . Hypertension Father   . Prostate cancer Father   . Diabetes Sister   . Diabetes Brother   . Ataxia Neg Hx   . Chorea Neg Hx   . Dementia Neg Hx   . Mental retardation Neg Hx   . Migraines Neg Hx   . Multiple sclerosis Neg Hx   . Neurofibromatosis Neg Hx   . Neuropathy Neg Hx   . Parkinsonism Neg Hx   . Seizures Neg Hx   . Stroke Neg Hx   . Early death Neg Hx   . Cancer Neg Hx   . Alcohol abuse Neg Hx   . Hearing loss Neg Hx   . Heart disease Neg Hx   . Hyperlipidemia Neg Hx   . Kidney disease Neg Hx   . Breast cancer Neg Hx   . Colon cancer Neg Hx   . Pancreatic cancer Neg Hx     SOCIAL HISTORY:  Social History   Socioeconomic History  . Marital status: Single  Spouse name: Not on file  . Number of children: 1  . Years of education: Not on file  . Highest education level: Some college, no degree  Occupational History  . Occupation: DRIVER    Employer: [employer]  Tobacco Use  . Smoking status: Never Smoker  . Smokeless tobacco: Never Used  Vaping Use  . Vaping Use: Never used  Substance and Sexual Activity  . Alcohol use: Yes    Alcohol/week: 5.0 standard drinks    Types: 5 Cans of beer per week    Comment: Occ  . Drug use: No  . Sexual activity: Yes    Partners: Female  Other Topics Concern  . Not on file  Social History Narrative   Patient is right-handed. His grown son lives with him in a 2 level home. He uses a home exercise machine 2 x weekly.   Social Determinants of Health   Financial Resource Strain: Not on file  Food  Insecurity: No Food Insecurity  . Worried About Charity fundraiser in the Last Year: Never true  . Ran Out of Food in the Last Year: Never true  Transportation Needs: No Transportation Needs  . Lack of Transportation (Medical): No  . Lack of Transportation (Non-Medical): No  Physical Activity: Not on file  Stress: Not on file  Social Connections: Not on file  Intimate Partner Violence: Not on file    ALLERGIES: Patient has no known allergies.  MEDICATIONS:  Current Outpatient Medications  Medication Sig Dispense Refill  . cholecalciferol (VITAMIN D) 1000 UNITS tablet Take 1,000 Units by mouth daily.    . interferon beta-1a (REBIF) 44 MCG/0.5ML SOSY injection INJECT ONE SYRINGE SUBCUTANEOUSLY THREE TIMES PER WEEK. KEEP REFRIGERATED. 0.5 mL 2   No current facility-administered medications for this encounter.    REVIEW OF SYSTEMS:  On review of systems, the patient reports that he is doing well overall. He denies any chest pain, shortness of breath, cough, fevers, chills, night sweats, unintended weight changes. He denies any bowel disturbances, and denies abdominal pain, nausea or vomiting. He denies any new musculoskeletal or joint aches or pains. His IPSS was 4, indicating mild urinary symptoms. His SHIM was 7, indicating he has severe erectile dysfunction but notes that he is not currently sexually active. A complete review of systems is obtained and is otherwise negative.    PHYSICAL EXAM:  Wt Readings from Last 3 Encounters:  10/03/20 212 lb (96.2 kg)  06/28/20 215 lb (97.5 kg)  12/13/19 218 lb 9.6 oz (99.2 kg)   Temp Readings from Last 3 Encounters:  10/03/20 (!) 96.8 F (36 C) (Temporal)  03/31/18 98.2 F (36.8 C) (Oral)  01/27/17 98.7 F (37.1 C) (Oral)   BP Readings from Last 3 Encounters:  10/03/20 (!) 146/99  06/28/20 (!) 162/110  12/13/19 (!) 144/82   Pulse Readings from Last 3 Encounters:  10/03/20 82  06/28/20 82  12/13/19 98   Pain Assessment Pain  Score: 0-No pain/10  In general this is a well appearing African-American male in no acute distress. He's alert and oriented x4 and appropriate throughout the examination. Cardiopulmonary assessment is negative for acute distress, and he exhibits normal effort.     KPS = 100  100 - Normal; no complaints; no evidence of disease. 90   - Able to carry on normal activity; minor signs or symptoms of disease. 80   - Normal activity with effort; some signs or symptoms of disease. 22   - Cares for  self; unable to carry on normal activity or to do active work. 60   - Requires occasional assistance, but is able to care for most of his personal needs. 50   - Requires considerable assistance and frequent medical care. 37   - Disabled; requires special care and assistance. 70   - Severely disabled; hospital admission is indicated although death not imminent. 58   - Very sick; hospital admission necessary; active supportive treatment necessary. 10   - Moribund; fatal processes progressing rapidly. 0     - Dead  Karnofsky DA, Abelmann Barnes, Craver LS and Burchenal [facility] [phone]) The use of the nitrogen mustards in the palliative treatment of carcinoma: with particular reference to bronchogenic carcinoma Cancer 1 [phone]  LABORATORY DATA:  Lab Results  Component Value Date   WBC 5.6 06/28/2020   HGB 15.5 06/28/2020   HCT 47.3 06/28/2020   MCV 86.8 06/28/2020   PLT 228.0 06/28/2020   Lab Results  Component Value Date   NA 138 06/28/2020   K 4.0 06/28/2020   CL 101 06/28/2020   CO2 28 06/28/2020   Lab Results  Component Value Date   ALT 23 06/28/2020   AST 20 06/28/2020   ALKPHOS 57 06/28/2020   BILITOT 0.4 06/28/2020     RADIOGRAPHY: No results found.    IMPRESSION/PLAN: 1. 60 y.o. gentleman with Stage T1c adenocarcinoma of the prostate with Gleason Score of 4+4, and PSA of 13.9. We discussed the patient's workup and outlined the nature of prostate cancer in this setting. The patient's T  stage, Gleason's score, and PSA put him into the high risk group. Accordingly, he is eligible for a variety of potential treatment options including prostatectomy (on or off PROTEUS trial) or LT-ADT in combination with either 8 weeks of external radiation for 5 weeks of external radiation with an upfront brachytherapy boost. We discussed the available radiation techniques, and focused on the details and logistics of delivery. We discussed and outlined the risks, benefits, short and long-term effects associated with radiotherapy and compared and contrasted these with prostatectomy. We discussed the role of SpaceOAR gel in reducing the rectal toxicity associated with radiotherapy. We also detailed the role of ADT in the treatment of high risk prostate cancer and outlined the associated side effects that could be expected with this therapy. He was encouraged to ask questions that were answered to his stated satisfaction.  At the conclusion of our conversation, the patient is interested in moving forward with LT-ADT concurrent with brachytherapy boost and use of SpaceOAR gel followed by a 5 week course of daily radiotherapy.  He received his first 29-monthLupron injection on 08/08/2020. We will share our discussion with Dr. HLouis Meckeland move forward with treatment planning accordingly.  The patient met briefly with SRomie Jumperin our office who will be working closely with him to coordinate OR scheduling and pre and post procedure appointments.  We will contact the pharmaceutical rep to ensure that [place] at the time of procedure.  He appears to have a good understanding of his disease and our treatment recommendations which are of curative intent and he is in agreement with the stated plan.    ANicholos Johns PA-C    MTyler Pita MD  [facility] Direct Dial: [phone] Fax: [phone]  Skype  LinkedIn   This document serves as a record of  services personally performed by MTyler Pita MD and AFreeman Caldron PA-C. It was created  on their behalf by Wilburn Mylar, a trained medical scribe. The creation of this record is based on the scribe's personal observations and the provider's statements to them. This document has been checked and approved by the attending provider.

## 2020-10-03 NOTE — Progress Notes (Signed)
Introduced myself to patient as the prostate nurse navigator and discussed my role. He has a family history of prostate cancer, including his father who passed away from it. He started the PROTEUS Trial at [facility]  back in January. After much thought,he was uneasy  not knowing if he was actually receiving medication or placebo. He wit He received ADT in January and here today to discuss his radiation options. After consulting with Ashlyn, PA and Dr. Tammi Klippel, he has chosen to get a seed boost followed by 5.5 weeks of radiation. No barriers to care at this time. I gave him my business card and asked him to call me with question or concerns. He voiced understanding.

## 2020-10-03 NOTE — Progress Notes (Signed)
[facility] Work  Initial Assessment   Devon Barron is a 60 y.o. year old male contacted by phone. Clinical Social Work was referred by distress screen for assessment of psychosocial needs.   SDOH (Social Determinants of Health) assessments performed: Yes SDOH Interventions   Flowsheet Row Most Recent Value  SDOH Interventions   Food Insecurity Interventions Intervention Not Indicated  Transportation Interventions Intervention Not Indicated      Distress Screen completed: Yes ONCBCN DISTRESS SCREENING 10/03/2020  Distress experienced in past week (1-10) 5  Practical problem type Work/school  Emotional problem type Nervousness/Anxiety;Adjusting to illness;Feeling hopeless  Spiritual/Religous concerns type Facing my mortality  Information Concerns Type Lack of info about treatment  Physician notified of physical symptoms Yes  Referral to clinical psychology No  Referral to clinical social work No  Referral to dietition No  Referral to financial advocate No  Referral to support programs Yes  Referral to palliative care No      Family/Social Information:  . Housing Arrangement: patient lives with adult son.(son is not aware of diagnosis at this time)  Has significant other/ girlfriend . Family members/support persons in your life? Girlfriend, friends, coworkers . Transportation concerns: no  . Employment: Working full time for [employer]. Income source: Employment . Financial concerns: No o Type of concern: None . Food access concerns: no . Services Currently in place:  n/a  Coping/ Adjustment to diagnosis: . Patient understands treatment plan and what happens next? yes, has explored options and feels good about his current choice for radiation.  . Concerns about diagnosis and/or treatment: General adjustment. Scared at first as his dad died in his arms from prostate cancer in 2009 . Patient reported stressors: Adjusting to my illness . Hopes and priorities: hopes to be  cured and continue on with his life . Current coping skills/ strengths: Capable of independent living, Motivation for treatment/growth and Supportive family/friends    SUMMARY: Current SDOH Barriers:  . No significant SDOH barriers at this time  Clinical Social Work Clinical Goal(s):  Marland Kitchen Patient will attend support group for continued support  Interventions: . Discussed common feeling and emotions when being diagnosed with cancer, and the importance of support during treatment . Informed patient of the support team roles and support services at [facility] . Provided CSW contact information and encouraged patient to call with any questions or concerns . Added patient to prostate cancer support group   Follow Up Plan: Patient will contact CSW with any support or resource needs Patient verbalizes understanding of plan: Yes    Christeen Douglas , LCSW

## 2020-10-05 ENCOUNTER — Other Ambulatory Visit: Payer: Self-pay

## 2020-10-05 ENCOUNTER — Ambulatory Visit: Payer: BC Managed Care – PPO | Admitting: Internal Medicine

## 2020-10-05 ENCOUNTER — Encounter: Payer: Self-pay | Admitting: Internal Medicine

## 2020-10-05 VITALS — BP 148/102 | HR 89 | Temp 98.7°F | Resp 16 | Ht 69.0 in | Wt 213.0 lb

## 2020-10-05 DIAGNOSIS — Z0000 Encounter for general adult medical examination without abnormal findings: Secondary | ICD-10-CM

## 2020-10-05 DIAGNOSIS — I10 Essential (primary) hypertension: Secondary | ICD-10-CM

## 2020-10-05 DIAGNOSIS — C61 Malignant neoplasm of prostate: Secondary | ICD-10-CM | POA: Diagnosis not present

## 2020-10-05 DIAGNOSIS — R7303 Prediabetes: Secondary | ICD-10-CM

## 2020-10-05 DIAGNOSIS — R9431 Abnormal electrocardiogram [ECG] [EKG]: Secondary | ICD-10-CM

## 2020-10-05 DIAGNOSIS — E785 Hyperlipidemia, unspecified: Secondary | ICD-10-CM

## 2020-10-05 DIAGNOSIS — E118 Type 2 diabetes mellitus with unspecified complications: Secondary | ICD-10-CM

## 2020-10-05 LAB — LIPID PANEL
Cholesterol: 199 mg/dL (ref 0–200)
HDL: 51.2 mg/dL (ref >39.00)
LDL Cholesterol: 114 mg/dL — ABNORMAL HIGH (ref 0–99)
NonHDL: 148.22
Total CHOL/HDL Ratio: 4
Triglycerides: 170 mg/dL — ABNORMAL HIGH (ref 0.0–149.0)
VLDL: 34 mg/dL (ref 0.0–40.0)

## 2020-10-05 LAB — CBC WITH DIFFERENTIAL/PLATELET
Basophils Absolute: 0.1 10*3/uL (ref 0.0–0.1)
Basophils Relative: 1.1 % (ref 0.0–3.0)
Eosinophils Absolute: 0.2 10*3/uL (ref 0.0–0.7)
Eosinophils Relative: 2.6 % (ref 0.0–5.0)
HCT: 42.3 % (ref 39.0–52.0)
Hemoglobin: 14.1 g/dL (ref 13.0–17.0)
Lymphocytes Relative: 17.6 % (ref 12.0–46.0)
Lymphs Abs: 1.1 10*3/uL (ref 0.7–4.0)
MCHC: 33.3 g/dL (ref 30.0–36.0)
MCV: 86 fl (ref 78.0–100.0)
Monocytes Absolute: 0.8 10*3/uL (ref 0.1–1.0)
Monocytes Relative: 13.1 % — ABNORMAL HIGH (ref 3.0–12.0)
Neutro Abs: 4.2 10*3/uL (ref 1.4–7.7)
Neutrophils Relative %: 65.6 % (ref 43.0–77.0)
Platelets: 210 10*3/uL (ref 150.0–400.0)
RBC: 4.92 Mil/uL (ref 4.22–5.81)
RDW: 13.6 % (ref 11.5–15.5)
WBC: 6.5 10*3/uL (ref 4.0–10.5)

## 2020-10-05 LAB — BASIC METABOLIC PANEL
BUN: 10 mg/dL (ref 6–23)
CO2: 31 mEq/L (ref 19–32)
Calcium: 10 mg/dL (ref 8.4–10.5)
Chloride: 101 mEq/L (ref 96–112)
Creatinine, Ser: 0.87 mg/dL (ref 0.40–1.50)
GFR: 94.4 mL/min (ref >60.00)
Glucose, Bld: 109 mg/dL — ABNORMAL HIGH (ref 70–99)
Potassium: 4.3 mEq/L (ref 3.5–5.1)
Sodium: 138 mEq/L (ref 135–145)

## 2020-10-05 LAB — URINALYSIS, ROUTINE W REFLEX MICROSCOPIC
Bilirubin Urine: NEGATIVE
Hgb urine dipstick: NEGATIVE
Ketones, ur: NEGATIVE
Leukocytes,Ua: NEGATIVE
Nitrite: NEGATIVE
RBC / HPF: NONE SEEN (ref 0–?)
Specific Gravity, Urine: >=1.03 — AB (ref 1.000–1.030)
Total Protein, Urine: NEGATIVE
Urine Glucose: NEGATIVE
Urobilinogen, UA: 0.2 (ref 0.0–1.0)
pH: 6 (ref 5.0–8.0)

## 2020-10-05 LAB — VITAMIN D 25 HYDROXY (VIT D DEFICIENCY, FRACTURES): VITD: 43.5 ng/mL (ref 30.00–100.00)

## 2020-10-05 LAB — HEMOGLOBIN A1C: Hgb A1c MFr Bld: 6.7 % — ABNORMAL HIGH (ref 4.6–6.5)

## 2020-10-05 LAB — TSH: TSH: 0.84 u[IU]/mL (ref 0.35–4.50)

## 2020-10-05 MED ORDER — SYNJARDY 5-500 MG PO TABS: 1.0000 | ORAL_TABLET | Freq: Two times a day (BID) | ORAL | 0 refills | Status: DC

## 2020-10-05 MED ORDER — ROSUVASTATIN CALCIUM 10 MG PO TABS: 10.0000 mg | ORAL_TABLET | Freq: Every day | ORAL | 1 refills | Status: DC

## 2020-10-05 MED ORDER — INDAPAMIDE 1.25 MG PO TABS: 1.2500 mg | ORAL_TABLET | Freq: Every day | ORAL | 0 refills | Status: DC

## 2020-10-05 NOTE — Progress Notes (Signed)
Subjective:  Patient ID: Devon Barron, male    DOB: 05/28/1961  Age: 60 y.o. MRN: [id number]  CC: Annual Exam and Hypertension  This visit occurred during the SARS-CoV-2 public health emergency.  Safety protocols were in place, including screening questions prior to the visit, additional usage of staff PPE, and extensive cleaning of exam room while observing appropriate contact time as indicated for disinfecting solutions.    HPI Devon Barron presents for a CPX.  Since I last saw him he has been diagnosed with prostate cancer.  He is seeing urology and considering his treatment options.  In recent visits elsewhere he has been told that his blood pressure is high.  He is active and denies any recent episodes of chest pain, shortness of breath, palpitations, edema, or fatigue.  He complains of weight gain.  He denies polys.   Outpatient Medications Prior to Visit  Medication Sig Dispense Refill  . cholecalciferol (VITAMIN D) 1000 UNITS tablet Take 1,000 Units by mouth daily.    . interferon beta-1a (REBIF) 44 MCG/0.5ML SOSY injection INJECT ONE SYRINGE SUBCUTANEOUSLY THREE TIMES PER WEEK. KEEP REFRIGERATED. 0.5 mL 2   No facility-administered medications prior to visit.    ROS Review of Systems  Constitutional: Positive for unexpected weight change. Negative for appetite change, chills and fatigue.  HENT: Negative.   Eyes: Negative.   Respiratory: Negative for chest tightness and shortness of breath.   Cardiovascular: Negative for chest pain, palpitations and leg swelling.  Gastrointestinal: Negative for abdominal pain, constipation, diarrhea, nausea and vomiting.  Endocrine: Negative.   Genitourinary: Negative.  Negative for difficulty urinating.  Musculoskeletal: Negative.  Negative for arthralgias and myalgias.  Skin: Negative.  Negative for color change and pallor.  Neurological: Negative.  Negative for dizziness, weakness, light-headedness, numbness and headaches.   Hematological: Negative for adenopathy. Does not bruise/bleed easily.  Psychiatric/Behavioral: Negative.     Objective:  BP (!) 148/102   Pulse 89   Temp 98.7 F (37.1 C) (Oral)   Resp 16   Ht 5\' 9"  (1.753 m)   Wt 213 lb (96.6 kg)   SpO2 98%   BMI 31.45 kg/m   BP Readings from Last 3 Encounters:  10/05/20 (!) 148/102  10/03/20 (!) 146/99  06/28/20 (!) 162/110    Wt Readings from Last 3 Encounters:  10/05/20 213 lb (96.6 kg)  10/03/20 212 lb (96.2 kg)  06/28/20 215 lb (97.5 kg)    Physical Exam Vitals reviewed.  Constitutional:      Appearance: Normal appearance.  HENT:     Nose: Nose normal.     Mouth/Throat:     Mouth: Mucous membranes are moist.  Eyes:     General: No scleral icterus.    Conjunctiva/sclera: Conjunctivae normal.  Cardiovascular:     Rate and Rhythm: Normal rate and regular rhythm.     Heart sounds: No murmur heard.     Comments: EKG- NSR, 90 bpm Down-slopping ST segs in I and II Q in III Flat/inverted T waves in V1-V6. Subtle changes compared to the prior EKG  Pulmonary:     Effort: Pulmonary effort is normal.     Breath sounds: No stridor. No wheezing, rhonchi or rales.  Abdominal:     General: Abdomen is flat.     Palpations: There is no mass.     Tenderness: There is no abdominal tenderness. There is no guarding.     Hernia: No hernia is present.  Musculoskeletal:  Cervical back: Neck supple.     Right lower leg: No edema.     Left lower leg: No edema.  Lymphadenopathy:     Cervical: No cervical adenopathy.  Skin:    General: Skin is warm and dry.  Neurological:     General: No focal deficit present.     Mental Status: He is alert.  Psychiatric:        Mood and Affect: Mood normal.        Behavior: Behavior normal.     Lab Results  Component Value Date   WBC 6.5 10/05/2020   HGB 14.1 10/05/2020   HCT 42.3 10/05/2020   PLT 210.0 10/05/2020   GLUCOSE 109 (H) 10/05/2020   CHOL 199 10/05/2020   TRIG 170.0 (H)  10/05/2020   HDL 51.20 10/05/2020   LDLDIRECT 130.0 12/28/2015   LDLCALC 114 (H) 10/05/2020   ALT 23 06/28/2020   AST 20 06/28/2020   NA 138 10/05/2020   K 4.3 10/05/2020   CL 101 10/05/2020   CREATININE 0.87 10/05/2020   BUN 10 10/05/2020   CO2 31 10/05/2020   TSH 0.84 10/05/2020   PSA 13.90 03/14/2020   HGBA1C 6.7 (H) 10/05/2020   MICROALBUR 2.4 (H) 03/31/2018    No results found.  Assessment & Plan:   [name] was seen today for annual exam and hypertension.  Diagnoses and all orders for this visit:  Essential hypertension- His blood pressure is not adequately well controlled.  I recommended that he start taking a thiazide diuretic.  Will screen for secondary causes and endorgan damage. -     Basic metabolic panel; Future -     TSH; Future -     CBC with Differential/Platelet; Future -     VITAMIN D 25 Hydroxy (Vit-D Deficiency, Fractures); Future -     Urinalysis, Routine w reflex microscopic; Future -     Aldosterone + renin activity w/ ratio; Future -     EKG 12-Lead -     Aldosterone + renin activity w/ ratio -     Urinalysis, Routine w reflex microscopic -     VITAMIN D 25 Hydroxy (Vit-D Deficiency, Fractures) -     CBC with Differential/Platelet -     TSH -     Basic metabolic panel -     indapamide (LOZOL) 1.25 MG tablet; Take 1 tablet (1.25 mg total) by mouth daily.  Prediabetes- His A1c is up to 6.7%. -     Basic metabolic panel; Future -     Hemoglobin A1c; Future -     Hemoglobin A1c -     Basic metabolic panel  Hyperlipidemia LDL goal <100- I recommended that he take a statin for cardiovascular risk reduction. -     TSH; Future -     TSH -     rosuvastatin (CRESTOR) 10 MG tablet; Take 1 tablet (10 mg total) by mouth daily.  Routine general medical examination at a health care facility- Exam completed, labs reviewed, vaccines reviewed - He refused a flu vaccine, cancer screenings are up-to-date, patient education was given. -     Lipid panel; Future -      Lipid panel  Malignant neoplasm of prostate ([place])- Will start tx soon.  Abnormal electrocardiogram (ECG) (EKG)- Unchanged.  Type II diabetes mellitus with manifestations ([place])- I recommended that he treat this with Metformin and an SGLT2 inhibitor. -     HM Diabetes Foot Exam -     Ambulatory referral  to Ophthalmology -     Empagliflozin-metFORMIN HCl (SYNJARDY) 5-500 MG TABS; Take 1 tablet by mouth 2 (two) times daily.   I am having Antony Haste B. [place] start on indapamide, rosuvastatin, and Synjardy. I am also having him maintain his cholecalciferol and Rebif.  Meds ordered this encounter  Medications  . indapamide (LOZOL) 1.25 MG tablet    Sig: Take 1 tablet (1.25 mg total) by mouth daily.    Dispense:  90 tablet    Refill:  0  . rosuvastatin (CRESTOR) 10 MG tablet    Sig: Take 1 tablet (10 mg total) by mouth daily.    Dispense:  90 tablet    Refill:  1  . Empagliflozin-metFORMIN HCl (SYNJARDY) 5-500 MG TABS    Sig: Take 1 tablet by mouth 2 (two) times daily.    Dispense:  180 tablet    Refill:  0     Follow-up: Return in about 3 months (around 01/05/2021).  Scarlette Calico, MD

## 2020-10-05 NOTE — Patient Instructions (Signed)

## 2020-10-11 ENCOUNTER — Telehealth: Payer: Self-pay | Admitting: *Deleted

## 2020-10-11 LAB — ALDOSTERONE + RENIN ACTIVITY W/ RATIO
ALDO / PRA Ratio: 76.5 Ratio — ABNORMAL HIGH (ref 0.9–28.9)
Aldosterone: 13 ng/dL
Renin Activity: 0.17 ng/mL/h — ABNORMAL LOW (ref 0.25–5.82)

## 2020-10-11 NOTE — Telephone Encounter (Signed)
CALLED PATIENT TO INFORM OF PRE-SEED APPTS. FOR 11-09-20, SPOKE WITH PATIENT AND HE IS AWARE OF THESE APPTS.

## 2020-10-12 ENCOUNTER — Other Ambulatory Visit: Payer: Self-pay | Admitting: Urology

## 2020-10-12 DIAGNOSIS — C61 Malignant neoplasm of prostate: Secondary | ICD-10-CM

## 2020-10-13 ENCOUNTER — Telehealth: Payer: Self-pay | Admitting: *Deleted

## 2020-10-13 NOTE — Telephone Encounter (Signed)
Called patient to inform of pre-seed appts. for 11-09-20 and his implant on 12-07-20, spoke with patient and he is aware of these appts.

## 2020-11-07 ENCOUNTER — Ambulatory Visit: Payer: BC Managed Care – PPO | Admitting: Radiation Oncology

## 2020-11-08 ENCOUNTER — Telehealth: Payer: Self-pay | Admitting: *Deleted

## 2020-11-08 NOTE — Telephone Encounter (Signed)
CALLED PATIENT TO REMIND OF PRE-SEED APPTS. FOR 11-09-20, SPOKE WITH PATIENT AND HE IS AWARE OF THESE APPTS.

## 2020-11-09 ENCOUNTER — Ambulatory Visit
Admission: RE | Admit: 2020-11-09 | Discharge: 2020-11-09 | Disposition: A | Discharge status: Home / Self Care | Payer: BC Managed Care – PPO | Source: Ambulatory Visit | Attending: Urology | Admitting: Urology

## 2020-11-09 ENCOUNTER — Ambulatory Visit (HOSPITAL_COMMUNITY)
Admission: RE | Admit: 2020-11-09 | Discharge: 2020-11-09 | Disposition: A | Discharge status: Home / Self Care | Payer: BC Managed Care – PPO | Source: Ambulatory Visit | Attending: Urology | Admitting: Urology

## 2020-11-09 ENCOUNTER — Encounter: Payer: Self-pay | Admitting: Medical Oncology

## 2020-11-09 ENCOUNTER — Other Ambulatory Visit: Payer: Self-pay

## 2020-11-09 ENCOUNTER — Ambulatory Visit
Admission: RE | Admit: 2020-11-09 | Discharge: 2020-11-09 | Disposition: A | Discharge status: Home / Self Care | Payer: BC Managed Care – PPO | Source: Ambulatory Visit | Attending: Radiation Oncology | Admitting: Radiation Oncology

## 2020-11-09 ENCOUNTER — Encounter (HOSPITAL_COMMUNITY)
Admission: RE | Admit: 2020-11-09 | Discharge: 2020-11-09 | Disposition: A | Discharge status: Home / Self Care | Payer: BC Managed Care – PPO | Source: Ambulatory Visit | Attending: Urology | Admitting: Urology

## 2020-11-09 DIAGNOSIS — C61 Malignant neoplasm of prostate: Secondary | ICD-10-CM

## 2020-11-09 NOTE — Progress Notes (Signed)
  Radiation Oncology         [phone] ________________________________  Name: EMMAUS BRANDI MRN: [id number]  Date: 11/09/2020  DOB: Feb 24, 1961  SIMULATION AND TREATMENT PLANNING NOTE PUBIC ARCH STUDY  KD:TOIZT, Arvid Right, MD  Ardis Hughs, MD  DIAGNOSIS: 60 y.o. gentleman with Stage T1c adenocarcinoma of the prostate with Gleason score of 4+4, and PSA of 13.9.  Oncology History  Malignant neoplasm of prostate ([place])  05/09/2020 Cancer Staging   Staging form: Prostate, AJCC 8th Edition - Clinical stage from 05/09/2020: Stage IIC (cT1c, cN0, cM0, PSA: 13.9, Grade Group: 4) - Signed by Freeman Caldron, PA-C on 10/03/2020 Histopathologic type: Adenocarcinoma, NOS Stage prefix: Initial diagnosis Prostate specific antigen (PSA) range: 10 to 19 Gleason primary pattern: 4 Gleason secondary pattern: 4 Gleason score: 8 Histologic grading system: 5 grade system Number of biopsy cores examined: 12 Number of biopsy cores positive: 6 Location of positive needle core biopsies: Both sides   10/03/2020 Initial Diagnosis   Malignant neoplasm of prostate ([place])       ICD-10-CM   1. Malignant neoplasm of prostate ([place])  C61     COMPLEX SIMULATION:  The patient presented today for evaluation for possible prostate seed implant. He was brought to the radiation planning suite and placed supine on the CT couch. A 3-dimensional image study set was obtained in upload to the planning computer. There, on each axial slice, I contoured the prostate gland. Then, using three-dimensional radiation planning tools I reconstructed the prostate in view of the structures from the transperineal needle pathway to assess for possible pubic arch interference. In doing so, I did not appreciate any pubic arch interference. Also, the patient's prostate volume was estimated based on the drawn structure. The volume was 27 cc.  Given the pubic arch appearance and prostate volume, patient remains a good candidate to proceed  with prostate seed implant. Today, he freely provided informed written consent to proceed.    PLAN: The patient will undergo prostate seed implant.   ________________________________  Sheral Apley. Tammi Klippel, M.D.

## 2020-11-18 ENCOUNTER — Other Ambulatory Visit: Payer: Self-pay

## 2020-11-18 ENCOUNTER — Ambulatory Visit
Admission: RE | Admit: 2020-11-18 | Discharge: 2020-11-18 | Disposition: A | Discharge status: Home / Self Care | Payer: BC Managed Care – PPO | Source: Ambulatory Visit | Attending: Neurology | Admitting: Neurology

## 2020-11-18 DIAGNOSIS — G35 Multiple sclerosis: Secondary | ICD-10-CM

## 2020-11-18 MED ORDER — GADOBENATE DIMEGLUMINE 529 MG/ML IV SOLN
20.0000 mL | Freq: Once | INTRAVENOUS | Status: AC | PRN
  Administered 2020-11-18: 20 mL via INTRAVENOUS

## 2020-11-20 ENCOUNTER — Other Ambulatory Visit: Payer: Self-pay

## 2020-11-20 DIAGNOSIS — G35 Multiple sclerosis: Secondary | ICD-10-CM

## 2020-11-21 ENCOUNTER — Other Ambulatory Visit (INDEPENDENT_AMBULATORY_CARE_PROVIDER_SITE_OTHER): Payer: BC Managed Care – PPO

## 2020-11-21 ENCOUNTER — Other Ambulatory Visit: Payer: Self-pay

## 2020-11-21 DIAGNOSIS — G35 Multiple sclerosis: Secondary | ICD-10-CM | POA: Diagnosis not present

## 2020-11-22 LAB — HEPATIC FUNCTION PANEL (6)
ALT: 28 IU/L (ref 0–44)
AST: 19 IU/L (ref 0–40)
Albumin: 4.3 g/dL (ref 3.8–4.9)
Alkaline Phosphatase: 72 IU/L (ref 44–121)
Bilirubin Total: 0.3 mg/dL (ref 0.0–1.2)
Bilirubin, Direct: 0.11 mg/dL (ref 0.00–0.40)

## 2020-11-22 LAB — SPECIMEN STATUS REPORT

## 2020-12-01 ENCOUNTER — Encounter (HOSPITAL_BASED_OUTPATIENT_CLINIC_OR_DEPARTMENT_OTHER): Payer: Self-pay | Admitting: Urology

## 2020-12-01 ENCOUNTER — Other Ambulatory Visit: Payer: Self-pay

## 2020-12-01 NOTE — Progress Notes (Addendum)
Spoke w/ via phone for pre-op interview---PT Lab needs dos----none               Lab results------ekg 10-05-2020 epic, chest xray 11-09-2020 epic, has lab appt 12-04-2020 900 am for cbc cmp, pt ptt COVID test ------12-04-2020 1030 Arrive at -------900 am 12-07-2020 NPO after MN NO Solid Food.  Clear liquids from MN until---800 am then npo Med rec completed Medications to take morning of surgery -----none Diabetic medication -----none day of surgery Patient instructed to bring photo id and insurance card day of surgery Patient aware to have Driver (ride ) / caregiver  Devon Barron   for 24 hours after surgery  Patient Special Instructions -----fleets enama am of surgery Pre-Op special Istructions -----none Patient verbalized understanding of instructions that were given at this phone interview. Patient denies shortness of breath, chest pain, fever, cough at this phone interview.  lov neurology 06-28-2020 epic

## 2020-12-04 ENCOUNTER — Encounter (HOSPITAL_COMMUNITY)
Admission: RE | Admit: 2020-12-04 | Discharge: 2020-12-04 | Disposition: A | Discharge status: Home / Self Care | Payer: BC Managed Care – PPO | Source: Ambulatory Visit | Attending: Urology | Admitting: Urology

## 2020-12-04 ENCOUNTER — Other Ambulatory Visit: Payer: Self-pay

## 2020-12-04 ENCOUNTER — Other Ambulatory Visit (HOSPITAL_COMMUNITY)
Admission: RE | Admit: 2020-12-04 | Discharge: 2020-12-04 | Disposition: A | Discharge status: Home / Self Care | Payer: BC Managed Care – PPO | Source: Ambulatory Visit | Attending: Urology | Admitting: Urology

## 2020-12-04 DIAGNOSIS — Z79899 Other long term (current) drug therapy: Secondary | ICD-10-CM | POA: Diagnosis not present

## 2020-12-04 DIAGNOSIS — G35 Multiple sclerosis: Secondary | ICD-10-CM | POA: Diagnosis not present

## 2020-12-04 DIAGNOSIS — Z01812 Encounter for preprocedural laboratory examination: Secondary | ICD-10-CM | POA: Insufficient documentation

## 2020-12-04 DIAGNOSIS — Z20822 Contact with and (suspected) exposure to covid-19: Secondary | ICD-10-CM | POA: Insufficient documentation

## 2020-12-04 DIAGNOSIS — C61 Malignant neoplasm of prostate: Secondary | ICD-10-CM | POA: Diagnosis not present

## 2020-12-04 DIAGNOSIS — N529 Male erectile dysfunction, unspecified: Secondary | ICD-10-CM | POA: Diagnosis not present

## 2020-12-04 DIAGNOSIS — Z8042 Family history of malignant neoplasm of prostate: Secondary | ICD-10-CM | POA: Diagnosis not present

## 2020-12-04 LAB — CBC
HCT: 40.8 % (ref 39.0–52.0)
Hemoglobin: 13.1 g/dL (ref 13.0–17.0)
MCH: 28.7 pg (ref 26.0–34.0)
MCHC: 32.1 g/dL (ref 30.0–36.0)
MCV: 89.5 fL (ref 80.0–100.0)
Platelets: 90 10*3/uL — ABNORMAL LOW (ref 150–400)
RBC: 4.56 MIL/uL (ref 4.22–5.81)
RDW: 14.1 % (ref 11.5–15.5)
WBC: 5.9 10*3/uL (ref 4.0–10.5)
nRBC: 0 % (ref 0.0–0.2)

## 2020-12-04 LAB — COMPREHENSIVE METABOLIC PANEL
ALT: 30 U/L (ref 0–44)
AST: 26 U/L (ref 15–41)
Albumin: 4.4 g/dL (ref 3.5–5.0)
Alkaline Phosphatase: 51 U/L (ref 38–126)
Anion gap: 10 (ref 5–15)
BUN: 16 mg/dL (ref 6–20)
CO2: 26 mmol/L (ref 22–32)
Calcium: 9.9 mg/dL (ref 8.9–10.3)
Chloride: 102 mmol/L (ref 98–111)
Creatinine, Ser: 0.95 mg/dL (ref 0.61–1.24)
GFR, Estimated: >60 mL/min (ref >60)
Glucose, Bld: 126 mg/dL — ABNORMAL HIGH (ref 70–99)
Potassium: 3.3 mmol/L — ABNORMAL LOW (ref 3.5–5.1)
Sodium: 138 mmol/L (ref 135–145)
Total Bilirubin: 0.8 mg/dL (ref 0.3–1.2)
Total Protein: 8.3 g/dL — ABNORMAL HIGH (ref 6.5–8.1)

## 2020-12-04 LAB — PROTIME-INR
INR: 1 (ref 0.8–1.2)
Prothrombin Time: 12.8 seconds (ref 11.4–15.2)

## 2020-12-04 LAB — APTT: aPTT: 31 seconds (ref 24–36)

## 2020-12-05 LAB — SARS CORONAVIRUS 2 (TAT 6-24 HRS): SARS Coronavirus 2: NEGATIVE

## 2020-12-06 ENCOUNTER — Telehealth: Payer: Self-pay | Admitting: *Deleted

## 2020-12-06 MED ORDER — METHOCARBAMOL 500 MG IVPB - SIMPLE MED
INTRAVENOUS | Status: AC
  Filled 2020-12-06: qty 50

## 2020-12-06 MED ORDER — FENTANYL CITRATE (PF) 100 MCG/2ML IJ SOLN
INTRAMUSCULAR | Status: AC
  Filled 2020-12-06: qty 4

## 2020-12-06 NOTE — Telephone Encounter (Signed)
CALLED PATIENT TO REMIND OF PROCEDURE FOR 12-07-20, SPOKE WITH PATIENT AND HE IS AWARE OF THIS PROCEDURE

## 2020-12-06 NOTE — Anesthesia Preprocedure Evaluation (Addendum)
Anesthesia Evaluation  Patient identified by MRN, date of birth, ID band Patient awake    Reviewed: Allergy & Precautions, NPO status , Patient's Chart, lab work & pertinent test results  History of Anesthesia Complications Negative for: history of anesthetic complications  Airway Mallampati: II  TM Distance: >3 FB Neck ROM: Full    Dental no notable dental hx. (+)    Pulmonary neg pulmonary ROS,    Pulmonary exam normal        Cardiovascular hypertension, Normal cardiovascular exam     Neuro/Psych MS negative psych ROS   GI/Hepatic negative GI ROS, Neg liver ROS,   Endo/Other  diabetes, Type 2, Oral Hypoglycemic Agents  Renal/GU negative Renal ROS  negative genitourinary   Musculoskeletal negative musculoskeletal ROS (+)   Abdominal   Peds  Hematology negative hematology ROS (+)   Anesthesia Other Findings Day of surgery medications reviewed with patient.  Reproductive/Obstetrics negative OB ROS                            Anesthesia Physical Anesthesia Plan  ASA: II  Anesthesia Plan: General   Post-op Pain Management:    Induction: Intravenous  PONV Risk Score and Plan: 2 and Treatment may vary due to age or medical condition, Ondansetron, Dexamethasone and Midazolam  Airway Management Planned: LMA  Additional Equipment: None  Intra-op Plan:   Post-operative Plan: Extubation in OR  Informed Consent: I have reviewed the patients History and Physical, chart, labs and discussed the procedure including the risks, benefits and alternatives for the proposed anesthesia with the patient or authorized representative who has indicated his/her understanding and acceptance.     Dental advisory given  Plan Discussed with: CRNA  Anesthesia Plan Comments:        Anesthesia Quick Evaluation

## 2020-12-07 ENCOUNTER — Ambulatory Visit (HOSPITAL_BASED_OUTPATIENT_CLINIC_OR_DEPARTMENT_OTHER): Payer: BC Managed Care – PPO | Admitting: Anesthesiology

## 2020-12-07 ENCOUNTER — Other Ambulatory Visit: Payer: Self-pay

## 2020-12-07 ENCOUNTER — Encounter (HOSPITAL_BASED_OUTPATIENT_CLINIC_OR_DEPARTMENT_OTHER): Payer: Self-pay | Admitting: Urology

## 2020-12-07 ENCOUNTER — Encounter: Payer: Self-pay | Admitting: Medical Oncology

## 2020-12-07 ENCOUNTER — Ambulatory Visit (HOSPITAL_BASED_OUTPATIENT_CLINIC_OR_DEPARTMENT_OTHER)
Admission: RE | Admit: 2020-12-07 | Discharge: 2020-12-07 | Disposition: A | Discharge status: Home / Self Care | Payer: BC Managed Care – PPO | Attending: Urology | Admitting: Urology

## 2020-12-07 ENCOUNTER — Ambulatory Visit (HOSPITAL_COMMUNITY): Payer: BC Managed Care – PPO

## 2020-12-07 ENCOUNTER — Encounter (HOSPITAL_BASED_OUTPATIENT_CLINIC_OR_DEPARTMENT_OTHER)
Admission: RE | Disposition: A | Discharge status: Home / Self Care | Payer: Self-pay | Source: Home / Self Care | Attending: Urology

## 2020-12-07 DIAGNOSIS — Z20822 Contact with and (suspected) exposure to covid-19: Secondary | ICD-10-CM | POA: Insufficient documentation

## 2020-12-07 DIAGNOSIS — N529 Male erectile dysfunction, unspecified: Secondary | ICD-10-CM | POA: Insufficient documentation

## 2020-12-07 DIAGNOSIS — Z79899 Other long term (current) drug therapy: Secondary | ICD-10-CM | POA: Insufficient documentation

## 2020-12-07 DIAGNOSIS — Z8042 Family history of malignant neoplasm of prostate: Secondary | ICD-10-CM | POA: Insufficient documentation

## 2020-12-07 DIAGNOSIS — C61 Malignant neoplasm of prostate: Secondary | ICD-10-CM | POA: Diagnosis not present

## 2020-12-07 DIAGNOSIS — G35 Multiple sclerosis: Secondary | ICD-10-CM | POA: Insufficient documentation

## 2020-12-07 HISTORY — PX: RADIOACTIVE SEED IMPLANT: SHX5150

## 2020-12-07 HISTORY — PX: SPACE OAR INSTILLATION: SHX6769

## 2020-12-07 HISTORY — DX: Type 2 diabetes mellitus without complications: E11.9

## 2020-12-07 HISTORY — DX: Hyperlipidemia, unspecified: E78.5

## 2020-12-07 HISTORY — PX: CYSTOSCOPY: SHX5120

## 2020-12-07 LAB — GLUCOSE, CAPILLARY
Glucose-Capillary: 106 mg/dL — ABNORMAL HIGH (ref 70–99)
Glucose-Capillary: 119 mg/dL — ABNORMAL HIGH (ref 70–99)

## 2020-12-07 SURGERY — INSERTION, RADIATION SOURCE, PROSTATE
Anesthesia: General | Site: Urethra

## 2020-12-07 MED ORDER — PROPOFOL 10 MG/ML IV BOLUS
INTRAVENOUS | Status: AC
  Filled 2020-12-07: qty 20

## 2020-12-07 MED ORDER — LACTATED RINGERS IV SOLN: INTRAVENOUS | Status: DC

## 2020-12-07 MED ORDER — PROPOFOL 10 MG/ML IV BOLUS
INTRAVENOUS | Status: DC | PRN
  Administered 2020-12-07: 40 mg via INTRAVENOUS
  Administered 2020-12-07: 50 mg via INTRAVENOUS
  Administered 2020-12-07: 20 mg via INTRAVENOUS
  Administered 2020-12-07: 200 mg via INTRAVENOUS

## 2020-12-07 MED ORDER — FLEET ENEMA 7-19 GM/118ML RE ENEM
1.0000 | ENEMA | Freq: Once | RECTAL | Status: DC
Start: 2020-12-08 — End: 2020-12-07

## 2020-12-07 MED ORDER — SODIUM CHLORIDE 0.9 % IV SOLN
INTRAVENOUS | Status: AC | PRN
  Administered 2020-12-07: 100 mL

## 2020-12-07 MED ORDER — ONDANSETRON HCL 4 MG/2ML IJ SOLN
INTRAMUSCULAR | Status: AC
  Filled 2020-12-07: qty 2

## 2020-12-07 MED ORDER — TAMSULOSIN HCL 0.4 MG PO CAPS: 0.4000 mg | ORAL_CAPSULE | Freq: Every day | ORAL | 0 refills | Status: DC

## 2020-12-07 MED ORDER — MIDAZOLAM HCL 2 MG/2ML IJ SOLN
INTRAMUSCULAR | Status: AC
  Filled 2020-12-07: qty 2

## 2020-12-07 MED ORDER — ACETAMINOPHEN 500 MG PO TABS
ORAL_TABLET | ORAL | Status: AC
  Filled 2020-12-07: qty 2

## 2020-12-07 MED ORDER — CIPROFLOXACIN IN D5W 400 MG/200ML IV SOLN
400.0000 mg | INTRAVENOUS | Status: AC
  Administered 2020-12-07: 400 mg via INTRAVENOUS

## 2020-12-07 MED ORDER — STERILE WATER FOR IRRIGATION IR SOLN
Status: DC | PRN
  Administered 2020-12-07: 500 mL

## 2020-12-07 MED ORDER — FENTANYL CITRATE (PF) 100 MCG/2ML IJ SOLN: 25.0000 ug | INTRAMUSCULAR | Status: DC | PRN

## 2020-12-07 MED ORDER — DEXAMETHASONE SODIUM PHOSPHATE 10 MG/ML IJ SOLN
INTRAMUSCULAR | Status: DC | PRN
  Administered 2020-12-07: 4 mg via INTRAVENOUS

## 2020-12-07 MED ORDER — PROMETHAZINE HCL 25 MG/ML IJ SOLN: 6.2500 mg | INTRAMUSCULAR | Status: DC | PRN

## 2020-12-07 MED ORDER — LIDOCAINE 2% (20 MG/ML) 5 ML SYRINGE
INTRAMUSCULAR | Status: DC | PRN
  Administered 2020-12-07: 100 mg via INTRAVENOUS

## 2020-12-07 MED ORDER — ONDANSETRON HCL 4 MG/2ML IJ SOLN
INTRAMUSCULAR | Status: DC | PRN
  Administered 2020-12-07: 4 mg via INTRAVENOUS

## 2020-12-07 MED ORDER — DEXAMETHASONE SODIUM PHOSPHATE 10 MG/ML IJ SOLN
INTRAMUSCULAR | Status: AC
  Filled 2020-12-07: qty 1

## 2020-12-07 MED ORDER — TRAMADOL HCL 50 MG PO TABS: 50.0000 mg | ORAL_TABLET | Freq: Four times a day (QID) | ORAL | 0 refills | Status: DC | PRN

## 2020-12-07 MED ORDER — OXYCODONE HCL 5 MG PO TABS: 5.0000 mg | ORAL_TABLET | Freq: Once | ORAL | Status: DC | PRN

## 2020-12-07 MED ORDER — OXYCODONE HCL 5 MG/5ML PO SOLN: 5.0000 mg | Freq: Once | ORAL | Status: DC | PRN

## 2020-12-07 MED ORDER — FENTANYL CITRATE (PF) 100 MCG/2ML IJ SOLN
INTRAMUSCULAR | Status: DC | PRN
  Administered 2020-12-07 (×2): 25 ug via INTRAVENOUS

## 2020-12-07 MED ORDER — SODIUM CHLORIDE (PF) 0.9 % IJ SOLN
INTRAMUSCULAR | Status: DC | PRN
  Administered 2020-12-07: 10 mL

## 2020-12-07 MED ORDER — ACETAMINOPHEN 500 MG PO TABS
1000.0000 mg | ORAL_TABLET | Freq: Once | ORAL | Status: AC
  Administered 2020-12-07: 1000 mg via ORAL

## 2020-12-07 MED ORDER — IOHEXOL 300 MG/ML  SOLN
INTRAMUSCULAR | Status: DC | PRN
  Administered 2020-12-07: 7 mL

## 2020-12-07 MED ORDER — CIPROFLOXACIN IN D5W 400 MG/200ML IV SOLN
INTRAVENOUS | Status: AC
  Filled 2020-12-07: qty 200

## 2020-12-07 MED ORDER — FENTANYL CITRATE (PF) 100 MCG/2ML IJ SOLN
INTRAMUSCULAR | Status: AC
  Filled 2020-12-07: qty 2

## 2020-12-07 MED ORDER — MIDAZOLAM HCL 5 MG/5ML IJ SOLN
INTRAMUSCULAR | Status: DC | PRN
  Administered 2020-12-07: 2 mg via INTRAVENOUS

## 2020-12-07 MED ORDER — LIDOCAINE 2% (20 MG/ML) 5 ML SYRINGE
INTRAMUSCULAR | Status: AC
  Filled 2020-12-07: qty 5

## 2020-12-07 SURGICAL SUPPLY — 32 items
BAG DRN RND TRDRP ANRFLXCHMBR (UROLOGICAL SUPPLIES) ×3
BAG URINE DRAIN 2000ML AR STRL (UROLOGICAL SUPPLIES) ×4 IMPLANT
BLADE CLIPPER SENSICLIP SURGIC (BLADE) ×4 IMPLANT
CATH FOLEY 2WAY SLVR  5CC 16FR (CATHETERS) ×4
CATH FOLEY 2WAY SLVR 5CC 16FR (CATHETERS) ×3 IMPLANT
CATH ROBINSON RED A/P 20FR (CATHETERS) ×4 IMPLANT
CLOTH BEACON ORANGE TIMEOUT ST (SAFETY) ×4 IMPLANT
CNTNR URN SCR LID CUP LEK RST (MISCELLANEOUS) ×6 IMPLANT
CONT SPEC 4OZ STRL OR WHT (MISCELLANEOUS) ×8
COVER BACK TABLE 60X90IN (DRAPES) ×4 IMPLANT
COVER MAYO STAND STRL (DRAPES) ×4 IMPLANT
DRSG TEGADERM 4X4.75 (GAUZE/BANDAGES/DRESSINGS) ×4 IMPLANT
DRSG TEGADERM 8X12 (GAUZE/BANDAGES/DRESSINGS) ×4 IMPLANT
GAUZE SPONGE 4X4 12PLY STRL (GAUZE/BANDAGES/DRESSINGS) ×1 IMPLANT
GLOVE SURG ENC MOIS LTX SZ6.5 (GLOVE) ×4 IMPLANT
GLOVE SURG ENC MOIS LTX SZ7.5 (GLOVE) ×8 IMPLANT
GLOVE SURG POLYISO LF SZ6.5 (GLOVE) ×1 IMPLANT
GLOVE SURG UNDER POLY LF SZ7.5 (GLOVE) ×1 IMPLANT
GOWN STRL REUS W/TWL LRG LVL3 (GOWN DISPOSABLE) ×3 IMPLANT
GOWN STRL REUS W/TWL XL LVL3 (GOWN DISPOSABLE) ×4 IMPLANT
I Seed Ag X100 ×55 IMPLANT
IMPL SPACEOAR VUE SYSTEM (Spacer) IMPLANT
IMPLANT SPACEOAR VUE SYSTEM (Spacer) ×4 IMPLANT
IV NS 1000ML (IV SOLUTION) ×4
IV NS 1000ML BAXH (IV SOLUTION) ×6 IMPLANT
KIT TURNOVER CYSTO (KITS) ×4 IMPLANT
MARKER SKIN DUAL TIP RULER LAB (MISCELLANEOUS) ×4 IMPLANT
PACK CYSTO (CUSTOM PROCEDURE TRAY) ×4 IMPLANT
SYR 10ML LL (SYRINGE) ×4 IMPLANT
TOWEL OR 17X26 10 PK STRL BLUE (TOWEL DISPOSABLE) ×4 IMPLANT
UNDERPAD 30X36 HEAVY ABSORB (UNDERPADS AND DIAPERS) ×8 IMPLANT
WATER STERILE IRR 500ML POUR (IV SOLUTION) ×4 IMPLANT

## 2020-12-07 NOTE — Anesthesia Procedure Notes (Signed)
Procedure Name: LMA Insertion Date/Time: 12/07/2020 11:06 AM Performed by: Bonney Aid, CRNA Pre-anesthesia Checklist: Patient identified, Emergency Drugs available, Suction available and Patient being monitored Patient Re-evaluated:Patient Re-evaluated prior to induction Oxygen Delivery Method: Circle system utilized Preoxygenation: Pre-oxygenation with 100% oxygen Induction Type: IV induction Ventilation: Mask ventilation without difficulty LMA: LMA inserted LMA Size: 5.0 Number of attempts: 1 Airway Equipment and Method: Bite block Placement Confirmation: positive ETCO2 Tube secured with: Tape Dental Injury: Teeth and Oropharynx as per pre-operative assessment

## 2020-12-07 NOTE — Discharge Instructions (Signed)
° ° °Post Anesthesia Home Care Instructions ° °Activity: °Get plenty of rest for the remainder of the day. A responsible individual must stay with you for 24 hours following the procedure.  °For the next 24 hours, DO NOT: °-Drive a car °-Operate machinery °-Drink alcoholic beverages °-Take any medication unless instructed by your physician °-Make any legal decisions or sign important papers. ° °Meals: °Start with liquid foods such as gelatin or soup. Progress to regular foods as tolerated. Avoid greasy, spicy, heavy foods. If nausea and/or vomiting occur, drink only clear liquids until the nausea and/or vomiting subsides. Call your physician if vomiting continues. ° °Special Instructions/Symptoms: °Your throat may feel dry or sore from the anesthesia or the breathing tube placed in your throat during surgery. If this causes discomfort, gargle with warm salt water. The discomfort should disappear within 24 hours. ° °If you had a scopolamine patch placed behind your ear for the management of post- operative nausea and/or vomiting: ° °1. The medication in the patch is effective for 72 hours, after which it should be removed.  Wrap patch in a tissue and discard in the trash. Wash hands thoroughly with soap and water. °2. You may remove the patch earlier than 72 hours if you experience unpleasant side effects which may include dry mouth, dizziness or visual disturbances. °3. Avoid touching the patch. Wash your hands with soap and water after contact with the patch. °  °DISCHARGE INSTRUCTIONS FOR PROSTATE SEED IMPLANTATION ° °Antibiotics °You may be given a prescription for an antibiotic to take when you arrive home. If so, be sure to take every tablet in the bottle, even if you are feeling better before the prescription is finished. If you begin itching, notice a rash or start to swell on your trunk, arms, legs and/or throat, immediately stop taking the antibiotic and call your Urologist. °Diet °Resume your usual diet  when you return home. To keep your bowels moving easily and softly, drink prune, apple and cranberry juice at room temperature. You may also take a stool softener, such as Colace, which is available without prescription at local pharmacies. °Daily activities °? No driving or heavy lifting for at least two days after the implant. °? No bike riding, horseback riding or riding lawn mowers for the first month after the implant. °? Any strenuous physical activity should be approved by your doctor before you resume it. °Sexual relations °You may resume sexual relations two weeks after the procedure. A condom should be used for the first two weeks. Your semen may be dark brown or black; this is normal and is related bleeding that may have occurred during the implant. °Postoperative swelling °Expect swelling and bruising of the scrotum and perineum (the area between the scrotum and anus). Both the swelling and the bruising should resolve in l or 2 weeks. Ice packs and over- the-counter medications such as Tylenol, Advil or Aleve may lessen your discomfort. °Postoperative urination °Most men experience burning on urination and/or urinary frequency. If this becomes bothersome, contact your Urologist.  Medication can be prescribed to relieve these problems.  It is normal to have some blood in your urine for a few days after the implant. °Special instructions related to the seeds °It is unlikely that you will pass an Iodine-125 seed in your urine. The seeds are silver in color and are about as large as a grain of rice. If you pass a seed, do not handle it with your fingers. Use a spoon to place it   in an envelope or jar in place this in base occluded area such as the garage or basement for return to the radiation clinic at your convenience. ° °Contact your doctor for °? Temperature greater than 101 F °? Increasing pain °? Inability to urinate °Follow-up ° You should have follow up with your urologist and radiation oncologist  about 3 weeks after the procedure. °General information regarding Iodine seeds °? Iodine-125 is a low energy radioactive material. It is not deeply penetrating and loses energy at short distances. Your prostate will absorb the radiation. Objects that are touched or used by the patient do not become radioactive. °? Body wastes (urine and stool) or body fluids (saliva, tears, semen or blood) are not radioactive. °? The Nuclear Regulatory Commission (NRC) has determined that no radiation precautions are needed for patients undergoing Iodine-125 seed implantation. The NRC states that such patients do not present a risk to the people around them, including young children and pregnant women. However, in keeping with the general principle that radiation exposure should be kept as low reasonably possible, we suggest the following: °? Children and pets should not sit on the patient's lap for the first two (2) weeks after the implant. °? Pregnant (or possibly pregnant) women should avoid prolonged, close contact with the patient for the first two (2) weeks after the implant. °? A distance of three (3) feet is acceptable. °? At a distance of three (3) feet, there is no limit to the length of time anyone can be with the patient. °?  ° °

## 2020-12-07 NOTE — Progress Notes (Signed)
  Radiation Oncology         [phone] ________________________________  Name: Devon Barron MRN: [id number]  Date: 12/07/2020  DOB: 07/14/1961       Prostate Seed Implant  ZD:GLOVF, Arvid Right, MD  No ref. provider found  DIAGNOSIS:  60 y.o. gentleman with Stage T1c adenocarcinoma of the prostate with Gleason score of 4+4, and PSA of 13.9.  Oncology History  Malignant neoplasm of prostate ([place])  05/09/2020 Cancer Staging   Staging form: Prostate, AJCC 8th Edition - Clinical stage from 05/09/2020: Stage IIC (cT1c, cN0, cM0, PSA: 13.9, Grade Group: 4) - Signed by Freeman Caldron, PA-C on 10/03/2020 Histopathologic type: Adenocarcinoma, NOS Stage prefix: Initial diagnosis Prostate specific antigen (PSA) range: 10 to 19 Gleason primary pattern: 4 Gleason secondary pattern: 4 Gleason score: 8 Histologic grading system: 5 grade system Number of biopsy cores examined: 12 Number of biopsy cores positive: 6 Location of positive needle core biopsies: Both sides   10/03/2020 Initial Diagnosis   Malignant neoplasm of prostate ([facility])     PROCEDURE: Insertion of radioactive I-125 seeds into the prostate gland.  RADIATION DOSE: 110 Gy, boost therapy.  TECHNIQUE: [name] was brought to the operating room with the urologist. He was placed in the dorsolithotomy position. He was catheterized and a rectal tube was inserted. The perineum was shaved, prepped and draped. The ultrasound probe was then introduced into the rectum to see the prostate gland.  TREATMENT DEVICE: A needle grid was attached to the ultrasound probe stand and anchor needles were placed.  3D PLANNING: The prostate was imaged in 3D using a sagittal sweep of the prostate probe. These images were transferred to the planning computer. There, the prostate, urethra and rectum were defined on each axial reconstructed image. Then, the software created an optimized 3D plan and a few seed positions were adjusted. The quality of the plan  was reviewed using [facility] information for the target and the following two organs at risk:  Urethra and Rectum.  Then the accepted plan was printed and handed off to the radiation therapist.  Under my supervision, the custom loading of the seeds and spacers was carried out and loaded into sealed vicryl sleeves.  These pre-loaded needles were then placed into the needle holder.Marland Kitchen  PROSTATE VOLUME STUDY:  Using transrectal ultrasound the volume of the prostate was verified to be 20.4 cc.  SPECIAL TREATMENT PROCEDURE/SUPERVISION AND HANDLING: The pre-loaded needles were then delivered under sagittal guidance. A total of 20 needles were used to deposit 55 seeds in the prostate gland. The individual seed activity was 0.276 mCi.  SpaceOAR:  Yes  COMPLEX SIMULATION: At the end of the procedure, an anterior radiograph of the pelvis was obtained to document seed positioning and count. Cystoscopy was performed to check the urethra and bladder.  MICRODOSIMETRY: At the end of the procedure, the patient was emitting 0.075 mR/hr at 1 meter. Accordingly, he was considered safe for hospital discharge.  PLAN: The patient will return to the radiation oncology clinic for post implant CT dosimetry in three weeks.   ________________________________  Sheral Apley Tammi Klippel, M.D.

## 2020-12-07 NOTE — Anesthesia Postprocedure Evaluation (Signed)
Anesthesia Post Note  Patient: Devon Barron  Procedure(s) Performed: RADIOACTIVE SEED IMPLANT/BRACHYTHERAPY IMPLANT (N/A Prostate) SPACE OAR INSTILLATION (N/A ) CYSTOSCOPY FLEXIBLE (Urethra)     Patient location during evaluation: PACU Anesthesia Type: General Level of consciousness: awake and alert and oriented Pain management: pain level controlled Vital Signs Assessment: post-procedure vital signs reviewed and stable Respiratory status: spontaneous breathing, nonlabored ventilation and respiratory function stable Cardiovascular status: blood pressure returned to baseline Postop Assessment: no apparent nausea or vomiting Anesthetic complications: no   No complications documented.  Last Vitals:  Vitals:   12/07/20 1300 12/07/20 1315  BP: 134/89 140/90  Pulse: 77 68  Resp: 14 17  Temp:    SpO2: 100% 100%    Last Pain:  Vitals:   12/07/20 1228  TempSrc:   PainSc: [place]

## 2020-12-07 NOTE — Transfer of Care (Signed)
Immediate Anesthesia Transfer of Care Note  Patient: Devon Barron  Procedure(s) Performed: RADIOACTIVE SEED IMPLANT/BRACHYTHERAPY IMPLANT (N/A ) SPACE OAR INSTILLATION (N/A ) CYSTOSCOPY FLEXIBLE (Urethra)  Patient Location: PACU  Anesthesia Type:General  Level of Consciousness: sedated  Airway & Oxygen Therapy: Patient Spontanous Breathing and Patient connected to nasal cannula oxygen  Post-op Assessment: Report given to RN  Post vital signs: Reviewed and stable  Last Vitals:  Vitals Value Taken Time  BP 129/84 12/07/20 1228  Temp    Pulse 81   Resp 21 12/07/20 1229  SpO2 100   Vitals shown include unvalidated device data.  Last Pain:  Vitals:   12/07/20 0923  TempSrc: Oral  PainSc: 0-No pain      Patients Stated Pain Goal: 3 (60/60/04 5997)  Complications: No complications documented.

## 2020-12-07 NOTE — Op Note (Signed)
Preoperative diagnosis:  Clinical stage TI C adenocarcinoma the prostate  Postoperative diagnosis:  Same  Procedure:  #1 I-125 prostate seed implantation  #2 cystourethroscopy #3 instillation of SpaceOAR biogel  Surgeon: [name], M.D. Radiation Oncologist: [name], M.D.  Anesthesia: Gen.   Indications: Patient  was diagnosed with clinical stage TI C prostate cancer. We had extensive discussion with him about treatment options versus. He elected to proceed with seed implantation. He underwent consultation my office as well as with Dr. [name]. He appeared to understand the advantages disadvantages potential risks of this treatment option. Full informed consent has been obtained. The patient is had preoperative ciprofloxacin. PAS compression boots were placed.   Technique and findings: Patient was brought the operating room where he had  successful induction of general anesthesia. He was placed in lithotomy position and prepped and draped in usual manner. Appropriate surgical timeout was performed. Radiation oncology department placed a transrectal ultrasound probe anchoring stand. Foley catheter with contrast in the balloon was inserted without difficulty. Anchoring needles were placed within the prostate.  Real-time contouring of the urethra prostate and rectum were performed and the dosing parameters were established. Targeted dose was 145 gray. We then came to the operating suite suite for placement of the needles. A second timeout was performed. All needle passage was done with real-time transrectal ultrasound guidance with the sagittal plane. A total of 20 needles were placed.  25 active seeds were implanted.  The brachytherapy template was then removed.    A site in the midline was selected on the perineum for placement of an 18 g needle with saline.  The needle was advanced above the rectum and below Denonvillier's fascia to the mid gland and confirmed to be in the  midline on transverse imaging.  One cc of saline was injected confirming appropriate expansion of this space.  A total of 5 cc of saline was then injected to open the space further bilaterally.  The saline syringe was then removed and the SpaceOAR hydrogel was injected with good distribution bilaterally.A Foley catheter was removed and flexible cystoscopy failed to show any seeds outside the prostate.

## 2020-12-07 NOTE — Interval H&P Note (Signed)
History and Physical Interval Note:  12/07/2020 10:38 AM  Devon Barron  has presented today for surgery, with the diagnosis of PROSTATE CANCER.  The various methods of treatment have been discussed with the patient and family. After consideration of risks, benefits and other options for treatment, the patient has consented to  Procedure(s): RADIOACTIVE SEED IMPLANT/BRACHYTHERAPY IMPLANT (N/A) SPACE OAR INSTILLATION (N/A) as a surgical intervention.  The patient's history has been reviewed, patient examined, no change in status, stable for surgery.  I have reviewed the patient's chart and labs.  Questions were answered to the patient's satisfaction.     Ardis Hughs

## 2020-12-07 NOTE — H&P (Signed)
Devon Barron presents today for pre-operative history and physical exam in anticipation of brachytherapy and space oar placement by Dr. [name] on 12/07/20. He is doing well and is without complaint.   Devon Barron denies F/C, HA, CP, SOB, N/V, diarrhea/constipation, back pain, flank pain, hematuria, and dysuria.    HX:   The patient presents today for discussion of his prostate cancer.   Prostate cancer profile  Stage: T1c  PSA: 13.9  Biopsy 05/12/20 , 6/12 cores positive: (Gleason 8 in the left lateral base, mid, and apex: 90%, 90%, 50%)  Prostate volume: 26 g   H/o chronic MS, no progression in many years. Takes Rebiff.   Patient has some erectile dysfunction for which he takes medication for. He is not sexually active.   The patient has minimal voiding symptoms.   1/21: Today the patient presents for randomization in the Proteus prostate cancer clinical trial. The patient denies any progression of his lower urinary tract symptoms. He denies any neurological changes related to MS. He does have some elevated blood pressure but denies any significant side effects or symptoms of it. He has not seen a primary care provider now for over a year. He otherwise is doing very well.   Today the patient presents for his out take evaluation. The patient after further consideration has opted not to proceed with the research protocol. Instead, the patient is opted to pursue radiation therapy for his prostate cancer. He continues to have hot flashes some difficulty with energy and libido. Otherwise he is doing fine with minimal voiding symptoms or new onset pain.     ALLERGIES: No Allergies    MEDICATIONS: Indapamide  Multivitamin  Rebif 44 mcg/0.5 ml syringe  Vitafusion  Vitamin D3     GU PSH: Locm 300-399Mg /Ml Iodine,1Ml - 06/21/2020 Prostate Needle Biopsy - 05/09/2020 Vasectomy - 2010       [place] Notes: deviated septal repair 25 years ago   NON-GU PSH: Inguinal Hernia Repair > 5 yrs, 03/2013 Surgical  Pathology, Gross And Microscopic Examination For Prostate Needle - 05/09/2020     GU PMH: Prostate Cancer - 10/24/2020, - 09/11/2020, - 08/08/2020, - 06/21/2020, - 05/18/2020 Elevated PSA - 05/09/2020, - 03/20/2020, - 01/25/2020 Pelvic/perineal pain - 02/04/2020 RLQ pain - 01/25/2020 ED due to arterial insufficiency - 01/18/2020, - 10/18/2019, Erectile dysfunction due to arterial insufficiency, - 2014 Disorder of Penis Ot, Penile pain - 2014 Primary hypogonadism, Hypogonadism, testicular - 2014 Urinary Frequency, Increased urinary frequency - 2014      PMH Notes:  1898-08-05 00:00:00 - Note: Normal Routine History And Physical Adult   NON-GU PMH: Muscle weakness (generalized) - 02/04/2020 Other muscle spasm - 02/04/2020 Other specified disorders of muscle - 02/04/2020 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   Tobacco Use Assessment Completed: Used Tobacco in last 30 days? Does not use smokeless tobacco. Social Drinker.  Does not use drugs. Does not drink caffeine. Has not had a blood transfusion. Patient's occupation Community education officer.     Notes: ETOH occasional/social beer    REVIEW OF SYSTEMS:  GU Review Male:   Patient denies frequent urination, hard to postpone urination, burning/ pain with urination, get up at night to urinate, leakage of urine, stream starts and stops, trouble starting your stream, have to strain to urinate , erection problems, and penile pain.  Gastrointestinal (Upper):   Patient denies nausea, vomiting,  and indigestion/ heartburn.  Gastrointestinal (Lower):   Patient denies diarrhea and constipation.  Constitutional:   Patient denies fever, night sweats, weight loss, and fatigue.  Skin:   Patient denies skin rash/ lesion and itching.  Eyes:   Patient denies blurred vision and double vision.  Ears/ Nose/ Throat:   Patient denies sore throat and sinus problems.  Hematologic/Lymphatic:   Patient denies swollen glands and easy bruising.  Cardiovascular:   Patient denies leg swelling and chest pains.  Respiratory:   Patient denies cough and shortness of breath.  Endocrine:   Patient denies excessive thirst.  Musculoskeletal:   Patient denies back pain and joint pain.  Neurological:   Patient denies headaches and dizziness.  Psychologic:   Patient denies depression and anxiety.   VITAL SIGNS:      11/28/2020 01:48 PM  Weight 207 lb / 93.89 kg  Height 69 in / 175.26 cm  BP 127/85 mmHg  Pulse 93 /min  Temperature 97.8 F / 36.5 C  BMI 30.6 kg/m   MULTI-SYSTEM PHYSICAL EXAMINATION:    Constitutional: Well-nourished. No physical deformities. Normally developed. Good grooming.  Neck: Neck symmetrical, not swollen. Normal tracheal position.  Respiratory: Normal breath sounds. No labored breathing, no use of accessory muscles.   Cardiovascular: Regular rate and rhythm. No murmur, no gallop.   Lymphatic: No enlargement of neck, axillae, groin.  Skin: No paleness, no jaundice, no cyanosis. No lesion, no ulcer, no rash.  Neurologic / Psychiatric: Oriented to time, oriented to place, oriented to person. No depression, no anxiety, no agitation.  Gastrointestinal: No mass, no tenderness, no rigidity, obese abdomen.   Eyes: Normal conjunctivae. Normal eyelids.  Ears, Nose, Mouth, and Throat: Left ear no scars, no lesions, no masses. Right ear no scars, no lesions, no masses. Nose no scars, no lesions, no masses. Normal hearing. Normal lips.  Musculoskeletal: Normal gait and station of head and neck.      Complexity of Data:  Records Review:   Previous Patient Records  Urine Test Review:   Urinalysis   11/28/20  Urinalysis  Urine Appearance Clear   Urine Color Yellow   Urine Glucose Neg mg/dL  Urine Bilirubin Neg mg/dL  Urine Ketones Neg mg/dL  Urine Specific Gravity 1.025   Urine Blood Neg ery/uL  Urine pH 6.0   Urine Protein Neg mg/dL  Urine Urobilinogen 0.2 mg/dL  Urine Nitrites Neg   Urine Leukocyte Esterase Neg leu/uL   PROCEDURES:          Urinalysis - 81003 Dipstick Dipstick Cont'd  Color: Yellow Bilirubin: Neg mg/dL  Appearance: Clear Ketones: Neg mg/dL  Specific Gravity: 1.025 Blood: Neg ery/uL  pH: 6.0 Protein: Neg mg/dL  Glucose: Neg mg/dL Urobilinogen: 0.2 mg/dL    Nitrites: Neg    Leukocyte Esterase: Neg leu/uL    ASSESSMENT:      ICD-10 Details  1 GU:   Prostate Cancer - C61    PLAN:           Schedule Return Visit/Planned Activity: Keep Scheduled Appointment - Schedule Surgery          Document Letter(s):  Created for Patient: Clinical Summary  Notes:   There are no changes in the patients history or physical exam since last evaluation by Dr. [name]. Devon Barron is scheduled to undergo brachytherapy and space oar on 12/07/20.   All Devon Barron's questions were answered to the best of my ability.

## 2020-12-07 NOTE — Interval H&P Note (Signed)
History and Physical Interval Note:  12/07/2020 10:47 AM  Devon Barron  has presented today for surgery, with the diagnosis of PROSTATE CANCER.  The various methods of treatment have been discussed with the patient and family. After consideration of risks, benefits and other options for treatment, the patient has consented to  Procedure(s): RADIOACTIVE SEED IMPLANT/BRACHYTHERAPY IMPLANT (N/A) SPACE OAR INSTILLATION (N/A) as a surgical intervention.  The patient's history has been reviewed, patient examined, no change in status, stable for surgery.  I have reviewed the patient's chart and labs.  Questions were answered to the patient's satisfaction.     Ardis Hughs

## 2020-12-08 ENCOUNTER — Encounter (HOSPITAL_BASED_OUTPATIENT_CLINIC_OR_DEPARTMENT_OTHER): Payer: Self-pay | Admitting: Urology

## 2020-12-22 ENCOUNTER — Encounter: Payer: Self-pay | Admitting: Medical Oncology

## 2020-12-22 ENCOUNTER — Ambulatory Visit
Admission: RE | Admit: 2020-12-22 | Discharge: 2020-12-22 | Disposition: A | Discharge status: Home / Self Care | Payer: BC Managed Care – PPO | Source: Ambulatory Visit | Attending: Radiation Oncology | Admitting: Radiation Oncology

## 2020-12-22 DIAGNOSIS — C61 Malignant neoplasm of prostate: Secondary | ICD-10-CM | POA: Diagnosis present

## 2020-12-22 NOTE — Progress Notes (Signed)
  Radiation Oncology         [phone] ________________________________  Name: Devon Barron MRN: [id number]  Date: 12/22/2020  DOB: 04/07/1961  COMPLEX SIMULATION NOTE  NARRATIVE:  The patient was brought to the [name] today following prostate seed implantation approximately one month ago.  Identity was confirmed.  All relevant records and images related to the planned course of therapy were reviewed.  Then, the patient was set-up supine.  CT images were obtained.  The CT images were loaded into the planning software.  Then the prostate and rectum were contoured.  Treatment planning then occurred.  The implanted iodine 125 seeds were identified by the physics staff for projection of radiation distribution  I have requested : 3D Simulation  I have requested a DVH of the following structures: Prostate and rectum.    ________________________________  Sheral Apley Tammi Klippel, M.D.

## 2020-12-22 NOTE — Progress Notes (Signed)
  Radiation Oncology         [phone] ________________________________  Name: Devon Barron MRN: [id number]  Date: 12/22/2020  DOB: 1961-04-29  SIMULATION AND TREATMENT PLANNING NOTE    ICD-10-CM   1. Malignant neoplasm of prostate ([place])  C61     DIAGNOSIS:  60 y.o. gentleman with Stage T1c adenocarcinoma of the prostate with Gleason score of 4+4, and PSA of 13.9.  NARRATIVE:  The patient was brought to the [facility].  Identity was confirmed.  All relevant records and images related to the planned course of therapy were reviewed.  The patient freely provided informed written consent to proceed with treatment after reviewing the details related to the planned course of therapy. The consent form was witnessed and verified by the simulation staff.  Then, the patient was set-up in a stable reproducible supine position for radiation therapy.  A vacuum lock pillow device was custom fabricated to position his legs in a reproducible immobilized position.  Then, I performed a urethrogram under sterile conditions to identify the prostatic apex.  CT images were obtained.  Surface markings were placed.  The CT images were loaded into the planning software.  Then the prostate target and avoidance structures including the rectum, bladder, bowel and hips were contoured.  Treatment planning then occurred.  The radiation prescription was entered and confirmed.  A total of one complex treatment devices were fabricated. I have requested : Intensity Modulated Radiotherapy (IMRT) is medically necessary for this case for the following reason:  Rectal sparing.Marland Kitchen  PLAN:  The patient will receive 45 Gy in 25 fractions of 1.8 Gy, to supplement an up-front prostate seed implant boost of 110 Gy to achieve a total nominal dose of 155 Gy.  ________________________________  Sheral Apley Tammi Klippel, M.D.  This document serves as a record of services personally performed by [name], MD. It was created  on his behalf by Wilburn Mylar, a trained medical scribe. The creation of this record is based on the scribe's personal observations and the provider's statements to them. This document has been checked and approved by the attending provider.

## 2020-12-25 DIAGNOSIS — C61 Malignant neoplasm of prostate: Secondary | ICD-10-CM | POA: Diagnosis not present

## 2020-12-29 ENCOUNTER — Other Ambulatory Visit: Payer: Self-pay | Admitting: Internal Medicine

## 2020-12-29 DIAGNOSIS — I10 Essential (primary) hypertension: Secondary | ICD-10-CM

## 2021-01-03 ENCOUNTER — Ambulatory Visit
Admission: RE | Admit: 2021-01-03 | Discharge: 2021-01-03 | Disposition: A | Discharge status: Home / Self Care | Payer: BC Managed Care – PPO | Source: Ambulatory Visit | Attending: Radiation Oncology | Admitting: Radiation Oncology

## 2021-01-03 ENCOUNTER — Other Ambulatory Visit: Payer: Self-pay | Admitting: Neurology

## 2021-01-03 DIAGNOSIS — C61 Malignant neoplasm of prostate: Secondary | ICD-10-CM | POA: Diagnosis not present

## 2021-01-04 ENCOUNTER — Ambulatory Visit
Admission: RE | Admit: 2021-01-04 | Discharge: 2021-01-04 | Disposition: A | Discharge status: Home / Self Care | Payer: BC Managed Care – PPO | Source: Ambulatory Visit | Attending: Radiation Oncology | Admitting: Radiation Oncology

## 2021-01-04 DIAGNOSIS — C61 Malignant neoplasm of prostate: Secondary | ICD-10-CM | POA: Diagnosis not present

## 2021-01-05 ENCOUNTER — Ambulatory Visit
Admission: RE | Admit: 2021-01-05 | Discharge: 2021-01-05 | Disposition: A | Discharge status: Home / Self Care | Payer: BC Managed Care – PPO | Source: Ambulatory Visit | Attending: Radiation Oncology | Admitting: Radiation Oncology

## 2021-01-05 ENCOUNTER — Other Ambulatory Visit: Payer: Self-pay

## 2021-01-05 DIAGNOSIS — C61 Malignant neoplasm of prostate: Secondary | ICD-10-CM | POA: Diagnosis not present

## 2021-01-08 ENCOUNTER — Ambulatory Visit
Admission: RE | Admit: 2021-01-08 | Discharge: 2021-01-08 | Disposition: A | Discharge status: Home / Self Care | Payer: BC Managed Care – PPO | Source: Ambulatory Visit | Attending: Radiation Oncology | Admitting: Radiation Oncology

## 2021-01-08 ENCOUNTER — Other Ambulatory Visit: Payer: Self-pay

## 2021-01-08 DIAGNOSIS — C61 Malignant neoplasm of prostate: Secondary | ICD-10-CM | POA: Diagnosis not present

## 2021-01-09 ENCOUNTER — Ambulatory Visit
Admission: RE | Admit: 2021-01-09 | Discharge: 2021-01-09 | Disposition: A | Discharge status: Home / Self Care | Payer: BC Managed Care – PPO | Source: Ambulatory Visit | Attending: Radiation Oncology | Admitting: Radiation Oncology

## 2021-01-09 DIAGNOSIS — C61 Malignant neoplasm of prostate: Secondary | ICD-10-CM | POA: Diagnosis not present

## 2021-01-10 ENCOUNTER — Ambulatory Visit
Admission: RE | Admit: 2021-01-10 | Discharge: 2021-01-10 | Disposition: A | Discharge status: Home / Self Care | Payer: BC Managed Care – PPO | Source: Ambulatory Visit | Attending: Radiation Oncology | Admitting: Radiation Oncology

## 2021-01-10 ENCOUNTER — Other Ambulatory Visit: Payer: Self-pay

## 2021-01-10 DIAGNOSIS — C61 Malignant neoplasm of prostate: Secondary | ICD-10-CM | POA: Diagnosis not present

## 2021-01-11 ENCOUNTER — Ambulatory Visit
Admission: RE | Admit: 2021-01-11 | Discharge: 2021-01-11 | Disposition: A | Discharge status: Home / Self Care | Payer: BC Managed Care – PPO | Source: Ambulatory Visit | Attending: Radiation Oncology | Admitting: Radiation Oncology

## 2021-01-11 DIAGNOSIS — C61 Malignant neoplasm of prostate: Secondary | ICD-10-CM | POA: Diagnosis not present

## 2021-01-12 ENCOUNTER — Ambulatory Visit
Admission: RE | Admit: 2021-01-12 | Discharge: 2021-01-12 | Disposition: A | Discharge status: Home / Self Care | Payer: BC Managed Care – PPO | Source: Ambulatory Visit | Attending: Radiation Oncology | Admitting: Radiation Oncology

## 2021-01-12 ENCOUNTER — Other Ambulatory Visit: Payer: Self-pay

## 2021-01-12 DIAGNOSIS — C61 Malignant neoplasm of prostate: Secondary | ICD-10-CM | POA: Diagnosis not present

## 2021-01-15 ENCOUNTER — Other Ambulatory Visit: Payer: Self-pay

## 2021-01-15 ENCOUNTER — Ambulatory Visit
Admission: RE | Admit: 2021-01-15 | Discharge: 2021-01-15 | Disposition: A | Discharge status: Home / Self Care | Payer: BC Managed Care – PPO | Source: Ambulatory Visit | Attending: Radiation Oncology | Admitting: Radiation Oncology

## 2021-01-15 DIAGNOSIS — C61 Malignant neoplasm of prostate: Secondary | ICD-10-CM | POA: Diagnosis not present

## 2021-01-16 ENCOUNTER — Ambulatory Visit
Admission: RE | Admit: 2021-01-16 | Discharge: 2021-01-16 | Disposition: A | Discharge status: Home / Self Care | Payer: BC Managed Care – PPO | Source: Ambulatory Visit | Attending: Radiation Oncology | Admitting: Radiation Oncology

## 2021-01-16 DIAGNOSIS — C61 Malignant neoplasm of prostate: Secondary | ICD-10-CM | POA: Diagnosis not present

## 2021-01-17 ENCOUNTER — Ambulatory Visit
Admission: RE | Admit: 2021-01-17 | Discharge: 2021-01-17 | Disposition: A | Discharge status: Home / Self Care | Payer: BC Managed Care – PPO | Source: Ambulatory Visit | Attending: Radiation Oncology | Admitting: Radiation Oncology

## 2021-01-17 DIAGNOSIS — C61 Malignant neoplasm of prostate: Secondary | ICD-10-CM | POA: Diagnosis not present

## 2021-01-18 ENCOUNTER — Other Ambulatory Visit: Payer: Self-pay

## 2021-01-18 ENCOUNTER — Ambulatory Visit
Admission: RE | Admit: 2021-01-18 | Discharge: 2021-01-18 | Disposition: A | Discharge status: Home / Self Care | Payer: BC Managed Care – PPO | Source: Ambulatory Visit | Attending: Radiation Oncology | Admitting: Radiation Oncology

## 2021-01-18 DIAGNOSIS — C61 Malignant neoplasm of prostate: Secondary | ICD-10-CM | POA: Diagnosis not present

## 2021-01-19 ENCOUNTER — Ambulatory Visit
Admission: RE | Admit: 2021-01-19 | Discharge: 2021-01-19 | Disposition: A | Discharge status: Home / Self Care | Payer: BC Managed Care – PPO | Source: Ambulatory Visit | Attending: Radiation Oncology | Admitting: Radiation Oncology

## 2021-01-19 DIAGNOSIS — C61 Malignant neoplasm of prostate: Secondary | ICD-10-CM | POA: Diagnosis not present

## 2021-01-22 ENCOUNTER — Other Ambulatory Visit: Payer: Self-pay

## 2021-01-22 ENCOUNTER — Ambulatory Visit
Admission: RE | Admit: 2021-01-22 | Discharge: 2021-01-22 | Disposition: A | Discharge status: Home / Self Care | Payer: BC Managed Care – PPO | Source: Ambulatory Visit | Attending: Radiation Oncology | Admitting: Radiation Oncology

## 2021-01-22 DIAGNOSIS — C61 Malignant neoplasm of prostate: Secondary | ICD-10-CM | POA: Diagnosis not present

## 2021-01-23 ENCOUNTER — Ambulatory Visit
Admission: RE | Admit: 2021-01-23 | Discharge: 2021-01-23 | Disposition: A | Discharge status: Home / Self Care | Payer: BC Managed Care – PPO | Source: Ambulatory Visit | Attending: Radiation Oncology | Admitting: Radiation Oncology

## 2021-01-23 DIAGNOSIS — C61 Malignant neoplasm of prostate: Secondary | ICD-10-CM | POA: Diagnosis not present

## 2021-01-24 ENCOUNTER — Other Ambulatory Visit: Payer: Self-pay

## 2021-01-24 ENCOUNTER — Ambulatory Visit
Admission: RE | Admit: 2021-01-24 | Discharge: 2021-01-24 | Disposition: A | Discharge status: Home / Self Care | Payer: BC Managed Care – PPO | Source: Ambulatory Visit | Attending: Radiation Oncology | Admitting: Radiation Oncology

## 2021-01-24 ENCOUNTER — Encounter: Payer: Self-pay | Admitting: Radiation Oncology

## 2021-01-24 DIAGNOSIS — C61 Malignant neoplasm of prostate: Secondary | ICD-10-CM | POA: Diagnosis not present

## 2021-01-25 ENCOUNTER — Ambulatory Visit: Payer: BC Managed Care – PPO | Admitting: Radiation Oncology

## 2021-01-25 ENCOUNTER — Ambulatory Visit
Admission: RE | Admit: 2021-01-25 | Discharge: 2021-01-25 | Disposition: A | Discharge status: Home / Self Care | Payer: BC Managed Care – PPO | Source: Ambulatory Visit | Attending: Radiation Oncology | Admitting: Radiation Oncology

## 2021-01-25 DIAGNOSIS — C61 Malignant neoplasm of prostate: Secondary | ICD-10-CM | POA: Diagnosis not present

## 2021-01-26 ENCOUNTER — Encounter: Payer: Self-pay | Admitting: Internal Medicine

## 2021-01-26 ENCOUNTER — Ambulatory Visit
Admission: RE | Admit: 2021-01-26 | Discharge: 2021-01-26 | Disposition: A | Discharge status: Home / Self Care | Payer: BC Managed Care – PPO | Source: Ambulatory Visit | Attending: Radiation Oncology | Admitting: Radiation Oncology

## 2021-01-26 ENCOUNTER — Other Ambulatory Visit: Payer: Self-pay

## 2021-01-26 DIAGNOSIS — C61 Malignant neoplasm of prostate: Secondary | ICD-10-CM | POA: Diagnosis not present

## 2021-01-26 NOTE — Telephone Encounter (Signed)
   Patient is requesting an appointment next week. Pcp first available is 8/1. Is it okay to schedule in a buffer?

## 2021-01-29 ENCOUNTER — Other Ambulatory Visit: Payer: Self-pay

## 2021-01-29 ENCOUNTER — Ambulatory Visit
Admission: RE | Admit: 2021-01-29 | Discharge: 2021-01-29 | Disposition: A | Discharge status: Home / Self Care | Payer: BC Managed Care – PPO | Source: Ambulatory Visit | Attending: Radiation Oncology | Admitting: Radiation Oncology

## 2021-01-29 DIAGNOSIS — C61 Malignant neoplasm of prostate: Secondary | ICD-10-CM | POA: Diagnosis not present

## 2021-01-30 ENCOUNTER — Ambulatory Visit
Admission: RE | Admit: 2021-01-30 | Discharge: 2021-01-30 | Disposition: A | Discharge status: Home / Self Care | Payer: BC Managed Care – PPO | Source: Ambulatory Visit | Attending: Radiation Oncology | Admitting: Radiation Oncology

## 2021-01-30 DIAGNOSIS — C61 Malignant neoplasm of prostate: Secondary | ICD-10-CM | POA: Diagnosis not present

## 2021-01-31 ENCOUNTER — Ambulatory Visit
Admission: RE | Admit: 2021-01-31 | Discharge: 2021-01-31 | Disposition: A | Discharge status: Home / Self Care | Payer: BC Managed Care – PPO | Source: Ambulatory Visit | Attending: Radiation Oncology | Admitting: Radiation Oncology

## 2021-01-31 DIAGNOSIS — C61 Malignant neoplasm of prostate: Secondary | ICD-10-CM | POA: Diagnosis not present

## 2021-01-31 NOTE — Progress Notes (Signed)
  Radiation Oncology         [phone] ________________________________  Name: BLESSING OZGA MRN: [id number]  Date: 01/24/2021  DOB: 07-10-1961  3D Planning Note   Prostate Brachytherapy Post-Implant Dosimetry  Diagnosis: 60 y.o. gentleman with Stage T1c adenocarcinoma of the prostate with Gleason score of 4+4, and PSA of 13.9.  Narrative: On a previous date, Devon Barron returned following prostate seed implantation for post implant planning. He underwent CT scan complex simulation to delineate the three-dimensional structures of the pelvis and demonstrate the radiation distribution.  Since that time, the seed localization, and complex isodose planning with dose volume histograms have now been completed.  Results:   Prostate Coverage - The dose of radiation delivered to the 90% or more of the prostate gland (D90) was 115.47% of the prescription dose. This exceeds our goal of greater than 90%. Rectal Sparing - The volume of rectal tissue receiving the prescription dose or higher was 0.0 cc. This falls under our thresholds tolerance of 1.0 cc.  Impression: The prostate seed implant appears to show adequate target coverage and appropriate rectal sparing.  Plan:  The patient will continue to follow with urology for ongoing PSA determinations. I would anticipate a high likelihood for local tumor control with minimal risk for rectal morbidity.  ________________________________  Sheral Apley Tammi Klippel, M.D.

## 2021-02-01 ENCOUNTER — Ambulatory Visit
Admission: RE | Admit: 2021-02-01 | Discharge: 2021-02-01 | Disposition: A | Discharge status: Home / Self Care | Payer: BC Managed Care – PPO | Source: Ambulatory Visit | Attending: Radiation Oncology | Admitting: Radiation Oncology

## 2021-02-01 ENCOUNTER — Other Ambulatory Visit: Payer: Self-pay

## 2021-02-01 DIAGNOSIS — C61 Malignant neoplasm of prostate: Secondary | ICD-10-CM | POA: Diagnosis not present

## 2021-02-02 ENCOUNTER — Ambulatory Visit
Admission: RE | Admit: 2021-02-02 | Discharge: 2021-02-02 | Disposition: A | Discharge status: Home / Self Care | Payer: BC Managed Care – PPO | Source: Ambulatory Visit | Attending: Radiation Oncology | Admitting: Radiation Oncology

## 2021-02-02 DIAGNOSIS — C61 Malignant neoplasm of prostate: Secondary | ICD-10-CM | POA: Insufficient documentation

## 2021-02-06 ENCOUNTER — Other Ambulatory Visit: Payer: Self-pay

## 2021-02-06 ENCOUNTER — Ambulatory Visit
Admission: RE | Admit: 2021-02-06 | Discharge: 2021-02-06 | Disposition: A | Discharge status: Home / Self Care | Payer: BC Managed Care – PPO | Source: Ambulatory Visit | Attending: Radiation Oncology | Admitting: Radiation Oncology

## 2021-02-06 DIAGNOSIS — C61 Malignant neoplasm of prostate: Secondary | ICD-10-CM | POA: Diagnosis not present

## 2021-02-07 ENCOUNTER — Ambulatory Visit
Admission: RE | Admit: 2021-02-07 | Discharge: 2021-02-07 | Disposition: A | Discharge status: Home / Self Care | Payer: BC Managed Care – PPO | Source: Ambulatory Visit | Attending: Radiation Oncology | Admitting: Radiation Oncology

## 2021-02-07 ENCOUNTER — Encounter: Payer: Self-pay | Admitting: Urology

## 2021-02-07 DIAGNOSIS — C61 Malignant neoplasm of prostate: Secondary | ICD-10-CM

## 2021-02-09 ENCOUNTER — Other Ambulatory Visit: Payer: Self-pay | Admitting: Neurology

## 2021-02-09 NOTE — Telephone Encounter (Signed)
Tried calling pt, No answer.  Please contact patient - he needs to schedule follow up next available - will need MRI of brain with and without contrast and repeat CBC with diff and CMPprior to the follow up.  Once the follow up is scheduled, we can refill it

## 2021-02-10 ENCOUNTER — Emergency Department (HOSPITAL_COMMUNITY)
Admission: EM | Admit: 2021-02-10 | Discharge: 2021-02-10 | Disposition: A | Discharge status: Home / Self Care | Payer: BC Managed Care – PPO | Attending: Emergency Medicine | Admitting: Emergency Medicine

## 2021-02-10 ENCOUNTER — Other Ambulatory Visit: Payer: Self-pay

## 2021-02-10 ENCOUNTER — Encounter (HOSPITAL_COMMUNITY): Payer: Self-pay | Admitting: *Deleted

## 2021-02-10 DIAGNOSIS — N4889 Other specified disorders of penis: Secondary | ICD-10-CM | POA: Insufficient documentation

## 2021-02-10 DIAGNOSIS — Z8546 Personal history of malignant neoplasm of prostate: Secondary | ICD-10-CM | POA: Insufficient documentation

## 2021-02-10 DIAGNOSIS — I10 Essential (primary) hypertension: Secondary | ICD-10-CM | POA: Diagnosis not present

## 2021-02-10 DIAGNOSIS — E1169 Type 2 diabetes mellitus with other specified complication: Secondary | ICD-10-CM | POA: Diagnosis not present

## 2021-02-10 DIAGNOSIS — Z7984 Long term (current) use of oral hypoglycemic drugs: Secondary | ICD-10-CM | POA: Diagnosis not present

## 2021-02-10 LAB — URINALYSIS, ROUTINE W REFLEX MICROSCOPIC
Bilirubin Urine: NEGATIVE
Glucose, UA: 150 mg/dL — AB
Hgb urine dipstick: NEGATIVE
Ketones, ur: 20 mg/dL — AB
Leukocytes,Ua: NEGATIVE
Nitrite: NEGATIVE
Protein, ur: NEGATIVE mg/dL
Specific Gravity, Urine: 1.029 (ref 1.005–1.030)
pH: 5 (ref 5.0–8.0)

## 2021-02-10 LAB — RPR: RPR Ser Ql: NONREACTIVE

## 2021-02-10 MED ORDER — HYDROCODONE-ACETAMINOPHEN 5-325 MG PO TABS: 1.0000 | ORAL_TABLET | Freq: Four times a day (QID) | ORAL | 0 refills | Status: DC | PRN

## 2021-02-10 NOTE — ED Provider Notes (Addendum)
[facility] EMERGENCY DEPARTMENT Provider Note   CSN: [id number] Arrival date & time: 02/10/21  0413     History Chief Complaint  Patient presents with   Penile Discharge    Devon Barron is a 60 y.o. male.  HPI Patient is a 60 year old male with a history of type 2 diabetes, multiple sclerosis, prostate cancer, who presents to the emergency department due to penile pain for the past 3 to 4 months.  Patient states that he has had similar symptoms for many years but about 3 to 4 months ago they began worsening once again.  He states that he has taken acyclovir in the past for symptoms with no improvement and denies having had a positive HSV test in the past.  He attempted to follow-up with his regular doctor this week but was told that he was out of the office so he came to the emergency department for evaluation.  He reports a small point of pain and redness to the shaft of the penis but otherwise has no other GU complaints.  No dysuria, hematuria, penile discharge, penile swelling, scrotal swelling.  Denies any systemic symptoms such as fevers, chills, nausea, or vomiting.  He states he has not been sexually active for the past 2 to 3 years.    Past Medical History:  Diagnosis Date   DM type 2 (diabetes mellitus, type 2) ([place])    Hyperlipidemia    MS (multiple sclerosis) ([place])    Prostate cancer [facility])     Patient Active Problem List   Diagnosis Date Noted   Abnormal electrocardiogram (ECG) (EKG) 10/05/2020   Type II diabetes mellitus with manifestations ([place]) 10/05/2020   Malignant neoplasm of prostate ([place]) 10/03/2020   Essential hypertension 03/31/2018   Hyperlipidemia LDL goal <100 03/31/2018   Prediabetes 03/31/2018   Hypogonadism in male 03/24/2018   Erectile dysfunction 03/21/2014   Herpes genitalis in men 03/21/2014   STD (sexually transmitted disease) 03/21/2014   GERD (gastroesophageal reflux disease) 09/17/2013   Cervical radiculitis 09/17/2013    Routine general medical examination at a health care facility 09/03/2013   Nonspecific abnormal electrocardiogram (ECG) (EKG) 09/03/2013   Relapsing remitting multiple sclerosis ([place]) 07/28/2013    Past Surgical History:  Procedure Laterality Date   CYSTOSCOPY  12/07/2020   Procedure: CYSTOSCOPY FLEXIBLE;  Surgeon: Ardis Hughs, MD;  Location: [facility];  Service: Urology;;   DEVIATED SETUM SURGERY  25 YRS AGO   HERNIA REPAIR  03/2013   INGUINAL   PROSTATE BIOPSY     RADIOACTIVE SEED IMPLANT N/A 12/07/2020   Procedure: RADIOACTIVE SEED IMPLANT/BRACHYTHERAPY IMPLANT;  Surgeon: Ardis Hughs, MD;  Location: [facility];  Service: Urology;  Laterality: N/A;   SPACE OAR INSTILLATION N/A 12/07/2020   Procedure: SPACE OAR INSTILLATION;  Surgeon: Ardis Hughs, MD;  Location: [facility];  Service: Urology;  Laterality: N/A;       Family History  Problem Relation Age of Onset   Hypertension Mother    Ovarian cancer Mother    Hypertension Father    Prostate cancer Father    Diabetes Sister    Diabetes Brother    Ataxia Neg Hx    Chorea Neg Hx    Dementia Neg Hx    Mental retardation Neg Hx    Migraines Neg Hx    Multiple sclerosis Neg Hx    Neurofibromatosis Neg Hx    Neuropathy Neg Hx    Parkinsonism Neg  Hx    Seizures Neg Hx    Stroke Neg Hx    Early death Neg Hx    Cancer Neg Hx    Alcohol abuse Neg Hx    Hearing loss Neg Hx    Heart disease Neg Hx    Hyperlipidemia Neg Hx    Kidney disease Neg Hx    Breast cancer Neg Hx    Colon cancer Neg Hx    Pancreatic cancer Neg Hx     Social History   Tobacco Use   Smoking status: Never   Smokeless tobacco: Never  Vaping Use   Vaping Use: Never used  Substance Use Topics   Alcohol use: Yes    Alcohol/week: 5.0 standard drinks    Types: 5 Cans of beer per week    Comment: Occ   Drug use: No    Home Medications Prior to Admission medications   Medication  Sig Start Date End Date Taking? Authorizing Provider  cholecalciferol (VITAMIN D) 1000 UNITS tablet Take 1,000 Units by mouth daily. VIT D 3 2000 UNITS TAKES 2 TABS = 4000 UNITS   Yes [provider]  clobetasol cream (TEMOVATE) 2.94 % Apply 1 application topically daily as needed (itching).   Yes [provider]  diazepam (VALIUM) 10 MG tablet Take 10 mg by mouth at bedtime.   Yes [provider]  Empagliflozin-metFORMIN HCl (SYNJARDY) 5-500 MG TABS Take 1 tablet by mouth 2 (two) times daily. 10/05/20  Yes Janith Lima, MD  HYDROcodone-acetaminophen (NORCO/VICODIN) 5-325 MG tablet Take 1 tablet by mouth every 6 (six) hours as needed. 02/10/21  Yes Rayna Sexton, PA-C  indapamide (LOZOL) 1.25 MG tablet TAKE 1 TABLET(1.25 MG) BY MOUTH DAILY Patient taking differently: Take 1.25 mg by mouth daily. 12/29/20  Yes Janith Lima, MD  interferon beta-1a (REBIF) 44 MCG/0.5ML SOSY injection INJECT ONE SYRINGE SUBCUTANEOUSLY THREE TIMES PER WEEK. KEEP REFRIGERATED. Patient taking differently: Inject 44 mcg into the skin 3 (three) times a week. INJECT ONE SYRINGE SUBCUTANEOUSLY THREE TIMES PER WEEK. KEEP REFRIGERATED. Sunday Tuesday THURSDAY 08/24/20  Yes Pieter Partridge, DO  meloxicam (MOBIC) 15 MG tablet Take 15 mg by mouth daily.   Yes [provider]  Multiple Vitamins-Minerals (MULTIVITAMIN WITH MINERALS) tablet Take 1 tablet by mouth daily.   Yes [provider]  tamsulosin (FLOMAX) 0.4 MG CAPS capsule Take 1 capsule (0.4 mg total) by mouth daily. 12/07/20  Yes Ardis Hughs, MD  rosuvastatin (CRESTOR) 10 MG tablet Take 1 tablet (10 mg total) by mouth daily. Patient not taking: Reported on 02/10/2021 10/05/20   Janith Lima, MD   Allergies    Patient has no known allergies.  Review of Systems   Review of Systems  Constitutional:  Negative for fever.  Gastrointestinal:  Negative for nausea and vomiting.  Genitourinary:  Positive for penile pain.  Negative for difficulty urinating, dysuria, frequency, hematuria, penile discharge, penile swelling, scrotal swelling and testicular pain.   Physical Exam Updated Vital Signs BP 132/89   Pulse (!) 114   Temp 99.3 F (37.4 C)   Resp 18   Ht 5\' 9"  (1.753 m)   Wt 87.1 kg   SpO2 99%   BMI 28.35 kg/m   Physical Exam Vitals and nursing note reviewed.  Constitutional:      General: He is not in acute distress.    Appearance: He is well-developed.  HENT:     Head: Normocephalic and atraumatic.     Right  Ear: External ear normal.     Left Ear: External ear normal.  Eyes:     General: No scleral icterus.       Right eye: No discharge.        Left eye: No discharge.     Conjunctiva/sclera: Conjunctivae normal.  Neck:     Trachea: No tracheal deviation.  Cardiovascular:     Rate and Rhythm: Normal rate.  Pulmonary:     Effort: Pulmonary effort is normal. No respiratory distress.     Breath sounds: No stridor.  Abdominal:     General: There is no distension.  Genitourinary:    Comments: Chaperone present.  Small point of erythema and tenderness along the penile shaft proximal to the glans.  No obvious lesions.  No discharge noted.  No other regions of pain or tenderness.  No lymphadenopathy. Musculoskeletal:        General: No swelling or deformity.     Cervical back: Neck supple.  Skin:    General: Skin is warm and dry.     Findings: No rash.  Neurological:     Mental Status: He is alert.     Cranial Nerves: Cranial nerve deficit: no gross deficits.   ED Results / Procedures / Treatments   Labs (all labs ordered are listed, but only abnormal results are displayed) Labs Reviewed  URINALYSIS, ROUTINE W REFLEX MICROSCOPIC - Abnormal; Notable for the following components:      Result Value   APPearance HAZY (*)    Glucose, UA 150 (*)    Ketones, ur 20 (*)    All other components within normal limits  RPR  GC/CHLAMYDIA PROBE AMP ([place]) NOT AT [facility]     EKG None  Radiology No results found.  Procedures Procedures   Medications Ordered in ED Medications - No data to display  ED Course  I have reviewed the triage vital signs and the nursing notes.  Pertinent labs & imaging results that were available during my care of the patient were reviewed by me and considered in my medical decision making (see chart for details).    MDM Rules/Calculators/A&P                          Pt is a 60 y.o. male who presents to the emergency department with penile pain for the past 3 to 4 months.  Labs: UA showing 150 glucose as well as 20 ketones.  I, Rayna Sexton, PA-C, personally reviewed and evaluated these images and lab results as part of my medical decision-making.  Unsure the source of the patient's symptoms.  He does have a small point of erythema to the shaft of the penis just proximal to the glans but I do not see any lesions or swelling.  Appears to be slightly irritated in the region.  No bleeding.  No penile discharge.  No other regions of tenderness noted along the penis or scrotum.  No lymphadenopathy.  Per patient, he has not been sexually active for nearly 3 years.   I obtained a UA which was reassuring.  GC/chlamydia as well as RPR is pending.  Patient is going to check these results on MyChart.  Also recommended he follow-up with his PCP next week regarding his results and for reassessment.  Recommended he refrain from sexual activity until he receives results of these tests.  Patient has been taking OTC pain meds with little relief.  Will discharge on a  very short course of Vicodin for breakthrough pain.  We discussed safety regarding this medication.  Given strict return precautions.  Feel that he is stable for discharge at this time and he is agreeable.  His questions were answered and he was amicable at the time of discharge.  Note: Portions of this report may have been transcribed using voice recognition software. Every  effort was made to ensure accuracy; however, inadvertent computerized transcription errors may be present.   Final Clinical Impression(s) / ED Diagnoses Final diagnoses:  Penile pain    Rx / DC Orders ED Discharge Orders          Ordered    HYDROcodone-acetaminophen (NORCO/VICODIN) 5-325 MG tablet  Every 6 hours PRN        02/10/21 0752             Rayna Sexton, PA-C 02/10/21 0758    Rayna Sexton, PA-C 02/10/21 0800    Fredia Sorrow, MD 02/11/21 [phone]

## 2021-02-10 NOTE — ED Triage Notes (Signed)
Patient  presents to Ed c/o c/o pain in his penis onset 3-4 months ago, states there is a small sore on the tip of his penis states the pain was unbearable

## 2021-02-10 NOTE — Discharge Instructions (Addendum)
I have prescribed you a strong narcotic called Vicodin. Please only take this as prescribed. This medication also has tylenol in it, so please be sure you are not taking more than 3000 mg of tylenol per day. Do not drive or operate heavy machinery after taking this medication. Do not mix it with alcohol.   Please check the results of your RPR as well as gonorrhea/chlamydia tests on MyChart.  The RPR is a test used for diagnosing syphilis. Please refrain from sexual intercourse until you receive these results and be sure that they are negative before having sex again.  Please follow-up with your regular doctor next week to be reevaluated.  If you develop new or worsening symptoms please come back to the emergency department.  It was a pleasure to meet you.

## 2021-02-12 LAB — GC/CHLAMYDIA PROBE AMP (~~LOC~~) NOT AT ARMC
Chlamydia: NEGATIVE
Comment: NEGATIVE
Comment: NORMAL
Neisseria Gonorrhea: NEGATIVE

## 2021-02-13 ENCOUNTER — Encounter: Payer: Self-pay | Admitting: Internal Medicine

## 2021-02-13 ENCOUNTER — Other Ambulatory Visit: Payer: Self-pay

## 2021-02-13 ENCOUNTER — Ambulatory Visit: Payer: BC Managed Care – PPO | Admitting: Internal Medicine

## 2021-02-13 VITALS — BP 128/86 | HR 114 | Temp 98.2°F | Resp 16 | Ht 69.0 in | Wt 193.0 lb

## 2021-02-13 DIAGNOSIS — D696 Thrombocytopenia, unspecified: Secondary | ICD-10-CM

## 2021-02-13 DIAGNOSIS — R000 Tachycardia, unspecified: Secondary | ICD-10-CM

## 2021-02-13 DIAGNOSIS — E118 Type 2 diabetes mellitus with unspecified complications: Secondary | ICD-10-CM | POA: Diagnosis not present

## 2021-02-13 DIAGNOSIS — I10 Essential (primary) hypertension: Secondary | ICD-10-CM | POA: Diagnosis not present

## 2021-02-13 DIAGNOSIS — C61 Malignant neoplasm of prostate: Secondary | ICD-10-CM

## 2021-02-13 DIAGNOSIS — R6889 Other general symptoms and signs: Secondary | ICD-10-CM

## 2021-02-13 DIAGNOSIS — D539 Nutritional anemia, unspecified: Secondary | ICD-10-CM

## 2021-02-13 DIAGNOSIS — G893 Neoplasm related pain (acute) (chronic): Secondary | ICD-10-CM

## 2021-02-13 DIAGNOSIS — R10815 Periumbilic abdominal tenderness: Secondary | ICD-10-CM | POA: Diagnosis not present

## 2021-02-13 DIAGNOSIS — N410 Acute prostatitis: Secondary | ICD-10-CM

## 2021-02-13 MED ORDER — HYDROCODONE-ACETAMINOPHEN 5-325 MG PO TABS: 1.0000 | ORAL_TABLET | ORAL | 0 refills | Status: AC | PRN

## 2021-02-13 NOTE — Progress Notes (Signed)
NEUROLOGY FOLLOW UP OFFICE NOTE  Devon Barron [id number]  Assessment/Plan:   Multiple sclerosis  DMT:  Rebif D3 4000 IU daily CBC with diff, CMP and vit D today and again in 6 months Follow up 6 months.  Subjective:  Devon Barron is a 60 year old right-handed man who follows up for multiple sclerosis   UPDATE: Current disease modifying therapy: Rebif Other current medications: D3 4000 IU daily  Lab from 10/05/2020:  Vit D 43.50 Labs from 12/04/2020:  CBC with WBC 5.9, HGB 13.1, HCT 40.8, PLT 90, ALC 1.1; CMP with Na 138, K 3.3, Cl 102, Co2 26, glucose 126, BUN 16, Cr 0.95, Ca 9.9, t bili 0.8, ALP 51, AST 26, ALT 30.   MRI of brain with and without contrast on 11/20/2020 stable compared to prior imaging from 12/02/2019.  Vision: No issues Motor: No issues Sensory: No issues Pain: Lower abdominal pain being worked up by PCP Gait: No issues Bowel/Bladder: No issues Fatigue: Some Cognition: No issues Mood: Denies depression or anxiety   HISTORY: He was diagnosed with MS in 2001.  At that time, he presented with left facial myokymia. He reports that he hasn't had many flare ups, and only a couple of flare-ups requiring pulse steroids.  They typically present as blurred vision, headache, dizziness, unsteadiness and fatigue.  He has both supratentorial and infratentorial demyelinating lesions.  His cervical cord is okay.  He has a history of lapses with physician follow-ups and non-adherence to disease-modifying agents, mostly due to financial reasons.  He previously was on Avonex, and then Rebif.  Due to financial reasons, he has not followed up with a neurologist in between 2013 and 2014 and had not taken his Rebif.  He developed increased fatigue, stiffness, blurred vision and intermittent headaches, similar to prior flare ups.  He presented to the ED on 05/03/13.  Physical exam revealed slight right sided facial droop.  Initially, a CT of the head was performed and revealed decreased  attenuation at the right subfrontal-anterior temporal junction.  MRI Brain w/wo contrast was performed, and revealed extensive periventricular and subcortical T2 hyperintensities, as well as focal restricted diffusion within the splenium of the corpus callosum, concerning for active demyelination.  He has since restarted Rebif.   MRI of brain with and without contrast from 11/03/16 was stable compared to prior MRIs from 02/14/15, 03/08/14 and 07/13/13.  MRI of brain with and without contrast from 12/02/19 showed mildly progressed multifocal posterior frontal and parietal lobe cortical involvement appears mildly progressed since 2018.  Evidence of chronic plaque in dorsal upper spinal cord at C2.   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  PAST MEDICAL HISTORY: Past Medical History:  Diagnosis Date   DM type 2 (diabetes mellitus, type 2) ([place])    Hyperlipidemia    MS (multiple sclerosis) ([place])    Prostate cancer ([place])     MEDICATIONS: Current Outpatient Medications on File Prior to Visit  Medication Sig Dispense Refill   cholecalciferol (VITAMIN D) 1000 UNITS tablet Take 1,000 Units by mouth daily. VIT D 3 2000 UNITS TAKES 2 TABS = 4000 UNITS     clobetasol cream (TEMOVATE) 6.06 % Apply 1 application topically daily as needed (itching).     diazepam (VALIUM) 10 MG tablet Take 10 mg by mouth at bedtime.     Empagliflozin-metFORMIN HCl (SYNJARDY) 5-500 MG TABS Take 1 tablet by mouth 2 (two) times daily. 180 tablet 0   HYDROcodone-acetaminophen (NORCO/VICODIN)  5-325 MG tablet Take 1 tablet by mouth every 6 (six) hours as needed. 5 tablet 0   indapamide (LOZOL) 1.25 MG tablet TAKE 1 TABLET(1.25 MG) BY MOUTH DAILY (Patient taking differently: Take 1.25 mg by mouth daily.) 90 tablet 1   interferon beta-1a (REBIF) 44 MCG/0.5ML SOSY injection INJECT ONE SYRINGE SUBCUTANEOUSLY THREE TIMES PER WEEK. KEEP REFRIGERATED. (Patient taking differently: Inject 44 mcg into the skin 3 (three)  times a week. INJECT ONE SYRINGE SUBCUTANEOUSLY THREE TIMES PER WEEK. KEEP REFRIGERATED. Sunday Tuesday THURSDAY) 0.5 mL 2   meloxicam (MOBIC) 15 MG tablet Take 15 mg by mouth daily.     Multiple Vitamins-Minerals (MULTIVITAMIN WITH MINERALS) tablet Take 1 tablet by mouth daily.     rosuvastatin (CRESTOR) 10 MG tablet Take 1 tablet (10 mg total) by mouth daily. (Patient not taking: Reported on 02/10/2021) 90 tablet 1   tamsulosin (FLOMAX) 0.4 MG CAPS capsule Take 1 capsule (0.4 mg total) by mouth daily. 7 capsule 0   No current facility-administered medications on file prior to visit.    ALLERGIES: No Known Allergies  FAMILY HISTORY: Family History  Problem Relation Age of Onset   Hypertension Mother    Ovarian cancer Mother    Hypertension Father    Prostate cancer Father    Diabetes Sister    Diabetes Brother    Ataxia Neg Hx    Chorea Neg Hx    Dementia Neg Hx    Mental retardation Neg Hx    Migraines Neg Hx    Multiple sclerosis Neg Hx    Neurofibromatosis Neg Hx    Neuropathy Neg Hx    Parkinsonism Neg Hx    Seizures Neg Hx    Stroke Neg Hx    Early death Neg Hx    Cancer Neg Hx    Alcohol abuse Neg Hx    Hearing loss Neg Hx    Heart disease Neg Hx    Hyperlipidemia Neg Hx    Kidney disease Neg Hx    Breast cancer Neg Hx    Colon cancer Neg Hx    Pancreatic cancer Neg Hx       Objective:  Blood pressure 118/80, pulse (!) 109, resp. rate 18, height 5\' 9"  (1.753 m), weight 193 lb (87.5 kg), SpO2 97 %. General: No acute distress.  Patient appears well-groomed.   Head:  Normocephalic/atraumatic Eyes:  Fundi examined but not visualized Neck: supple, no paraspinal tenderness, full range of motion Heart:  Regular rate and rhythm Lungs:  Clear to auscultation bilaterally Back: No paraspinal tenderness Neurological Exam: alert and oriented to person, place, and time.  Speech fluent and not dysarthric, language intact.  CN II-XII intact. Bulk and tone normal, muscle  strength 5/5 throughout.  Sensation to temperature and vibratory sensation intact.  Deep tendon reflexes 2+ throughout, toes downgoing.  Finger to nose testing intact.  Gait normal, Romberg negative.   Metta Clines, DO  CC: Scarlette Calico, MD

## 2021-02-13 NOTE — Patient Instructions (Signed)

## 2021-02-13 NOTE — Progress Notes (Signed)
pro  Subjective:  Patient ID: Devon Barron, male    DOB: [birth year]  Age: 60 y.o. MRN: [id number]  CC: Abdominal Pain and Diabetes  This visit occurred during the SARS-CoV-2 public health emergency.  Safety protocols were in place, including screening questions prior to the visit, additional usage of staff PPE, and extensive cleaning of exam room while observing appropriate contact time as indicated for disinfecting solutions.    HPI Devon Barron presents for f/up -  He complains of a 3 to 6-month history of pelvic and abdominal pain.  He is being treated for prostate cancer with radiation therapy.  He tells me he saw a urologist and was diagnosed with herpes.  He took the antiviral medication without much improvement.  He complains of weight loss but denies nausea, vomiting, dysuria, or hematuria.  He has had some rectal discomfort and bright red blood per rectum.  He was recently seen in the ED and tells me that hydrocodone and acetaminophen helped the pain but he needs a refill. stronger.  Outpatient Medications Prior to Visit  Medication Sig Dispense Refill   cholecalciferol (VITAMIN D) 1000 UNITS tablet Take 1,000 Units by mouth daily. VIT D 3 2000 UNITS TAKES 2 TABS = 4000 UNITS     clobetasol cream (TEMOVATE) 9.89 % Apply 1 application topically daily as needed (itching).     Empagliflozin-metFORMIN HCl (SYNJARDY) 5-500 MG TABS Take 1 tablet by mouth 2 (two) times daily. 180 tablet 0   indapamide (LOZOL) 1.25 MG tablet TAKE 1 TABLET(1.25 MG) BY MOUTH DAILY (Patient taking differently: Take 1.25 mg by mouth daily.) 90 tablet 1   interferon beta-1a (REBIF) 44 MCG/0.5ML SOSY injection INJECT ONE SYRINGE SUBCUTANEOUSLY THREE TIMES PER WEEK. KEEP REFRIGERATED. (Patient taking differently: Inject 44 mcg into the skin 3 (three) times a week. INJECT ONE SYRINGE SUBCUTANEOUSLY THREE TIMES PER WEEK. KEEP REFRIGERATED. Sunday Tuesday THURSDAY) 0.5 mL 2   meloxicam (MOBIC) 15 MG tablet Take 15 mg  by mouth daily.     Multiple Vitamins-Minerals (MULTIVITAMIN WITH MINERALS) tablet Take 1 tablet by mouth daily.     rosuvastatin (CRESTOR) 10 MG tablet Take 1 tablet (10 mg total) by mouth daily. 90 tablet 1   tamsulosin (FLOMAX) 0.4 MG CAPS capsule Take 1 capsule (0.4 mg total) by mouth daily. 7 capsule 0   diazepam (VALIUM) 10 MG tablet Take 10 mg by mouth at bedtime.     HYDROcodone-acetaminophen (NORCO/VICODIN) 5-325 MG tablet Take 1 tablet by mouth every 6 (six) hours as needed. 5 tablet 0   No facility-administered medications prior to visit.    ROS Review of Systems  Constitutional:  Positive for unexpected weight change (wt loss). Negative for chills, diaphoresis, fatigue and fever.  HENT: Negative.    Eyes: Negative.   Respiratory:  Negative for cough, chest tightness, wheezing and stridor.   Cardiovascular:  Negative for chest pain, palpitations and leg swelling.  Gastrointestinal:  Positive for abdominal pain, anal bleeding, blood in stool and rectal pain. Negative for abdominal distention, diarrhea, nausea and vomiting.  Endocrine: Positive for cold intolerance. Negative for heat intolerance, polydipsia, polyphagia and polyuria.  Genitourinary:  Negative for difficulty urinating, dysuria, genital sores, hematuria, penile discharge, penile pain, penile swelling, scrotal swelling, testicular pain and urgency.  Musculoskeletal: Negative.   Skin: Negative.   Neurological: Negative.  Negative for dizziness, weakness, light-headedness, numbness and headaches.  Hematological:  Negative for adenopathy. Does not bruise/bleed easily.  Psychiatric/Behavioral:  Positive for sleep disturbance. Negative for  dysphoric mood and suicidal ideas.    Objective:  BP 128/86 (BP Location: Right Arm, Patient Position: Sitting, Cuff Size: Large)   Pulse (!) 114   Temp 98.2 F (36.8 C) (Oral)   Resp 16   Ht 5\' 9"  (1.753 m)   Wt 193 lb (87.5 kg)   SpO2 99%   BMI 28.50 kg/m   BP Readings from  Last 3 Encounters:  02/14/21 118/80  02/13/21 128/86  02/10/21 (!) 141/91    Wt Readings from Last 3 Encounters:  02/14/21 193 lb (87.5 kg)  02/13/21 193 lb (87.5 kg)  02/10/21 192 lb (87.1 kg)    Physical Exam Vitals reviewed.  Constitutional:      General: He is not in acute distress.    Appearance: He is ill-appearing. He is not toxic-appearing or diaphoretic.  HENT:     Nose: Nose normal.     Mouth/Throat:     Mouth: Mucous membranes are moist.  Eyes:     General: No scleral icterus.    Conjunctiva/sclera: Conjunctivae normal.  Cardiovascular:     Rate and Rhythm: Tachycardia present.     Pulses: Normal pulses.     Heart sounds: No murmur heard. Pulmonary:     Effort: Pulmonary effort is normal.     Breath sounds: No stridor. No wheezing, rhonchi or rales.  Abdominal:     General: Abdomen is flat. There is no distension.     Palpations: Abdomen is soft. There is no hepatomegaly, splenomegaly, mass or pulsatile mass.     Tenderness: There is abdominal tenderness in the right lower quadrant, periumbilical area, suprapubic area and left lower quadrant. There is no guarding or rebound.     Hernia: There is no hernia in the left inguinal area or right inguinal area.  Genitourinary:    Pubic Area: No rash.      Penis: Normal and circumcised. No erythema, tenderness, discharge, swelling or lesions.      Testes: Normal.        Right: Mass, tenderness or swelling not present.        Left: Mass, tenderness or swelling not present.     Epididymis:     Right: Normal. Not inflamed or enlarged. No mass.     Left: Normal. Not inflamed or enlarged. No mass.     Prostate: Enlarged, tender and nodules present.     Rectum: Guaiac result positive. Mass present. No tenderness, anal fissure, external hemorrhoid or internal hemorrhoid. Normal anal tone.     Comments: + mass deep in the rectum with some mucus, heme + Musculoskeletal:     Cervical back: Neck supple.  Lymphadenopathy:      Cervical: No cervical adenopathy.     Lower Body: No right inguinal adenopathy. No left inguinal adenopathy.  Skin:    General: Skin is warm and dry.     Coloration: Skin is pale.     Findings: No rash.  Neurological:     Mental Status: He is alert.    Lab Results  Component Value Date   WBC 7.4 02/14/2021   HGB 10.4 (L) 02/14/2021   HCT 31.2 (L) 02/14/2021   PLT 321.0 02/14/2021   GLUCOSE 112 (H) 02/14/2021   CHOL 199 10/05/2020   TRIG 170.0 (H) 10/05/2020   HDL 51.20 10/05/2020   LDLDIRECT 130.0 12/28/2015   LDLCALC 114 (H) 10/05/2020   ALT 16 02/14/2021   AST 16 02/14/2021   NA 139 02/14/2021   K 3.6 02/14/2021  CL 100 02/14/2021   CREATININE 0.85 02/14/2021   BUN 13 02/14/2021   CO2 30 02/14/2021   TSH 0.84 10/05/2020   PSA 13.90 03/14/2020   INR 1.0 12/04/2020   HGBA1C 7.2 (H) 02/13/2021   MICROALBUR 2.9 (H) 02/13/2021    No results found.  Assessment & Plan:   Devon Barron was seen today for abdominal pain and diabetes.  Diagnoses and all orders for this visit:  Type II diabetes mellitus with manifestations ([place])- His blood sugar is adequately well controlled. -     Basic metabolic panel; Future -     Urinalysis, Routine w reflex microscopic; Future -     Hemoglobin A1c; Future -     Microalbumin / creatinine urine ratio; Future -     Microalbumin / creatinine urine ratio -     Hemoglobin A1c -     Urinalysis, Routine w reflex microscopic -     Basic metabolic panel  Essential hypertension- His blood pressure is adequately well controlled. -     Basic metabolic panel; Future -     Urinalysis, Routine w reflex microscopic; Future -     Thyroid Panel With TSH; Future -     Thyroid Panel With TSH -     Urinalysis, Routine w reflex microscopic -     Basic metabolic panel  Thrombocytopenia ([place])- His platelet count is normal now.  B12 and folate are normal. -     Vitamin B12; Future -     CBC with Differential/Platelet; Future -     Folate; Future -      Folate -     CBC with Differential/Platelet -     Vitamin B12  Cold intolerance- I will screen him for hyperthyroidism. -     Thyroid Panel With TSH; Future -     Thyroid Panel With TSH  Tachycardia- See above -     Thyroid Panel With TSH; Future -     Thyroid Panel With TSH  Periumbilical abdominal tenderness without rebound tenderness- He has pyuria and neutrophilia.  I am concerned he has acute bacterial prostatitis so will treat with a course of Bactrim DS.  His other labs are reassuring.  I recommended that he undergo a CT scan of the abdomen with contrast to screen for metastatic disease, rectal mass, abscess, proctitis, and other causes of abdominopelvic pain. -     Amylase; Future -     Lipase; Future -     C-reactive protein; Future -     CT Abdomen Pelvis W Contrast; Future -     C-reactive protein -     Lipase -     Amylase -     HYDROcodone-acetaminophen (NORCO/VICODIN) 5-325 MG tablet; Take 1 tablet by mouth every 4 (four) hours as needed for up to 7 days.  Malignant neoplasm of prostate ([facility]) -     CT Abdomen Pelvis W Contrast; Future -     HYDROcodone-acetaminophen (NORCO/VICODIN) 5-325 MG tablet; Take 1 tablet by mouth every 4 (four) hours as needed for up to 7 days.  Cancer-related pain -     HYDROcodone-acetaminophen (NORCO/VICODIN) 5-325 MG tablet; Take 1 tablet by mouth every 4 (four) hours as needed for up to 7 days.  Acute prostatitis -     sulfamethoxazole-trimethoprim (BACTRIM DS) 800-160 MG tablet; Take 1 tablet by mouth 2 (two) times daily.  Deficiency anemia- Will screen for vitamin deficiencies. -     Iron; Future -  Vitamin B1; Future -     Ferritin; Future  I have discontinued Devon Barron's diazepam. I have also changed his HYDROcodone-acetaminophen. Additionally, I am having him start on sulfamethoxazole-trimethoprim. Lastly, I am having him maintain his cholecalciferol, Rebif, rosuvastatin, Synjardy, multivitamin with minerals, tamsulosin,  indapamide, meloxicam, and clobetasol cream.  Meds ordered this encounter  Medications   HYDROcodone-acetaminophen (NORCO/VICODIN) 5-325 MG tablet    Sig: Take 1 tablet by mouth every 4 (four) hours as needed for up to 7 days.    Dispense:  35 tablet    Refill:  0   sulfamethoxazole-trimethoprim (BACTRIM DS) 800-160 MG tablet    Sig: Take 1 tablet by mouth 2 (two) times daily.    Dispense:  60 tablet    Refill:  0   I spent 50 minutes in preparing to see the patient by review of recent labs, obtaining and reviewing separately obtained history, communicating with the patient, ordering medications, labs, and a CT scan, and documenting clinical information in the EHR including the differential Dx, treatment, and any further evaluation and other management of 1. Type II diabetes mellitus with manifestations ([place]) 2. Essential hypertension 3. Thrombocytopenia ([place]) 4. Cold intolerance 5. Tachycardia 6. Periumbilical abdominal tenderness without rebound tenderness 7. Malignant neoplasm of prostate ([place]) 8. Cancer-related pain 9. Acute prostatitis 10. Deficiency anemia       Follow-up: Return in about 1 week (around 02/20/2021).  Scarlette Calico, MD

## 2021-02-13 NOTE — Telephone Encounter (Signed)
Pt coming in today at 3.40pm

## 2021-02-14 ENCOUNTER — Encounter: Payer: Self-pay | Admitting: Neurology

## 2021-02-14 ENCOUNTER — Ambulatory Visit (INDEPENDENT_AMBULATORY_CARE_PROVIDER_SITE_OTHER): Payer: BC Managed Care – PPO | Admitting: Neurology

## 2021-02-14 ENCOUNTER — Other Ambulatory Visit (INDEPENDENT_AMBULATORY_CARE_PROVIDER_SITE_OTHER): Payer: BC Managed Care – PPO

## 2021-02-14 VITALS — BP 118/80 | HR 109 | Resp 18 | Ht 69.0 in | Wt 193.0 lb

## 2021-02-14 DIAGNOSIS — G35 Multiple sclerosis: Secondary | ICD-10-CM | POA: Diagnosis not present

## 2021-02-14 LAB — VITAMIN D 25 HYDROXY (VIT D DEFICIENCY, FRACTURES): VITD: 41.98 ng/mL (ref 30.00–100.00)

## 2021-02-14 LAB — COMPREHENSIVE METABOLIC PANEL
ALT: 16 U/L (ref 0–53)
AST: 16 U/L (ref 0–37)
Albumin: 3.8 g/dL (ref 3.5–5.2)
Alkaline Phosphatase: 48 U/L (ref 39–117)
BUN: 13 mg/dL (ref 6–23)
CO2: 30 mEq/L (ref 19–32)
Calcium: 10 mg/dL (ref 8.4–10.5)
Chloride: 100 mEq/L (ref 96–112)
Creatinine, Ser: 0.85 mg/dL (ref 0.40–1.50)
GFR: 94.82 mL/min (ref >60.00)
Glucose, Bld: 112 mg/dL — ABNORMAL HIGH (ref 70–99)
Potassium: 3.6 mEq/L (ref 3.5–5.1)
Sodium: 139 mEq/L (ref 135–145)
Total Bilirubin: 0.3 mg/dL (ref 0.2–1.2)
Total Protein: 8.3 g/dL (ref 6.0–8.3)

## 2021-02-14 LAB — CBC WITH DIFFERENTIAL/PLATELET
Basophils Absolute: 0 10*3/uL (ref 0.0–0.1)
Basophils Absolute: 0.1 10*3/uL (ref 0.0–0.1)
Basophils Relative: 0.5 % (ref 0.0–3.0)
Basophils Relative: 0.7 % (ref 0.0–3.0)
Eosinophils Absolute: 0.1 10*3/uL (ref 0.0–0.7)
Eosinophils Absolute: 0.1 10*3/uL (ref 0.0–0.7)
Eosinophils Relative: 1.7 % (ref 0.0–5.0)
Eosinophils Relative: 1.8 % (ref 0.0–5.0)
HCT: 31.9 % — ABNORMAL LOW (ref 39.0–52.0)
HCT: 31.2 % — ABNORMAL LOW (ref 39.0–52.0)
Hemoglobin: 10.9 g/dL — ABNORMAL LOW (ref 13.0–17.0)
Hemoglobin: 10.4 g/dL — ABNORMAL LOW (ref 13.0–17.0)
Lymphocytes Relative: 4.6 % — ABNORMAL LOW (ref 12.0–46.0)
Lymphocytes Relative: 3.7 % — ABNORMAL LOW (ref 12.0–46.0)
Lymphs Abs: 0.4 10*3/uL — ABNORMAL LOW (ref 0.7–4.0)
Lymphs Abs: 0.3 10*3/uL — ABNORMAL LOW (ref 0.7–4.0)
MCHC: 34.1 g/dL (ref 30.0–36.0)
MCHC: 33.3 g/dL (ref 30.0–36.0)
MCV: 84.7 fl (ref 78.0–100.0)
MCV: 84.6 fl (ref 78.0–100.0)
Monocytes Absolute: 1 10*3/uL (ref 0.1–1.0)
Monocytes Absolute: 0.7 10*3/uL (ref 0.1–1.0)
Monocytes Relative: 11.7 % (ref 3.0–12.0)
Monocytes Relative: 9.8 % (ref 3.0–12.0)
Neutro Abs: 7.2 10*3/uL (ref 1.4–7.7)
Neutro Abs: 6.3 10*3/uL (ref 1.4–7.7)
Neutrophils Relative %: 81.5 % — ABNORMAL HIGH (ref 43.0–77.0)
Neutrophils Relative %: 84 % — ABNORMAL HIGH (ref 43.0–77.0)
Platelets: 327 10*3/uL (ref 150.0–400.0)
Platelets: 321 10*3/uL (ref 150.0–400.0)
RBC: 3.77 Mil/uL — ABNORMAL LOW (ref 4.22–5.81)
RBC: 3.69 Mil/uL — ABNORMAL LOW (ref 4.22–5.81)
RDW: 14.5 % (ref 11.5–15.5)
RDW: 14.3 % (ref 11.5–15.5)
WBC: 8.8 10*3/uL (ref 4.0–10.5)
WBC: 7.4 10*3/uL (ref 4.0–10.5)

## 2021-02-14 LAB — BASIC METABOLIC PANEL
BUN: 14 mg/dL (ref 6–23)
CO2: 30 mEq/L (ref 19–32)
Calcium: 10.1 mg/dL (ref 8.4–10.5)
Chloride: 99 mEq/L (ref 96–112)
Creatinine, Ser: 0.94 mg/dL (ref 0.40–1.50)
GFR: 88.46 mL/min (ref >60.00)
Glucose, Bld: 93 mg/dL (ref 70–99)
Potassium: 3.8 mEq/L (ref 3.5–5.1)
Sodium: 138 mEq/L (ref 135–145)

## 2021-02-14 LAB — MICROALBUMIN / CREATININE URINE RATIO
Creatinine,U: 277.3 mg/dL
Microalb Creat Ratio: 1 mg/g (ref 0.0–30.0)
Microalb, Ur: 2.9 mg/dL — ABNORMAL HIGH (ref 0.0–1.9)

## 2021-02-14 LAB — AMYLASE: Amylase: 32 U/L (ref 27–131)

## 2021-02-14 LAB — FOLATE: Folate: >24.4 ng/mL (ref >5.9)

## 2021-02-14 LAB — LIPASE: Lipase: 18 U/L (ref 11.0–59.0)

## 2021-02-14 LAB — VITAMIN B12: Vitamin B-12: 487 pg/mL (ref 211–911)

## 2021-02-14 LAB — HEMOGLOBIN A1C: Hgb A1c MFr Bld: 7.2 % — ABNORMAL HIGH (ref 4.6–6.5)

## 2021-02-14 LAB — C-REACTIVE PROTEIN: CRP: 6.4 mg/dL (ref 0.5–20.0)

## 2021-02-14 NOTE — Patient Instructions (Addendum)
Continue Rebif   Continue D3 4000 IU daily Check CBC with diff, CMP and vit D Repeat labs in 6 months Follow up after labs in 6 months.  Your provider has requested that you have labwork completed today. Please go to [facility] ([id number]) on the second floor of this building before leaving the office today. You do not need to check in. If you are not called within 15 minutes please check with the front desk.

## 2021-02-15 ENCOUNTER — Ambulatory Visit (INDEPENDENT_AMBULATORY_CARE_PROVIDER_SITE_OTHER): Payer: BC Managed Care – PPO | Admitting: Internal Medicine

## 2021-02-15 ENCOUNTER — Ambulatory Visit: Payer: BC Managed Care – PPO | Admitting: Internal Medicine

## 2021-02-15 ENCOUNTER — Encounter: Payer: Self-pay | Admitting: Internal Medicine

## 2021-02-15 ENCOUNTER — Other Ambulatory Visit: Payer: Self-pay

## 2021-02-15 DIAGNOSIS — D539 Nutritional anemia, unspecified: Secondary | ICD-10-CM

## 2021-02-15 DIAGNOSIS — G893 Neoplasm related pain (acute) (chronic): Secondary | ICD-10-CM | POA: Insufficient documentation

## 2021-02-15 DIAGNOSIS — N410 Acute prostatitis: Secondary | ICD-10-CM | POA: Insufficient documentation

## 2021-02-15 LAB — URINALYSIS, ROUTINE W REFLEX MICROSCOPIC
Bilirubin Urine: NEGATIVE
Hgb urine dipstick: NEGATIVE
Ketones, ur: NEGATIVE
Leukocytes,Ua: NEGATIVE
Nitrite: NEGATIVE
RBC / HPF: NONE SEEN (ref 0–?)
Specific Gravity, Urine: 1.02 (ref 1.000–1.030)
Urine Glucose: NEGATIVE
Urobilinogen, UA: 2 — AB (ref 0.0–1.0)
pH: 6 (ref 5.0–8.0)

## 2021-02-15 MED ORDER — SULFAMETHOXAZOLE-TRIMETHOPRIM 800-160 MG PO TABS: 1.0000 | ORAL_TABLET | Freq: Two times a day (BID) | ORAL | 0 refills | Status: AC

## 2021-02-15 NOTE — Progress Notes (Signed)
Subjective:  Patient ID: Devon Barron, male    DOB: 10-Apr-1961  Age: 60 y.o. MRN: [id number]  CC: No chief complaint on file.   HPI Devon Barron presents for Did not show for appt  Outpatient Medications Prior to Visit  Medication Sig Dispense Refill   cholecalciferol (VITAMIN D) 1000 UNITS tablet Take 1,000 Units by mouth daily. VIT D 3 2000 UNITS TAKES 2 TABS = 4000 UNITS     clobetasol cream (TEMOVATE) 4.09 % Apply 1 application topically daily as needed (itching).     Empagliflozin-metFORMIN HCl (SYNJARDY) 5-500 MG TABS Take 1 tablet by mouth 2 (two) times daily. 180 tablet 0   HYDROcodone-acetaminophen (NORCO/VICODIN) 5-325 MG tablet Take 1 tablet by mouth every 4 (four) hours as needed for up to 7 days. 35 tablet 0   indapamide (LOZOL) 1.25 MG tablet TAKE 1 TABLET(1.25 MG) BY MOUTH DAILY (Patient taking differently: Take 1.25 mg by mouth daily.) 90 tablet 1   interferon beta-1a (REBIF) 44 MCG/0.5ML SOSY injection INJECT ONE SYRINGE SUBCUTANEOUSLY THREE TIMES PER WEEK. KEEP REFRIGERATED. (Patient taking differently: Inject 44 mcg into the skin 3 (three) times a week. INJECT ONE SYRINGE SUBCUTANEOUSLY THREE TIMES PER WEEK. KEEP REFRIGERATED. Sunday Tuesday THURSDAY) 0.5 mL 2   meloxicam (MOBIC) 15 MG tablet Take 15 mg by mouth daily.     Multiple Vitamins-Minerals (MULTIVITAMIN WITH MINERALS) tablet Take 1 tablet by mouth daily.     rosuvastatin (CRESTOR) 10 MG tablet Take 1 tablet (10 mg total) by mouth daily. 90 tablet 1   sulfamethoxazole-trimethoprim (BACTRIM DS) 800-160 MG tablet Take 1 tablet by mouth 2 (two) times daily. 60 tablet 0   tamsulosin (FLOMAX) 0.4 MG CAPS capsule Take 1 capsule (0.4 mg total) by mouth daily. 7 capsule 0   No facility-administered medications prior to visit.    ROS Review of Systems  Objective:  Ht 5\' 9"  (1.753 m)   BMI 28.50 kg/m   BP Readings from Last 3 Encounters:  02/14/21 118/80  02/13/21 128/86  02/10/21 (!) 141/91    Wt  Readings from Last 3 Encounters:  02/14/21 193 lb (87.5 kg)  02/13/21 193 lb (87.5 kg)  02/10/21 192 lb (87.1 kg)    Physical Exam  Lab Results  Component Value Date   WBC 7.4 02/14/2021   HGB 10.4 (L) 02/14/2021   HCT 31.2 (L) 02/14/2021   PLT 321.0 02/14/2021   GLUCOSE 112 (H) 02/14/2021   CHOL 199 10/05/2020   TRIG 170.0 (H) 10/05/2020   HDL 51.20 10/05/2020   LDLDIRECT 130.0 12/28/2015   LDLCALC 114 (H) 10/05/2020   ALT 16 02/14/2021   AST 16 02/14/2021   NA 139 02/14/2021   K 3.6 02/14/2021   CL 100 02/14/2021   CREATININE 0.85 02/14/2021   BUN 13 02/14/2021   CO2 30 02/14/2021   TSH 0.84 10/05/2020   PSA 13.90 03/14/2020   INR 1.0 12/04/2020   HGBA1C 7.2 (H) 02/13/2021   MICROALBUR 2.9 (H) 02/13/2021    No results found.  Assessment & Plan:   Diagnoses and all orders for this visit:  Deficiency anemia -     Ferritin -     Vitamin B1 -     Iron   I am having Devon Barron maintain his cholecalciferol, Rebif, rosuvastatin, Synjardy, multivitamin with minerals, tamsulosin, indapamide, meloxicam, clobetasol cream, HYDROcodone-acetaminophen, and sulfamethoxazole-trimethoprim.  No orders of the defined types were placed in this encounter.    Follow-up: No follow-ups on file.  Scarlette Calico, MD

## 2021-02-16 ENCOUNTER — Telehealth: Payer: Self-pay

## 2021-02-16 ENCOUNTER — Other Ambulatory Visit: Payer: Self-pay | Admitting: Internal Medicine

## 2021-02-16 DIAGNOSIS — K921 Melena: Secondary | ICD-10-CM | POA: Insufficient documentation

## 2021-02-16 DIAGNOSIS — D500 Iron deficiency anemia secondary to blood loss (chronic): Secondary | ICD-10-CM | POA: Insufficient documentation

## 2021-02-16 LAB — FERRITIN: Ferritin: 841.4 ng/mL — ABNORMAL HIGH (ref 22.0–322.0)

## 2021-02-16 LAB — IRON: Iron: 27 ug/dL — ABNORMAL LOW (ref 42–165)

## 2021-02-16 LAB — THYROID PANEL WITH TSH
Free Thyroxine Index: 2.8 (ref 1.4–3.8)
T3 Uptake: 32 % (ref 22–35)
T4, Total: 8.8 ug/dL (ref 4.9–10.5)
TSH: 0.95 mIU/L (ref 0.40–4.50)

## 2021-02-16 MED ORDER — ACCRUFER 30 MG PO CAPS: 1.0000 | ORAL_CAPSULE | Freq: Two times a day (BID) | ORAL | 1 refills | Status: DC

## 2021-02-16 NOTE — Telephone Encounter (Signed)
Key: [id number]

## 2021-02-16 NOTE — Telephone Encounter (Signed)
-----   Message from Marguarite Arbour sent at 02/16/2021  1:59 PM EDT ----- PA request

## 2021-02-18 ENCOUNTER — Encounter: Payer: Self-pay | Admitting: Internal Medicine

## 2021-02-19 ENCOUNTER — Telehealth: Payer: Self-pay | Admitting: Physician Assistant

## 2021-02-19 ENCOUNTER — Telehealth: Payer: Self-pay

## 2021-02-19 MED ORDER — REBIF 44 MCG/0.5ML ~~LOC~~ SOSY: PREFILLED_SYRINGE | SUBCUTANEOUS | 5 refills | Status: DC

## 2021-02-19 NOTE — Telephone Encounter (Signed)
Per CoverMyMeds: ? ?PA was denied.  ?

## 2021-02-19 NOTE — Telephone Encounter (Signed)
Pt has stated there was a medication to be sent in for mail order at his visit on 02/16/21. However, the address we had on file was not correct. Pt is asking that we please reorder medication to be sent to updated address on file.  Medication: Ferric Maltol (ACCRUFER) 30 MG CAPS

## 2021-02-19 NOTE — Telephone Encounter (Signed)
PCP has referred the pt for iron infusions.

## 2021-02-19 NOTE — Telephone Encounter (Signed)
Received a new hem referral from Dr. Ronnald Ramp for IDA. Pt has been cld and scheduled to see Murray Hodgkins on 7/26 at 11am. Pt aware to arrive 20 minutes early.

## 2021-02-19 NOTE — Telephone Encounter (Signed)
Per Mychart message from pt today, His refill for Rebif never went through to the pharmacy. Please resend it.

## 2021-02-20 ENCOUNTER — Other Ambulatory Visit: Payer: Self-pay

## 2021-02-20 ENCOUNTER — Ambulatory Visit (INDEPENDENT_AMBULATORY_CARE_PROVIDER_SITE_OTHER)
Admission: RE | Admit: 2021-02-20 | Discharge: 2021-02-20 | Disposition: A | Discharge status: Home / Self Care | Payer: BC Managed Care – PPO | Source: Ambulatory Visit | Attending: Internal Medicine | Admitting: Internal Medicine

## 2021-02-20 ENCOUNTER — Other Ambulatory Visit: Payer: Self-pay | Admitting: Internal Medicine

## 2021-02-20 DIAGNOSIS — R10815 Periumbilic abdominal tenderness: Secondary | ICD-10-CM

## 2021-02-20 DIAGNOSIS — C61 Malignant neoplasm of prostate: Secondary | ICD-10-CM | POA: Diagnosis not present

## 2021-02-20 DIAGNOSIS — K631 Perforation of intestine (nontraumatic): Secondary | ICD-10-CM | POA: Insufficient documentation

## 2021-02-20 MED ORDER — IOHEXOL 300 MG/ML  SOLN
100.0000 mL | Freq: Once | INTRAMUSCULAR | Status: AC | PRN
  Administered 2021-02-20: 100 mL via INTRAVENOUS

## 2021-02-21 ENCOUNTER — Emergency Department (HOSPITAL_COMMUNITY)
Admission: EM | Admit: 2021-02-21 | Discharge: 2021-02-21 | Disposition: A | Discharge status: Home / Self Care | Payer: BC Managed Care – PPO | Attending: Emergency Medicine | Admitting: Emergency Medicine

## 2021-02-21 ENCOUNTER — Encounter (HOSPITAL_COMMUNITY): Payer: Self-pay | Admitting: Emergency Medicine

## 2021-02-21 DIAGNOSIS — Z79899 Other long term (current) drug therapy: Secondary | ICD-10-CM | POA: Insufficient documentation

## 2021-02-21 DIAGNOSIS — E1169 Type 2 diabetes mellitus with other specified complication: Secondary | ICD-10-CM | POA: Insufficient documentation

## 2021-02-21 DIAGNOSIS — Z7984 Long term (current) use of oral hypoglycemic drugs: Secondary | ICD-10-CM | POA: Insufficient documentation

## 2021-02-21 DIAGNOSIS — R000 Tachycardia, unspecified: Secondary | ICD-10-CM | POA: Insufficient documentation

## 2021-02-21 DIAGNOSIS — K5900 Constipation, unspecified: Secondary | ICD-10-CM | POA: Diagnosis not present

## 2021-02-21 DIAGNOSIS — Z8546 Personal history of malignant neoplasm of prostate: Secondary | ICD-10-CM | POA: Diagnosis not present

## 2021-02-21 DIAGNOSIS — Z20822 Contact with and (suspected) exposure to covid-19: Secondary | ICD-10-CM | POA: Diagnosis not present

## 2021-02-21 DIAGNOSIS — S3669XA Other injury of rectum, initial encounter: Secondary | ICD-10-CM

## 2021-02-21 DIAGNOSIS — K6281 Anal sphincter tear (healed) (nontraumatic) (old): Secondary | ICD-10-CM | POA: Insufficient documentation

## 2021-02-21 DIAGNOSIS — K6289 Other specified diseases of anus and rectum: Secondary | ICD-10-CM | POA: Diagnosis present

## 2021-02-21 DIAGNOSIS — E785 Hyperlipidemia, unspecified: Secondary | ICD-10-CM | POA: Insufficient documentation

## 2021-02-21 LAB — CBC
HCT: 31.8 % — ABNORMAL LOW (ref 39.0–52.0)
Hemoglobin: 10.1 g/dL — ABNORMAL LOW (ref 13.0–17.0)
MCH: 27.5 pg (ref 26.0–34.0)
MCHC: 31.8 g/dL (ref 30.0–36.0)
MCV: 86.6 fL (ref 80.0–100.0)
Platelets: 343 10*3/uL (ref 150–400)
RBC: 3.67 MIL/uL — ABNORMAL LOW (ref 4.22–5.81)
RDW: 14.1 % (ref 11.5–15.5)
WBC: 7.5 10*3/uL (ref 4.0–10.5)
nRBC: 0 % (ref 0.0–0.2)

## 2021-02-21 LAB — URINALYSIS, ROUTINE W REFLEX MICROSCOPIC
Bilirubin Urine: NEGATIVE
Glucose, UA: NEGATIVE mg/dL
Hgb urine dipstick: NEGATIVE
Ketones, ur: NEGATIVE mg/dL
Leukocytes,Ua: NEGATIVE
Nitrite: NEGATIVE
Protein, ur: 30 mg/dL — AB
Specific Gravity, Urine: 1.027 (ref 1.005–1.030)
pH: 5 (ref 5.0–8.0)

## 2021-02-21 LAB — COMPREHENSIVE METABOLIC PANEL
ALT: 19 U/L (ref 0–44)
AST: 18 U/L (ref 15–41)
Albumin: 3.3 g/dL — ABNORMAL LOW (ref 3.5–5.0)
Alkaline Phosphatase: 50 U/L (ref 38–126)
Anion gap: 6 (ref 5–15)
BUN: 8 mg/dL (ref 6–20)
CO2: 26 mmol/L (ref 22–32)
Calcium: 9.7 mg/dL (ref 8.9–10.3)
Chloride: 105 mmol/L (ref 98–111)
Creatinine, Ser: 0.91 mg/dL (ref 0.61–1.24)
GFR, Estimated: >60 mL/min (ref >60)
Glucose, Bld: 140 mg/dL — ABNORMAL HIGH (ref 70–99)
Potassium: 3.8 mmol/L (ref 3.5–5.1)
Sodium: 137 mmol/L (ref 135–145)
Total Bilirubin: 0.4 mg/dL (ref 0.3–1.2)
Total Protein: 8 g/dL (ref 6.5–8.1)

## 2021-02-21 LAB — RESP PANEL BY RT-PCR (FLU A&B, COVID) ARPGX2
Influenza A by PCR: NEGATIVE
Influenza B by PCR: NEGATIVE
SARS Coronavirus 2 by RT PCR: NEGATIVE

## 2021-02-21 MED ORDER — SODIUM CHLORIDE 0.9 % IV BOLUS
1000.0000 mL | Freq: Once | INTRAVENOUS | Status: AC
  Administered 2021-02-21: 1000 mL via INTRAVENOUS

## 2021-02-21 MED ORDER — HYDROCODONE-ACETAMINOPHEN 5-325 MG PO TABS: 1.0000 | ORAL_TABLET | ORAL | 0 refills | Status: DC | PRN

## 2021-02-21 MED ORDER — AMOXICILLIN-POT CLAVULANATE 875-125 MG PO TABS: 1.0000 | ORAL_TABLET | Freq: Two times a day (BID) | ORAL | 0 refills | Status: AC

## 2021-02-21 NOTE — ED Provider Notes (Signed)
[facility] EMERGENCY DEPARTMENT Provider Note   CSN: [id number] Arrival date & time: 02/21/21  5329     History Chief Complaint  Patient presents with   Wound Check    Devon Barron is a 60 y.o. male.  HPI 60 year old male presents with concern for possible rectal perforation.  The patient's has been having rectal pain for 2 months since he got seeds put in his prostate.  He has also received radiation therapy.  The patient tells me that the pain has never dramatically all of a sudden got worse but has slowly progressed.  No fevers.  He has been having constipation and sometimes he will notice little bits of blood when he goes to the bathroom.  CT was obtained by PCP as an outpatient to help assess what was going on and there was a concern for a possible tear.  Patient put in a Fleet enema this morning due to constipation and noticed severe pain when doing it.  Past Medical History:  Diagnosis Date   DM type 2 (diabetes mellitus, type 2) ([place])    Hyperlipidemia    MS (multiple sclerosis) ([place])    Prostate cancer [facility])     Patient Active Problem List   Diagnosis Date Noted   Rectal perforation ([place]) 02/20/2021   Blood in stool 02/16/2021   Iron deficiency anemia due to chronic blood loss 02/16/2021   Cancer-related pain 02/15/2021   Acute prostatitis 02/15/2021   Deficiency anemia 02/15/2021   Thrombocytopenia ([place]) 02/13/2021   Tachycardia 02/13/2021   Cold intolerance 92/42/6834   Periumbilical abdominal tenderness without rebound tenderness 02/13/2021   Abnormal electrocardiogram (ECG) (EKG) 10/05/2020   Type II diabetes mellitus with manifestations ([place]) 10/05/2020   Malignant neoplasm of prostate ([place]) 10/03/2020   Essential hypertension 03/31/2018   Hyperlipidemia LDL goal <100 03/31/2018   Hypogonadism in male 03/24/2018   Erectile dysfunction 03/21/2014   Herpes genitalis in men 03/21/2014   STD (sexually transmitted disease) 03/21/2014    GERD (gastroesophageal reflux disease) 09/17/2013   Cervical radiculitis 09/17/2013   Routine general medical examination at a health care facility 09/03/2013   Nonspecific abnormal electrocardiogram (ECG) (EKG) 09/03/2013   Relapsing remitting multiple sclerosis ([place]) 07/28/2013    Past Surgical History:  Procedure Laterality Date   CYSTOSCOPY  12/07/2020   Procedure: CYSTOSCOPY FLEXIBLE;  Surgeon: Ardis Hughs, MD;  Location: [facility];  Service: Urology;;   DEVIATED SETUM SURGERY  25 YRS AGO   HERNIA REPAIR  03/2013   INGUINAL   PROSTATE BIOPSY     RADIOACTIVE SEED IMPLANT N/A 12/07/2020   Procedure: RADIOACTIVE SEED IMPLANT/BRACHYTHERAPY IMPLANT;  Surgeon: Ardis Hughs, MD;  Location: [facility];  Service: Urology;  Laterality: N/A;   SPACE OAR INSTILLATION N/A 12/07/2020   Procedure: SPACE OAR INSTILLATION;  Surgeon: Ardis Hughs, MD;  Location: [facility];  Service: Urology;  Laterality: N/A;       Family History  Problem Relation Age of Onset   Hypertension Mother    Ovarian cancer Mother    Hypertension Father    Prostate cancer Father    Diabetes Sister    Diabetes Brother    Ataxia Neg Hx    Chorea Neg Hx    Dementia Neg Hx    Mental retardation Neg Hx    Migraines Neg Hx    Multiple sclerosis Neg Hx    Neurofibromatosis Neg Hx    Neuropathy Neg  Hx    Parkinsonism Neg Hx    Seizures Neg Hx    Stroke Neg Hx    Early death Neg Hx    Cancer Neg Hx    Alcohol abuse Neg Hx    Hearing loss Neg Hx    Heart disease Neg Hx    Hyperlipidemia Neg Hx    Kidney disease Neg Hx    Breast cancer Neg Hx    Colon cancer Neg Hx    Pancreatic cancer Neg Hx     Social History   Tobacco Use   Smoking status: Never   Smokeless tobacco: Never  Vaping Use   Vaping Use: Never used  Substance Use Topics   Alcohol use: Yes    Alcohol/week: 5.0 standard drinks    Types: 5 Cans of beer per week    Comment:  Occ   Drug use: No    Home Medications Prior to Admission medications   Medication Sig Start Date End Date Taking? Authorizing Provider  amoxicillin-clavulanate (AUGMENTIN) 875-125 MG tablet Take 1 tablet by mouth 2 (two) times daily for 10 days. 02/21/21 03/03/21 Yes Sherwood Gambler, MD  cholecalciferol (VITAMIN D) 1000 UNITS tablet Take 1,000 Units by mouth daily. VIT D 3 2000 UNITS TAKES 2 TABS = 4000 UNITS   Yes [provider]  clobetasol cream (TEMOVATE) 5.17 % Apply 1 application topically daily as needed (itching).   Yes [provider]  Empagliflozin-metFORMIN HCl (SYNJARDY) 5-500 MG TABS Take 1 tablet by mouth 2 (two) times daily. 10/05/20  Yes Janith Lima, MD  HYDROcodone-acetaminophen (NORCO) 5-325 MG tablet Take 1 tablet by mouth every 4 (four) hours as needed. 02/21/21  Yes Sherwood Gambler, MD  indapamide (LOZOL) 1.25 MG tablet TAKE 1 TABLET(1.25 MG) BY MOUTH DAILY Patient taking differently: Take 1.25 mg by mouth daily. 12/29/20  Yes Janith Lima, MD  interferon beta-1a (REBIF) 44 MCG/0.5ML SOSY injection INJECT ONE SYRINGE SUBCUTANEOUSLY THREE TIMES PER WEEK. KEEP REFRIGERATED. Patient taking differently: Inject 44 mcg into the skin 3 (three) times a week. Sunday, Tuesday, Thursday. KEEP REFRIGERATED. 02/19/21  Yes Tomi Likens, Adam R, DO  meloxicam (MOBIC) 15 MG tablet Take 15 mg by mouth daily.   Yes [provider]  Multiple Vitamins-Minerals (MULTIVITAMIN WITH MINERALS) tablet Take 1 tablet by mouth daily.   Yes [provider]  rosuvastatin (CRESTOR) 10 MG tablet Take 1 tablet (10 mg total) by mouth daily. 10/05/20  Yes Janith Lima, MD  sulfamethoxazole-trimethoprim (BACTRIM DS) 800-160 MG tablet Take 1 tablet by mouth 2 (two) times daily. 02/15/21 03/18/21 Yes Janith Lima, MD  tamsulosin (FLOMAX) 0.4 MG CAPS capsule Take 1 capsule (0.4 mg total) by mouth daily. Patient taking differently: Take 0.4 mg by mouth at bedtime. 12/07/20  Yes  Ardis Hughs, MD  Ferric Maltol (ACCRUFER) 30 MG CAPS Take 1 capsule by mouth in the morning and at bedtime. Patient not taking: No sig reported 02/16/21   Janith Lima, MD    Allergies    Patient has no known allergies.  Review of Systems   Review of Systems  Constitutional:  Negative for fever.  Gastrointestinal:  Positive for constipation and rectal pain. Negative for abdominal pain.  Genitourinary:  Negative for dysuria.   Physical Exam Updated Vital Signs BP 133/86   Pulse 88   Temp 98.2 F (36.8 C) (Oral)   Resp 16   SpO2 100%   Physical Exam Vitals and nursing note reviewed.  Constitutional:  Appearance: He is well-developed. He is not ill-appearing or diaphoretic.  HENT:     Head: Normocephalic and atraumatic.     Right Ear: External ear normal.     Left Ear: External ear normal.     Nose: Nose normal.  Eyes:     General:        Right eye: No discharge.        Left eye: No discharge.  Cardiovascular:     Rate and Rhythm: Regular rhythm. Tachycardia present.     Heart sounds: Normal heart sounds.  Pulmonary:     Effort: Pulmonary effort is normal.     Breath sounds: Normal breath sounds.  Abdominal:     General: There is no distension.     Palpations: Abdomen is soft.     Tenderness: There is no abdominal tenderness.  Musculoskeletal:     Cervical back: Neck supple.  Skin:    General: Skin is warm and dry.  Neurological:     Mental Status: He is alert.  Psychiatric:        Mood and Affect: Mood is not anxious.    ED Results / Procedures / Treatments   Labs (all labs ordered are listed, but only abnormal results are displayed) Labs Reviewed  COMPREHENSIVE METABOLIC PANEL - Abnormal; Notable for the following components:      Result Value   Glucose, Bld 140 (*)    Albumin 3.3 (*)    All other components within normal limits  CBC - Abnormal; Notable for the following components:   RBC 3.67 (*)    Hemoglobin 10.1 (*)    HCT 31.8 (*)     All other components within normal limits  URINALYSIS, ROUTINE W REFLEX MICROSCOPIC - Abnormal; Notable for the following components:   APPearance HAZY (*)    Protein, ur 30 (*)    Bacteria, UA FEW (*)    All other components within normal limits  RESP PANEL BY RT-PCR (FLU A&B, COVID) [id number]    EKG None  Radiology CT Abdomen Pelvis W Contrast  Result Date: 02/20/2021 CLINICAL DATA:  Abdominal abscess/infection suspected. Several months hx s/p Seeds implant in May 1610 of Periumbilical abdominal tenderness without rebound tenderness- He has pyuria and neutrophilia. 18 lb wt loss, constipation. screen for metastatic disease, rectal mass, abscess EXAM: CT ABDOMEN AND PELVIS WITH CONTRAST TECHNIQUE: Multidetector CT imaging of the abdomen and pelvis was performed using the standard protocol following bolus administration of intravenous contrast. CONTRAST:  161mL OMNIPAQUE IOHEXOL 300 MG/ML  SOLN COMPARISON:  06/21/2020 FINDINGS: Lower chest: Unremarkable. Hepatobiliary: Tiny hypodensity in the dome of the left liver is stable. 2nd tiny hypodensity inferior right liver adjacent to the gallbladder fossa also unchanged. There is no evidence for gallstones, gallbladder wall thickening, or pericholecystic fluid. No intrahepatic or extrahepatic biliary dilation. Pancreas: No focal mass lesion. No dilatation of the main duct. No intraparenchymal cyst. No peripancreatic edema. Spleen: No splenomegaly. No focal mass lesion. Adrenals/Urinary Tract: No adrenal nodule or mass. 14 mm exophytic lesion interpolar right kidney was 13 mm previously and is likely a cyst. Left kidney unremarkable. No evidence for hydroureter. The urinary bladder appears normal for the degree of distention. Stomach/Bowel: Stomach is unremarkable. No gastric wall thickening. No evidence of outlet obstruction. Duodenum is normally positioned as is the ligament of Treitz. No small bowel wall thickening. No small bowel dilatation. The  terminal ileum is normal. The appendix is normal. Ascending transverse and descending colon unremarkable. Circumferential wall thickening  noted in the low rectum with gas and contrast material that appears to be anterior to the anterior rectal wall in the extraperitoneal space between the rectum and the prostate gland (see coronal image 60/6 and axial 76/2). Perirectal edema noted. Vascular/Lymphatic: No abdominal aortic aneurysm. No abdominal lymphadenopathy. No pelvic sidewall lymphadenopathy. Reproductive: Prostate gland largely obscured by beam hardening artifact from brachytherapy seeds. Other: No intraperitoneal free fluid. Musculoskeletal: No worrisome lytic or sclerotic osseous abnormality. IMPRESSION: 1. Circumferential wall thickening in the rectum with perirectal edema and apparent contrast material and gas anterior to the anterior wall the rectum in the extraperitoneal space between the rectum in the prostate gland. This region is difficult to evaluate by CT, made even more difficult by the presence of artifact from the brachytherapy seeds in the prostate gland. Nevertheless, close correlation for rectal leak recommended. Electronically Signed   By: Misty Stanley M.D.   On: 02/20/2021 11:40    Procedures Procedures   Medications Ordered in ED Medications  sodium chloride 0.9 % bolus 1,000 mL (0 mLs Intravenous Stopped 02/21/21 1055)    ED Course  I have reviewed the triage vital signs and the nursing notes.  Pertinent labs & imaging results that were available during my care of the patient were reviewed by me and considered in my medical decision making (see chart for details).    MDM Rules/Calculators/A&P                           I discussed with general surgery who has seen the patient.  They recommend that the patient can go home and if it is a rectal tear it is extraperitoneal and does not necessarily need an emergent treatment.  Given possible tear they recommend Augmentin x10  days.  Will give short course of pain medication.  Otherwise they want him to follow-up with general surgery as an outpatient.  Given return precautions. Final Clinical Impression(s) / ED Diagnoses Final diagnoses:  Rectal tear    Rx / DC Orders ED Discharge Orders          Ordered    amoxicillin-clavulanate (AUGMENTIN) 875-125 MG tablet  2 times daily        02/21/21 1233    HYDROcodone-acetaminophen (NORCO) 5-325 MG tablet  Every 4 hours PRN        02/21/21 1233             Sherwood Gambler, MD 02/21/21 1244

## 2021-02-21 NOTE — Consult Note (Signed)
Consult Note  Devon Barron August 04, 1961  [id number].    Requesting MD: Dr. Sherwood Gambler Chief Complaint/Reason for Consult: rectal perforation and pain  HPI:  Patient is a 60 year old male who presented to [facility] today at the direction of PCP due to concern for rectal perforation on CT scan. Patient recently diagnosed with prostate cancer and underwent seed placement 12/07/20 and finished his last radiation treatment last week. He reports chronic rectal pain since seed placement and denies significant change in this. He reports constipation and actually tried to give himself a fleets enema this AM with pain but a good BM afterwards. Denies blood in stool. Patient reports he has lost 15-20 lbs since seed placement because the pain he reports decreased appetite. He denies fever, chills, chest pain, SOB, urinary symptoms, nausea, vomiting. Mild abdominal pain at times that he thinks is more related to constipation. PMH otherwise significant for MS on interferon, T2DM, HLD. PSH otherwise significant for right open inguinal hernia repair in 2014 at [facility]. NKDA. Patient denies tobacco or illicit drug use. He reports rare alcohol use but denies heavy alcohol use. He still works and lives with his son.   ROS: Review of Systems  Constitutional:  Positive for weight loss. Negative for chills and fever.  Respiratory:  Negative for shortness of breath and wheezing.   Cardiovascular:  Negative for chest pain and palpitations.  Gastrointestinal:  Positive for constipation. Negative for abdominal pain, blood in stool, melena, nausea and vomiting.       Rectal pain  Genitourinary:  Negative for dysuria, frequency and urgency.  All other systems reviewed and are negative.  Family History  Problem Relation Age of Onset   Hypertension Mother    Ovarian cancer Mother    Hypertension Father    Prostate cancer Father    Diabetes Sister    Diabetes Brother    Ataxia Neg Hx    Chorea Neg Hx     Dementia Neg Hx    Mental retardation Neg Hx    Migraines Neg Hx    Multiple sclerosis Neg Hx    Neurofibromatosis Neg Hx    Neuropathy Neg Hx    Parkinsonism Neg Hx    Seizures Neg Hx    Stroke Neg Hx    Early death Neg Hx    Cancer Neg Hx    Alcohol abuse Neg Hx    Hearing loss Neg Hx    Heart disease Neg Hx    Hyperlipidemia Neg Hx    Kidney disease Neg Hx    Breast cancer Neg Hx    Colon cancer Neg Hx    Pancreatic cancer Neg Hx     Past Medical History:  Diagnosis Date   DM type 2 (diabetes mellitus, type 2) ([place])    Hyperlipidemia    MS (multiple sclerosis) ([place])    Prostate cancer ([place])     Past Surgical History:  Procedure Laterality Date   CYSTOSCOPY  12/07/2020   Procedure: CYSTOSCOPY FLEXIBLE;  Surgeon: Ardis Hughs, MD;  Location: [facility];  Service: Urology;;   DEVIATED SETUM SURGERY  25 YRS AGO   HERNIA REPAIR  03/2013   INGUINAL   PROSTATE BIOPSY     RADIOACTIVE SEED IMPLANT N/A 12/07/2020   Procedure: RADIOACTIVE SEED IMPLANT/BRACHYTHERAPY IMPLANT;  Surgeon: Ardis Hughs, MD;  Location: [facility];  Service: Urology;  Laterality: N/A;   SPACE OAR INSTILLATION N/A 12/07/2020  Procedure: SPACE OAR INSTILLATION;  Surgeon: Ardis Hughs, MD;  Location: [facility];  Service: Urology;  Laterality: N/A;    Social History:  reports that he has never smoked. He has never used smokeless tobacco. He reports current alcohol use of about 5.0 standard drinks of alcohol per week. He reports that he does not use drugs.  Allergies: No Known Allergies  (Not in a hospital admission)   Blood pressure 138/80, pulse 89, temperature 98.2 F (36.8 C), temperature source Oral, resp. rate 13, SpO2 100 %. Physical Exam:  General: pleasant, WD, WN male who is laying in bed in NAD HEENT: head is normocephalic, atraumatic.  Sclera are noninjected.  PERRL.  Ears and nose without any masses or lesions.  Mouth is  pink and moist Heart: regular, rate, and rhythm.  Normal s1,s2. No obvious murmurs, gallops, or rubs noted.  Palpable radial and pedal pulses bilaterally Lungs: CTAB, no wheezes, rhonchi, or rales noted.  Respiratory effort nonlabored Abd: soft, NT, ND, +BS, no masses, hernias, or organomegaly GU: rectal exam deferred at this time MS: all 4 extremities are symmetrical with no cyanosis, clubbing, or edema. Skin: warm and dry with no masses, lesions, or rashes Neuro: Cranial nerves 2-12 grossly intact, sensation is normal throughout Psych: A&Ox3 with an appropriate affect.   Results for orders placed or performed during the hospital encounter of 02/21/21 (from the past 48 hour(s))  Comprehensive metabolic panel     Status: Abnormal   Collection Time: 02/21/21  8:35 AM  Result Value Ref Range   Sodium 137 135 - 145 mmol/L   Potassium 3.8 3.5 - 5.1 mmol/L   Chloride 105 98 - 111 mmol/L   CO2 26 22 - 32 mmol/L   Glucose, Bld 140 (H) 70 - 99 mg/dL    Comment: Glucose reference range applies only to samples taken after fasting for at least 8 hours.   BUN 8 6 - 20 mg/dL   Creatinine, Ser 0.91 0.61 - 1.24 mg/dL   Calcium 9.7 8.9 - 10.3 mg/dL   Total Protein 8.0 6.5 - 8.1 g/dL   Albumin 3.3 (L) 3.5 - 5.0 g/dL   AST 18 15 - 41 U/L   ALT 19 0 - 44 U/L   Alkaline Phosphatase 50 38 - 126 U/L   Total Bilirubin 0.4 0.3 - 1.2 mg/dL   GFR, Estimated >60 >60 mL/min    Comment: (NOTE) Calculated using the CKD-EPI Creatinine Equation (2021)    Anion gap 6 5 - 15    Comment: Performed at [facility] [address], [place], [place] [address]  CBC     Status: Abnormal   Collection Time: 02/21/21  8:35 AM  Result Value Ref Range   WBC 7.5 4.0 - 10.5 K/uL   RBC 3.67 (L) 4.22 - 5.81 MIL/uL   Hemoglobin 10.1 (L) 13.0 - 17.0 g/dL   HCT 31.8 (L) 39.0 - 52.0 %   MCV 86.6 80.0 - 100.0 fL   MCH 27.5 26.0 - 34.0 pg   MCHC 31.8 30.0 - 36.0 g/dL   RDW 14.1 11.5 - 15.5 %   Platelets 343 150 - 400  K/uL   nRBC 0.0 0.0 - 0.2 %    Comment: Performed at [facility], [place] [address], [place], [place] [address]  Urinalysis, Routine w reflex microscopic Urine, Clean Catch     Status: Abnormal   Collection Time: 02/21/21  8:35 AM  Result Value Ref Range  Color, Urine YELLOW YELLOW   APPearance HAZY (A) CLEAR   Specific Gravity, Urine 1.027 1.005 - 1.030   pH 5.0 5.0 - 8.0   Glucose, UA NEGATIVE NEGATIVE mg/dL   Hgb urine dipstick NEGATIVE NEGATIVE   Bilirubin Urine NEGATIVE NEGATIVE   Ketones, ur NEGATIVE NEGATIVE mg/dL   Protein, ur 30 (A) NEGATIVE mg/dL   Nitrite NEGATIVE NEGATIVE   Leukocytes,Ua NEGATIVE NEGATIVE   RBC / HPF 0-5 0 - 5 RBC/hpf   WBC, UA 11-20 0 - 5 WBC/hpf   Bacteria, UA FEW (A) NONE SEEN   Squamous Epithelial / LPF 0-5 0 - 5   Mucus PRESENT    Hyaline Casts, UA PRESENT     Comment: Performed at [facility] [address], [place], [place] [address]  Resp Panel by RT-PCR (Flu A&B, Covid) Nasopharyngeal Swab     Status: None   Collection Time: 02/21/21  9:20 AM   Specimen: Nasopharyngeal Swab; Nasopharyngeal(NP) swabs in vial transport medium  Result Value Ref Range   SARS Coronavirus 2 by RT PCR NEGATIVE NEGATIVE    Comment: (NOTE) SARS-CoV-2 target nucleic acids are NOT DETECTED.  The SARS-CoV-2 RNA is generally detectable in upper respiratory specimens during the acute phase of infection. The lowest concentration of SARS-CoV-2 viral copies this assay can detect is 138 copies/mL. A negative result does not preclude SARS-Cov-2 infection and should not be used as the sole basis for treatment or other patient management decisions. A negative result may occur with  improper specimen collection/handling, submission of specimen other than nasopharyngeal swab, presence of viral mutation(s) within the areas targeted by this assay, and inadequate number of viral copies(<138 copies/mL). A negative result must be combined with clinical observations,  patient history, and epidemiological information. The expected result is Negative.  Fact Sheet for Patients:  EntrepreneurPulse.com.au  Fact Sheet for Healthcare Providers:  IncredibleEmployment.be  This test is no t yet approved or cleared by the [place] FDA and  has been authorized for detection and/or diagnosis of SARS-CoV-2 by FDA under an Emergency Use Authorization (EUA). This EUA will remain  in effect (meaning this test can be used) for the duration of the COVID-19 declaration under Section 564(b)(1) of the Act, 21 U.S.C.section 360bbb-3(b)(1), unless the authorization is terminated  or revoked sooner.       Influenza A by PCR NEGATIVE NEGATIVE   Influenza B by PCR NEGATIVE NEGATIVE    Comment: (NOTE) The Xpert Xpress SARS-CoV-2/FLU/RSV plus assay is intended as an aid in the diagnosis of influenza from Nasopharyngeal swab specimens and should not be used as a sole basis for treatment. Nasal washings and aspirates are unacceptable for Xpert Xpress SARS-CoV-2/FLU/RSV testing.  Fact Sheet for Patients: EntrepreneurPulse.com.au  Fact Sheet for Healthcare Providers: IncredibleEmployment.be  This test is not yet approved or cleared by the [place] FDA and has been authorized for detection and/or diagnosis of SARS-CoV-2 by FDA under an Emergency Use Authorization (EUA). This EUA will remain in effect (meaning this test can be used) for the duration of the COVID-19 declaration under Section 564(b)(1) of the Act, 21 U.S.C. section 360bbb-3(b)(1), unless the authorization is terminated or revoked.  Performed at [facility], [place] [address] [place], [place] [address]    CT Abdomen Pelvis W Contrast  Result Date: 02/20/2021 CLINICAL DATA:  Abdominal abscess/infection suspected. Several months hx s/p Seeds implant in May 1324 of Periumbilical abdominal tenderness without rebound  tenderness- He has pyuria and neutrophilia. 18 lb  wt loss, constipation. screen for metastatic disease, rectal mass, abscess EXAM: CT ABDOMEN AND PELVIS WITH CONTRAST TECHNIQUE: Multidetector CT imaging of the abdomen and pelvis was performed using the standard protocol following bolus administration of intravenous contrast. CONTRAST:  162mL OMNIPAQUE IOHEXOL 300 MG/ML  SOLN COMPARISON:  06/21/2020 FINDINGS: Lower chest: Unremarkable. Hepatobiliary: Tiny hypodensity in the dome of the left liver is stable. 2nd tiny hypodensity inferior right liver adjacent to the gallbladder fossa also unchanged. There is no evidence for gallstones, gallbladder wall thickening, or pericholecystic fluid. No intrahepatic or extrahepatic biliary dilation. Pancreas: No focal mass lesion. No dilatation of the main duct. No intraparenchymal cyst. No peripancreatic edema. Spleen: No splenomegaly. No focal mass lesion. Adrenals/Urinary Tract: No adrenal nodule or mass. 14 mm exophytic lesion interpolar right kidney was 13 mm previously and is likely a cyst. Left kidney unremarkable. No evidence for hydroureter. The urinary bladder appears normal for the degree of distention. Stomach/Bowel: Stomach is unremarkable. No gastric wall thickening. No evidence of outlet obstruction. Duodenum is normally positioned as is the ligament of Treitz. No small bowel wall thickening. No small bowel dilatation. The terminal ileum is normal. The appendix is normal. Ascending transverse and descending colon unremarkable. Circumferential wall thickening noted in the low rectum with gas and contrast material that appears to be anterior to the anterior rectal wall in the extraperitoneal space between the rectum and the prostate gland (see coronal image 60/6 and axial 76/2). Perirectal edema noted. Vascular/Lymphatic: No abdominal aortic aneurysm. No abdominal lymphadenopathy. No pelvic sidewall lymphadenopathy. Reproductive: Prostate gland largely obscured by  beam hardening artifact from brachytherapy seeds. Other: No intraperitoneal free fluid. Musculoskeletal: No worrisome lytic or sclerotic osseous abnormality. IMPRESSION: 1. Circumferential wall thickening in the rectum with perirectal edema and apparent contrast material and gas anterior to the anterior wall the rectum in the extraperitoneal space between the rectum in the prostate gland. This region is difficult to evaluate by CT, made even more difficult by the presence of artifact from the brachytherapy seeds in the prostate gland. Nevertheless, close correlation for rectal leak recommended. Electronically Signed   By: Misty Stanley M.D.   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 Constipation - CT with circumferential wall thickening in rectum and small amount contrast and gas anterior to the anterior wall the rectum in the extraperitoneal space between the rectum in the prostate gland, difficult to visualize well with artifact from seeds - no leukocytosis, afebrile, no large abscess or collection noted - rectal pain is chronic and has not changed since seed placement - at this time I would recommend discharge home on 10 days PO abx for proctocolitis likely secondary to radiation - since he has finished radiation at this point, hopefully pain will continue to improve with time - recommend fiber supplementation and bowel regimen for constipation  - may follow up with colorectal surgery PRN but no acute surgical intervention recommended at this time   Prostate cancer - s/p seeds and recently completed 25/25 radiation treatments last week MS T2DM HLD  Norm Parcel, [facility] Surgery 02/21/2021, 10:51 AM Please see Amion for pager number during day hours 7:00am-4:30pm

## 2021-02-21 NOTE — Discharge Instructions (Addendum)
Be sure to use MiraLAX and a stool softener.  You are to take the antibiotics for 10 days.  If at any point you develop new or worsening pain, fever, or any other new/concerning symptoms then return to the ER.  Otherwise follow-up closely with your primary care physician and call the general surgery office listed.

## 2021-02-21 NOTE — ED Triage Notes (Signed)
Patient sent to ED by PCP for further evaluation of possible rectal tear that was seen on recent imaging. Patient alert, oriented, and in no apparent distress at this time.

## 2021-02-22 ENCOUNTER — Encounter: Payer: Self-pay | Admitting: Internal Medicine

## 2021-02-23 ENCOUNTER — Encounter: Payer: Self-pay | Admitting: Internal Medicine

## 2021-02-23 ENCOUNTER — Other Ambulatory Visit: Payer: Self-pay | Admitting: Internal Medicine

## 2021-02-23 DIAGNOSIS — G893 Neoplasm related pain (acute) (chronic): Secondary | ICD-10-CM

## 2021-02-23 MED ORDER — HYDROCODONE-ACETAMINOPHEN 5-325 MG PO TABS: 1.0000 | ORAL_TABLET | Freq: Four times a day (QID) | ORAL | 0 refills | Status: DC | PRN

## 2021-02-25 ENCOUNTER — Encounter: Payer: Self-pay | Admitting: Internal Medicine

## 2021-02-26 ENCOUNTER — Telehealth: Payer: Self-pay

## 2021-02-26 NOTE — Progress Notes (Signed)
[place] [phone]   Fax:([phone]) [place] NOTE  Patient Care Team: Janith Lima, MD as PCP - General (Internal Medicine) Pieter Partridge, DO as Consulting Physician (Neurology) Cira Rue, RN as Oncology Nurse Navigator  Hematological/Oncological History #  Stage T1c adenocarcinoma of the prostate with Gleason score of 4+4, and PSA of 13.9 1) 12/07/2020: Underwent prostate radioactive seed implantation 2) 01/03/2021-02/07/2021: Received IMRT 45 Gy in 25 Fx.   #Anemia: 1) Prior labs: -02/13/2021: WBC 8.8, Hgb 10.9 (L), MCV 84.7, Plt 327, Vitamin B12 487, Folate >24.4 -02/15/2021: Iron 27, Ferritin 841.4 -02/21/2021: WBC 7.5, Hgb 10.1 (L), Plt 343   CHIEF COMPLAINTS/PURPOSE OF CONSULTATION:  "Normocytic anemia"  HISTORY OF PRESENTING ILLNESS:  Devon Barron 60 y.o. male with medical history significant for prostate cancer s/p radioactive seed implantation and 25 Fx of radiation, T2DM, hyperlipidemia and multiple sclerosis. Patient is unaccompanied for this visit.   On exam today, Devon Barron reports fatigue but is able to complete his ADLs on his own. He lost 15-20 lbs since seed placement but now has improving appetite. Patient denies any nausea, vomiting or abdominal pain. He continues to have rectal pain since seed placement. He takes Norco every 6 hours as needed with improvement of pain. He has constipation with small volume of stool with each bowel movement. He is currently on miralax once daily. Patient denies easy bruising or signs of bleeding including hematochezia, melena or hematuria. Patient denies any fevers, chills, night sweats, shortness of breath, chest pain or cough. He has no other complaints. Rest of the 10 point ROS is below.   MEDICAL HISTORY:  Past Medical History:  Diagnosis Date   DM type 2 (diabetes mellitus, type 2) ([place])    Hyperlipidemia    MS (multiple sclerosis) ([place])    Prostate cancer ([place])     SURGICAL  HISTORY: Past Surgical History:  Procedure Laterality Date   CYSTOSCOPY  12/07/2020   Procedure: CYSTOSCOPY FLEXIBLE;  Surgeon: Ardis Hughs, MD;  Location: [facility];  Service: Urology;;   DEVIATED SETUM SURGERY  25 YRS AGO   HERNIA REPAIR  03/2013   INGUINAL   PROSTATE BIOPSY     RADIOACTIVE SEED IMPLANT N/A 12/07/2020   Procedure: RADIOACTIVE SEED IMPLANT/BRACHYTHERAPY IMPLANT;  Surgeon: Ardis Hughs, MD;  Location: [facility];  Service: Urology;  Laterality: N/A;   SPACE OAR INSTILLATION N/A 12/07/2020   Procedure: SPACE OAR INSTILLATION;  Surgeon: Ardis Hughs, MD;  Location: [facility];  Service: Urology;  Laterality: N/A;    SOCIAL HISTORY: Social History   Socioeconomic History   Marital status: Single    Spouse name: Not on file   Number of children: 1   Years of education: Not on file   Highest education level: Some college, no degree  Occupational History   Occupation: DRIVER    Employer: [employer]  Tobacco Use   Smoking status: Never   Smokeless tobacco: Never  Vaping Use   Vaping Use: Never used  Substance and Sexual Activity   Alcohol use: Yes    Alcohol/week: 5.0 standard drinks    Types: 5 Cans of beer per week    Comment: occasional   Drug use: No   Sexual activity: Yes    Partners: Female  Other Topics Concern   Not on file  Social History Narrative   Patient is right-handed. His grown son lives with him in a 2 level  home. He uses a home exercise machine 2 x weekly.   Social Determinants of Health   Financial Resource Strain: Not on file  Food Insecurity: No Food Insecurity   Worried About Charity fundraiser in the Last Year: Never true   Ran Out of Food in the Last Year: Never true  Transportation Needs: No Transportation Needs   Lack of Transportation (Medical): No   Lack of Transportation (Non-Medical): No  Physical Activity: Not on file  Stress: Not on file  Social Connections: Not  on file  Intimate Partner Violence: Not on file    FAMILY HISTORY: Family History  Problem Relation Age of Onset   Hypertension Mother    Ovarian cancer Mother    Hypertension Father    Prostate cancer Father    Diabetes Sister    Diabetes Brother    Ataxia Neg Hx    Chorea Neg Hx    Dementia Neg Hx    Mental retardation Neg Hx    Migraines Neg Hx    Multiple sclerosis Neg Hx    Neurofibromatosis Neg Hx    Neuropathy Neg Hx    Parkinsonism Neg Hx    Seizures Neg Hx    Stroke Neg Hx    Early death Neg Hx    Cancer Neg Hx    Alcohol abuse Neg Hx    Hearing loss Neg Hx    Heart disease Neg Hx    Hyperlipidemia Neg Hx    Kidney disease Neg Hx    Breast cancer Neg Hx    Colon cancer Neg Hx    Pancreatic cancer Neg Hx     ALLERGIES:  has No Known Allergies.  MEDICATIONS:  Current Outpatient Medications  Medication Sig Dispense Refill   amoxicillin-clavulanate (AUGMENTIN) 875-125 MG tablet Take 1 tablet by mouth 2 (two) times daily for 10 days. 20 tablet 0   cholecalciferol (VITAMIN D) 1000 UNITS tablet Take 1,000 Units by mouth daily. VIT D 3 2000 UNITS TAKES 2 TABS = 4000 UNITS     clobetasol cream (TEMOVATE) [id number] % Apply 1 application topically daily as needed (itching).     Empagliflozin-metFORMIN HCl (SYNJARDY) 5-500 MG TABS Take 1 tablet by mouth 2 (two) times daily. 180 tablet 0   Ferric Maltol (ACCRUFER) 30 MG CAPS Take 1 capsule by mouth in the morning and at bedtime. (Patient not taking: No sig reported) 180 capsule 1   HYDROcodone-acetaminophen (NORCO) 5-325 MG tablet Take 1 tablet by mouth every 6 (six) hours as needed. 75 tablet 0   indapamide (LOZOL) 1.25 MG tablet TAKE 1 TABLET(1.25 MG) BY MOUTH DAILY (Patient taking differently: Take 1.25 mg by mouth daily.) 90 tablet 1   interferon beta-1a (REBIF) 44 MCG/0.5ML SOSY injection INJECT ONE SYRINGE SUBCUTANEOUSLY THREE TIMES PER WEEK. KEEP REFRIGERATED. (Patient taking differently: Inject 44 mcg into the skin 3  (three) times a week. Sunday, Tuesday, Thursday. KEEP REFRIGERATED.) 0.5 mL 5   meloxicam (MOBIC) 15 MG tablet Take 15 mg by mouth daily.     Multiple Vitamins-Minerals (MULTIVITAMIN WITH MINERALS) tablet Take 1 tablet by mouth daily.     rosuvastatin (CRESTOR) 10 MG tablet Take 1 tablet (10 mg total) by mouth daily. 90 tablet 1   sulfamethoxazole-trimethoprim (BACTRIM DS) 800-160 MG tablet Take 1 tablet by mouth 2 (two) times daily. 60 tablet 0   tamsulosin (FLOMAX) 0.4 MG CAPS capsule Take 1 capsule (0.4 mg total) by mouth daily. (Patient taking differently: Take 0.4 mg by mouth  at bedtime.) 7 capsule 0   No current facility-administered medications for this visit.    REVIEW OF SYSTEMS:   Constitutional: ( - ) fevers, ( - )  chills , ( - ) night sweats Eyes: ( - ) blurriness of vision, ( - ) double vision, ( - ) watery eyes Ears, nose, mouth, throat, and face: ( - ) mucositis, ( - ) sore throat Respiratory: ( - ) cough, ( - ) dyspnea, ( - ) wheezes Cardiovascular: ( - ) palpitation, ( - ) chest discomfort, ( - ) lower extremity swelling Gastrointestinal:  ( - ) nausea, ( - ) heartburn, ( - ) change in bowel habits Skin: ( - ) abnormal skin rashes Lymphatics: ( - ) new lymphadenopathy, ( - ) easy bruising Neurological: ( - ) numbness, ( - ) tingling, ( - ) new weaknesses Behavioral/Psych: ( - ) mood change, ( - ) new changes  All other systems were reviewed with the patient and are negative.  PHYSICAL EXAMINATION: ECOG PERFORMANCE STATUS: 1 - Symptomatic but completely ambulatory  There were no vitals filed for this visit. There were no vitals filed for this visit.  GENERAL: well appearing male in NAD  SKIN: skin color, texture, turgor are normal, no rashes or significant lesions EYES: conjunctiva are pink and non-injected, sclera clear OROPHARYNX: no exudate, no erythema; lips, buccal mucosa, and tongue normal  LYMPH:  no palpable lymphadenopathy in the cervical or supraclavicular  lymph nodes.  LUNGS: clear to auscultation and percussion with normal breathing effort HEART: regular rate & rhythm and no murmurs and no lower extremity edema ABDOMEN: soft, non-tender, non-distended, normal bowel sounds Musculoskeletal: no cyanosis of digits and no clubbing  PSYCH: alert & oriented x 3, fluent speech NEURO: no focal motor/sensory deficits  LABORATORY DATA:  I have reviewed the data as listed CBC Latest Ref Rng & Units 02/27/2021 02/21/2021 02/14/2021  WBC 4.0 - 10.5 K/uL 6.0 7.5 7.4  Hemoglobin 13.0 - 17.0 g/dL 10.0(L) 10.1(L) 10.4(L)  Hematocrit 39.0 - 52.0 % 30.5(L) 31.8(L) 31.2(L)  Platelets 150 - 400 K/uL 325 343 321.0    CMP Latest Ref Rng & Units 02/21/2021 02/14/2021 02/13/2021  Glucose 70 - 99 mg/dL 140(H) 112(H) 93  BUN 6 - 20 mg/dL '8 13 14  '$ Creatinine 0.61 - 1.24 mg/dL 0.91 0.85 0.94  Sodium 135 - 145 mmol/L 137 139 138  Potassium 3.5 - 5.1 mmol/L 3.8 3.6 3.8  Chloride 98 - 111 mmol/L 105 100 99  CO2 22 - 32 mmol/L '26 30 30  '$ Calcium 8.9 - 10.3 mg/dL 9.7 10.0 10.1  Total Protein 6.5 - 8.1 g/dL 8.0 8.3 -  Total Bilirubin 0.3 - 1.2 mg/dL 0.4 0.3 -  Alkaline Phos 38 - 126 U/L 50 48 -  AST 15 - 41 U/L 18 16 -  ALT 0 - 44 U/L 19 16 -   RADIOGRAPHIC STUDIES: I have personally reviewed the radiological images as listed and agreed with the findings in the report. CT Abdomen Pelvis W Contrast  Result Date: 02/20/2021 CLINICAL DATA:  Abdominal abscess/infection suspected. Several months hx s/p Seeds implant in May [id number] of Periumbilical abdominal tenderness without rebound tenderness- He has pyuria and neutrophilia. 18 lb wt loss, constipation. screen for metastatic disease, rectal mass, abscess EXAM: CT ABDOMEN AND PELVIS WITH CONTRAST TECHNIQUE: Multidetector CT imaging of the abdomen and pelvis was performed using the standard protocol following bolus administration of intravenous contrast. CONTRAST:  110m OMNIPAQUE IOHEXOL 300 MG/ML  SOLN  COMPARISON:  06/21/2020  FINDINGS: Lower chest: Unremarkable. Hepatobiliary: Tiny hypodensity in the dome of the left liver is stable. 2nd tiny hypodensity inferior right liver adjacent to the gallbladder fossa also unchanged. There is no evidence for gallstones, gallbladder wall thickening, or pericholecystic fluid. No intrahepatic or extrahepatic biliary dilation. Pancreas: No focal mass lesion. No dilatation of the main duct. No intraparenchymal cyst. No peripancreatic edema. Spleen: No splenomegaly. No focal mass lesion. Adrenals/Urinary Tract: No adrenal nodule or mass. 14 mm exophytic lesion interpolar right kidney was 13 mm previously and is likely a cyst. Left kidney unremarkable. No evidence for hydroureter. The urinary bladder appears normal for the degree of distention. Stomach/Bowel: Stomach is unremarkable. No gastric wall thickening. No evidence of outlet obstruction. Duodenum is normally positioned as is the ligament of Treitz. No small bowel wall thickening. No small bowel dilatation. The terminal ileum is normal. The appendix is normal. Ascending transverse and descending colon unremarkable. Circumferential wall thickening noted in the low rectum with gas and contrast material that appears to be anterior to the anterior rectal wall in the extraperitoneal space between the rectum and the prostate gland (see coronal image 60/6 and axial 76/2). Perirectal edema noted. Vascular/Lymphatic: No abdominal aortic aneurysm. No abdominal lymphadenopathy. No pelvic sidewall lymphadenopathy. Reproductive: Prostate gland largely obscured by beam hardening artifact from brachytherapy seeds. Other: No intraperitoneal free fluid. Musculoskeletal: No worrisome lytic or sclerotic osseous abnormality. IMPRESSION: 1. Circumferential wall thickening in the rectum with perirectal edema and apparent contrast material and gas anterior to the anterior wall the rectum in the extraperitoneal space between the rectum in the prostate gland. This region  is difficult to evaluate by CT, made even more difficult by the presence of artifact from the brachytherapy seeds in the prostate gland. Nevertheless, close correlation for rectal leak recommended. Electronically Signed   By: Misty Stanley M.D.   On: 02/20/2021 11:40    ASSESSMENT & PLAN Devon Barron is a male who presents to the clinic for normocytic anemia. Patient recently underwent radioactive seed implantation and completed radiation therapy for prostate cancer.  I reviewed the recent labs from 02/21/2021 that revealed hemoglobin of 10.1 and ferritin levels of 841.4. This is likely in the setting of acute inflammatory process secondary to recent radiation therapy that was complicated by rectal perforation and proctocolitis.   #Normocytic anemia: --Likely secondary to acute inflammation secondary to radiation therapy that was complicated by rectal perforation and proctocolitis. --CT scan from 02/20/2021 did not show any evidence of liver disease.  --Labs from today show persistent anemia with hemoglobin of 10.0, MCV 85.2. Iron was normal, saturation was on low end of normal at 21% and ferritin was elevated at 898.  --Currently on ferrous sulfate 325 mg once daily. Okay to continue but do not recommend IV iron infusions at this time.  --Monitor for now with repeat labs in 3 weeks and return to clinic in 6 weeks.   Orders Placed This Encounter  Procedures   CBC with Differential ([place] Only)    Standing Status:   Future    Number of Occurrences:   1    Standing Expiration Date:   02/26/2022   Retic Panel    Standing Status:   Future    Number of Occurrences:   1    Standing Expiration Date:   02/26/2022   Ferritin    Standing Status:   Future    Number of Occurrences:   1    Standing Expiration Date:  02/26/2022   Iron and TIBC    Standing Status:   Future    Number of Occurrences:   1    Standing Expiration Date:   02/26/2022   Save Smear [facility])    Standing Status:   Future     Number of Occurrences:   1    Standing Expiration Date:   02/27/2022    All questions were answered. The patient knows to call the clinic with any problems, questions or concerns.  I have spent a total of 45 minutes minutes of face-to-face and non-face-to-face time, preparing to see the patient, obtaining and/or reviewing separately obtained history, performing a medically appropriate examination, counseling and educating the patient, ordering tests,  documenting clinical information in the electronic health record, and care coordination.   Dede Query, PA-C Department of Hematology/Oncology [place] at [facility] Phone: [phone]

## 2021-02-27 ENCOUNTER — Encounter: Payer: Self-pay | Admitting: Internal Medicine

## 2021-02-27 ENCOUNTER — Other Ambulatory Visit: Payer: Self-pay

## 2021-02-27 ENCOUNTER — Inpatient Hospital Stay: Payer: BC Managed Care – PPO | Attending: Physician Assistant | Admitting: Physician Assistant

## 2021-02-27 ENCOUNTER — Encounter: Payer: Self-pay | Admitting: Physician Assistant

## 2021-02-27 ENCOUNTER — Inpatient Hospital Stay: Payer: BC Managed Care – PPO

## 2021-02-27 DIAGNOSIS — Z833 Family history of diabetes mellitus: Secondary | ICD-10-CM | POA: Diagnosis not present

## 2021-02-27 DIAGNOSIS — Z79899 Other long term (current) drug therapy: Secondary | ICD-10-CM

## 2021-02-27 DIAGNOSIS — Z7289 Other problems related to lifestyle: Secondary | ICD-10-CM

## 2021-02-27 DIAGNOSIS — G35 Multiple sclerosis: Secondary | ICD-10-CM | POA: Insufficient documentation

## 2021-02-27 DIAGNOSIS — Z8042 Family history of malignant neoplasm of prostate: Secondary | ICD-10-CM

## 2021-02-27 DIAGNOSIS — Z8249 Family history of ischemic heart disease and other diseases of the circulatory system: Secondary | ICD-10-CM

## 2021-02-27 DIAGNOSIS — E785 Hyperlipidemia, unspecified: Secondary | ICD-10-CM | POA: Insufficient documentation

## 2021-02-27 DIAGNOSIS — R634 Abnormal weight loss: Secondary | ICD-10-CM

## 2021-02-27 DIAGNOSIS — K6289 Other specified diseases of anus and rectum: Secondary | ICD-10-CM | POA: Insufficient documentation

## 2021-02-27 DIAGNOSIS — D649 Anemia, unspecified: Secondary | ICD-10-CM

## 2021-02-27 DIAGNOSIS — Z923 Personal history of irradiation: Secondary | ICD-10-CM | POA: Diagnosis not present

## 2021-02-27 DIAGNOSIS — Z8041 Family history of malignant neoplasm of ovary: Secondary | ICD-10-CM | POA: Diagnosis not present

## 2021-02-27 DIAGNOSIS — Z9079 Acquired absence of other genital organ(s): Secondary | ICD-10-CM | POA: Diagnosis not present

## 2021-02-27 DIAGNOSIS — K5900 Constipation, unspecified: Secondary | ICD-10-CM

## 2021-02-27 DIAGNOSIS — C61 Malignant neoplasm of prostate: Secondary | ICD-10-CM | POA: Insufficient documentation

## 2021-02-27 DIAGNOSIS — E119 Type 2 diabetes mellitus without complications: Secondary | ICD-10-CM | POA: Diagnosis not present

## 2021-02-27 LAB — CBC WITH DIFFERENTIAL (CANCER CENTER ONLY)
Abs Immature Granulocytes: 0.04 10*3/uL (ref 0.00–0.07)
Basophils Absolute: 0 10*3/uL (ref 0.0–0.1)
Basophils Relative: 1 %
Eosinophils Absolute: 0.1 10*3/uL (ref 0.0–0.5)
Eosinophils Relative: 1 %
HCT: 30.5 % — ABNORMAL LOW (ref 39.0–52.0)
Hemoglobin: 10 g/dL — ABNORMAL LOW (ref 13.0–17.0)
Immature Granulocytes: 1 %
Lymphocytes Relative: 6 %
Lymphs Abs: 0.4 10*3/uL — ABNORMAL LOW (ref 0.7–4.0)
MCH: 27.9 pg (ref 26.0–34.0)
MCHC: 32.8 g/dL (ref 30.0–36.0)
MCV: 85.2 fL (ref 80.0–100.0)
Monocytes Absolute: 0.5 10*3/uL (ref 0.1–1.0)
Monocytes Relative: 8 %
Neutro Abs: 5 10*3/uL (ref 1.7–7.7)
Neutrophils Relative %: 83 %
Platelet Count: 325 10*3/uL (ref 150–400)
RBC: 3.58 MIL/uL — ABNORMAL LOW (ref 4.22–5.81)
RDW: 14.6 % (ref 11.5–15.5)
WBC Count: 6 10*3/uL (ref 4.0–10.5)
nRBC: 0 % (ref 0.0–0.2)

## 2021-02-27 LAB — FERRITIN: Ferritin: 898 ng/mL — ABNORMAL HIGH (ref 24–336)

## 2021-02-27 LAB — RETIC PANEL
Immature Retic Fract: 15.2 % (ref 2.3–15.9)
RBC.: 3.69 MIL/uL — ABNORMAL LOW (ref 4.22–5.81)
Retic Count, Absolute: 50.2 10*3/uL (ref 19.0–186.0)
Retic Ct Pct: 1.4 % (ref 0.4–3.1)
Reticulocyte Hemoglobin: 32.5 pg (ref >27.9)

## 2021-02-27 LAB — IRON AND TIBC
Iron: 50 ug/dL (ref 42–163)
Saturation Ratios: 21 % (ref 20–55)
TIBC: 234 ug/dL (ref 202–409)
UIBC: 184 ug/dL (ref 117–376)

## 2021-02-27 LAB — SAVE SMEAR(SSMR), FOR PROVIDER SLIDE REVIEW

## 2021-02-27 LAB — VITAMIN B1: Vitamin B1 (Thiamine): 10 nmol/L (ref 8–30)

## 2021-02-28 ENCOUNTER — Encounter: Payer: Self-pay | Admitting: Physician Assistant

## 2021-03-01 ENCOUNTER — Ambulatory Visit (INDEPENDENT_AMBULATORY_CARE_PROVIDER_SITE_OTHER): Payer: BC Managed Care – PPO | Admitting: Gastroenterology

## 2021-03-01 VITALS — BP 132/72 | HR 66 | Ht 69.0 in | Wt 188.0 lb

## 2021-03-01 DIAGNOSIS — K6289 Other specified diseases of anus and rectum: Secondary | ICD-10-CM | POA: Diagnosis not present

## 2021-03-01 DIAGNOSIS — D509 Iron deficiency anemia, unspecified: Secondary | ICD-10-CM

## 2021-03-01 DIAGNOSIS — D649 Anemia, unspecified: Secondary | ICD-10-CM | POA: Insufficient documentation

## 2021-03-01 NOTE — Patient Instructions (Signed)
If you are age 60 or older, your body mass index should be between 23-30. Your Body mass index is 27.76 kg/m. If this is out of the aforementioned range listed, please consider follow up with your Primary Care Provider.  If you are age 40 or younger, your body mass index should be between 19-25. Your Body mass index is 27.76 kg/m. If this is out of the aformentioned range listed, please consider follow up with your Primary Care Provider.   __________________________________________________________  The [facility] [facility] providers would like to encourage you to use [facility] to communicate with providers for non-urgent requests or questions.  Due to long hold times on the telephone, sending your provider a message by [facility] may be a faster and more efficient way to get a response.  Please allow 48 business hours for a response.  Please remember that this is for non-urgent requests.   It was a pleasure to see you today!  Thank you for trusting me with your gastrointestinal care!

## 2021-03-01 NOTE — Progress Notes (Signed)
[facility] Consult Note:  History: Devon Barron 03/01/2021  Referring provider: Janith Lima, MD  Reason for consult/chief complaint: Anemia (Recently dx with IDA with PCP. Pt reports extreme fatigue. ) and Blood In Stools (Some intermittent dark blood in stool. Pt states he mostly constipated. )   Subjective  HPI:  This is a pleasant 60 year old man referred by primary care for normocytic anemia and low iron level. He was diagnosed with prostate cancer months ago and had treatment as outlined below.  His last colonoscopy was in 2015 in [facility] (report on file).  Normal colonoscopy except internal hemorrhoids.  Today he is most concerned about his persistent rectal pain that is there all the time and hurts worse to sit.  He finds it difficult to work and he wonders when he will be able to return to work.  He thought he was going to get IV iron when sent to hematology, he has he feels that would make him feel better sooner and have more energy. He is taking an iron tablet once a day, has some occasional constipation but is treating through that with MiraLAX.  There is sometimes some blood on the paper in the last few weeks.  Hematology consultation note from 02/27/2021 reviewed "Devon Barron is a male who presents to the clinic for normocytic anemia. Patient recently underwent radioactive seed implantation and completed radiation therapy for prostate cancer. I reviewed the recent labs from 02/21/2021 that revealed hemoglobin of 10.1 and ferritin levels of 841.4. This is likely in the setting of acute inflammatory process secondary to recent radiation therapy that was complicated by rectal perforation and proctocolitis.   #Normocytic anemia: --Likely secondary to acute inflammation secondary to radiation therapy that was complicated by rectal perforation and proctocolitis. --CT scan from 02/20/2021 did not show any evidence of liver disease. --Labs from today  show persistent anemia with hemoglobin of 10.0, MCV 85.2. Iron was normal, saturation was on low end of normal at 21% and ferritin was elevated at 898. --Currently on ferrous sulfate 325 mg once daily. Okay to continue but do not recommend IV iron infusions at this time. --Monitor for now with repeat labs in 3 weeks and return to clinic in 6 weeks. "  ___________________  Patient was in the ED 02/21/2021 for rectal pain that has been persistent and perhaps slowly worsening since his prostate radioactive seed placement. Devon Barron)  Patient had apparently used an enema and pain was worse, there was concern for possible "rectal tear".  ED provider apparently talked to general surgery they recommended 10 days of Augmentin and pain medication.  ROS:  Review of Systems  Constitutional:  Positive for fatigue. Negative for appetite change and unexpected weight change.  HENT:  Negative for mouth sores and voice change.   Eyes:  Negative for pain and redness.  Respiratory:  Negative for cough and shortness of breath.   Cardiovascular:  Negative for chest pain and palpitations.  Genitourinary:  Negative for dysuria and hematuria.  Musculoskeletal:  Negative for arthralgias and myalgias.  Skin:  Negative for pallor and rash.  Neurological:  Negative for weakness and headaches.  Hematological:  Negative for adenopathy.    Past Medical History: Past Medical History:  Diagnosis Date   DM type 2 (diabetes mellitus, type 2) ([place])    Hyperlipidemia    MS (multiple sclerosis) ([place])    Prostate cancer ([place])      Past Surgical History: Past Surgical History:  Procedure Laterality  Date   CYSTOSCOPY  12/07/2020   Procedure: CYSTOSCOPY FLEXIBLE;  Surgeon: Ardis Hughs, MD;  Location: [facility];  Service: Urology;;   DEVIATED SETUM SURGERY  25 YRS AGO   HERNIA REPAIR  03/2013   INGUINAL   PROSTATE BIOPSY     RADIOACTIVE SEED IMPLANT N/A 12/07/2020   Procedure: RADIOACTIVE SEED  IMPLANT/BRACHYTHERAPY IMPLANT;  Surgeon: Ardis Hughs, MD;  Location: [facility];  Service: Urology;  Laterality: N/A;   SPACE OAR INSTILLATION N/A 12/07/2020   Procedure: SPACE OAR INSTILLATION;  Surgeon: Ardis Hughs, MD;  Location: [facility];  Service: Urology;  Laterality: N/A;     Family History: Family History  Problem Relation Age of Onset   Hypertension Mother    Ovarian cancer Mother    Hypertension Father    Prostate cancer Father    Diabetes Sister    Diabetes Brother    Ataxia Neg Hx    Chorea Neg Hx    Dementia Neg Hx    Mental retardation Neg Hx    Migraines Neg Hx    Multiple sclerosis Neg Hx    Neurofibromatosis Neg Hx    Neuropathy Neg Hx    Parkinsonism Neg Hx    Seizures Neg Hx    Stroke Neg Hx    Early death Neg Hx    Cancer Neg Hx    Alcohol abuse Neg Hx    Hearing loss Neg Hx    Heart disease Neg Hx    Hyperlipidemia Neg Hx    Kidney disease Neg Hx    Breast cancer Neg Hx    Colon cancer Neg Hx    Pancreatic cancer Neg Hx     Social History: Social History   Socioeconomic History   Marital status: Single    Spouse name: Not on file   Number of children: 1   Years of education: Not on file   Highest education level: Some college, no degree  Occupational History   Occupation: DRIVER    Employer: [employer]  Tobacco Use   Smoking status: Never   Smokeless tobacco: Never  Vaping Use   Vaping Use: Never used  Substance and Sexual Activity   Alcohol use: Yes    Alcohol/week: 5.0 standard drinks    Types: 5 Cans of beer per week    Comment: occasional   Drug use: No   Sexual activity: Yes    Partners: Female  Other Topics Concern   Not on file  Social History Narrative   Patient is right-handed. His grown son lives with him in a 2 level home. He uses a home exercise machine 2 x weekly.   Social Determinants of Health   Financial Resource Strain: Not on file  Food Insecurity: No Food Insecurity    Worried About Charity fundraiser in the Last Year: Never true   Ran Out of Food in the Last Year: Never true  Transportation Needs: No Transportation Needs   Lack of Transportation (Medical): No   Lack of Transportation (Non-Medical): No  Physical Activity: Not on file  Stress: Not on file  Social Connections: Not on file    Allergies: No Known Allergies  Outpatient Meds: Current Outpatient Medications  Medication Sig Dispense Refill   amoxicillin-clavulanate (AUGMENTIN) 875-125 MG tablet Take 1 tablet by mouth 2 (two) times daily for 10 days. 20 tablet 0   cholecalciferol (VITAMIN D) 1000 UNITS tablet Take 1,000 Units by mouth daily.  VIT D 3 2000 UNITS TAKES 2 TABS = 4000 UNITS     Empagliflozin-metFORMIN HCl (SYNJARDY) 5-500 MG TABS Take 1 tablet by mouth 2 (two) times daily. 180 tablet 0   Ferric Maltol (ACCRUFER) 30 MG CAPS Take 1 capsule by mouth in the morning and at bedtime. 180 capsule 1   HYDROcodone-acetaminophen (NORCO) 5-325 MG tablet Take 1 tablet by mouth every 6 (six) hours as needed. 75 tablet 0   indapamide (LOZOL) 1.25 MG tablet TAKE 1 TABLET(1.25 MG) BY MOUTH DAILY (Patient taking differently: Take 1.25 mg by mouth daily.) 90 tablet 1   interferon beta-1a (REBIF) 44 MCG/0.5ML SOSY injection INJECT ONE SYRINGE SUBCUTANEOUSLY THREE TIMES PER WEEK. KEEP REFRIGERATED. (Patient taking differently: Inject 44 mcg into the skin 3 (three) times a week. Sunday, Tuesday, Thursday. KEEP REFRIGERATED.) 0.5 mL 5   meloxicam (MOBIC) 15 MG tablet Take 15 mg by mouth daily.     Multiple Vitamins-Minerals (MULTIVITAMIN WITH MINERALS) tablet Take 1 tablet by mouth daily.     rosuvastatin (CRESTOR) 10 MG tablet Take 1 tablet (10 mg total) by mouth daily. 90 tablet 1   sulfamethoxazole-trimethoprim (BACTRIM DS) 800-160 MG tablet Take 1 tablet by mouth 2 (two) times daily. 60 tablet 0   tamsulosin (FLOMAX) 0.4 MG CAPS capsule Take 1 capsule (0.4 mg total) by mouth daily. (Patient taking  differently: Take 0.4 mg by mouth at bedtime.) 7 capsule 0   No current facility-administered medications for this visit.      ___________________________________________________________________ Objective   Exam:  BP 132/72   Pulse 66   Ht '5\' 9"'$  (1.753 m)   Wt 188 lb (85.3 kg)   BMI 27.76 kg/m  Wt Readings from Last 3 Encounters:  03/01/21 188 lb (85.3 kg)  02/14/21 193 lb (87.5 kg)  02/13/21 193 lb (87.5 kg)    General: Pleasant, halting speech pattern.  Visibly uncomfortable sitting, changes position frequently. Eyes: sclera anicteric, no redness ENT: oral mucosa moist without lesions, no cervical or supraclavicular lymphadenopathy CV: RRR without murmur, S1/S2, no JVD, no peripheral edema Resp: clear to auscultation bilaterally, normal RR and effort noted GI: soft, no tenderness, with active bowel sounds. No guarding or palpable organomegaly noted. Skin; warm and dry, no rash or jaundice noted Neuro: awake, alert and oriented x 3. Normal gross motor function and fluent speech He declined a rectal exam today. Labs:  CBC Latest Ref Rng & Units 02/27/2021 02/21/2021 02/14/2021  WBC 4.0 - 10.5 K/uL 6.0 7.5 7.4  Hemoglobin 13.0 - 17.0 g/dL 10.0(L) 10.1(L) 10.4(L)  Hematocrit 39.0 - 52.0 % 30.5(L) 31.8(L) 31.2(L)  Platelets 150 - 400 K/uL 325 343 321.0  MCV and RDW normal  Hemoglobin was normal on 12/04/2020  CMP Latest Ref Rng & Units 02/21/2021 02/14/2021 02/13/2021  Glucose 70 - 99 mg/dL 140(H) 112(H) 93  BUN 6 - 20 mg/dL '8 13 14  '$ Creatinine 0.61 - 1.24 mg/dL 0.91 0.85 0.94  Sodium 135 - 145 mmol/L 137 139 138  Potassium 3.5 - 5.1 mmol/L 3.8 3.6 3.8  Chloride 98 - 111 mmol/L 105 100 99  CO2 22 - 32 mmol/L '26 30 30  '$ Calcium 8.9 - 10.3 mg/dL 9.7 10.0 10.1  Total Protein 6.5 - 8.1 g/dL 8.0 8.3 -  Total Bilirubin 0.3 - 1.2 mg/dL 0.4 0.3 -  Alkaline Phos 38 - 126 U/L 50 48 -  AST 15 - 41 U/L 18 16 -  ALT 0 - 44 U/L 19 16 -   Folate normal  Iron 27, ferritin 841 on  02/15/2021  Iron 50, TIBC 234 (21% saturation), ferritin 898 on 02/28/2019  Radiologic Studies:  CLINICAL DATA:  Abdominal abscess/infection suspected. Several months hx s/p Seeds implant in May [id number] of Periumbilical abdominal tenderness without rebound tenderness- He has pyuria and neutrophilia. 18 lb wt loss, constipation. screen for metastatic disease, rectal mass, abscess   EXAM: CT ABDOMEN AND PELVIS WITH CONTRAST   TECHNIQUE: Multidetector CT imaging of the abdomen and pelvis was performed using the standard protocol following bolus administration of intravenous contrast.   CONTRAST:  167m OMNIPAQUE IOHEXOL 300 MG/ML  SOLN   COMPARISON:  06/21/2020   FINDINGS: Lower chest: Unremarkable.   Hepatobiliary: Tiny hypodensity in the dome of the left liver is stable. 2nd tiny hypodensity inferior right liver adjacent to the gallbladder fossa also unchanged. There is no evidence for gallstones, gallbladder wall thickening, or pericholecystic fluid. No intrahepatic or extrahepatic biliary dilation.   Pancreas: No focal mass lesion. No dilatation of the main duct. No intraparenchymal cyst. No peripancreatic edema.   Spleen: No splenomegaly. No focal mass lesion.   Adrenals/Urinary Tract: No adrenal nodule or mass. 14 mm exophytic lesion interpolar right kidney was 13 mm previously and is likely a cyst. Left kidney unremarkable. No evidence for hydroureter. The urinary bladder appears normal for the degree of distention.   Stomach/Bowel: Stomach is unremarkable. No gastric wall thickening. No evidence of outlet obstruction. Duodenum is normally positioned as is the ligament of Treitz. No small bowel wall thickening. No small bowel dilatation. The terminal ileum is normal. The appendix is normal. Ascending transverse and descending colon unremarkable. Circumferential wall thickening noted in the low rectum with gas and contrast material that appears to be anterior to the  anterior rectal wall in the extraperitoneal space between the rectum and the prostate gland (see coronal image 60/6 and axial 76/2). Perirectal edema noted.   Vascular/Lymphatic: No abdominal aortic aneurysm. No abdominal lymphadenopathy. No pelvic sidewall lymphadenopathy.   Reproductive: Prostate gland largely obscured by beam hardening artifact from brachytherapy seeds.   Other: No intraperitoneal free fluid.   Musculoskeletal: No worrisome lytic or sclerotic osseous abnormality.   IMPRESSION: 1. Circumferential wall thickening in the rectum with perirectal edema and apparent contrast material and gas anterior to the anterior wall the rectum in the extraperitoneal space between the rectum in the prostate gland. This region is difficult to evaluate by CT, made even more difficult by the presence of artifact from the brachytherapy seeds in the prostate gland. Nevertheless, close correlation for rectal leak recommended.     Electronically Signed   By: EMisty StanleyM.D.   On: 02/20/2021 11:40   Assessment: Encounter Diagnoses  Name Primary?   Iron deficiency anemia, unspecified iron deficiency anemia type Yes   Rectal pain    I agree with a hematology consult and that this persistent normocytic anemia and low iron level (which has now normalized) are from the radiation treatment of his prostate cancer. 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  It would be unwise to perform a colonoscopy at this point, I also think he does not have a chronic blood loss source for his anemia.  Hemoglobin was normal right up until the time of his radiation treatment.   Plan: I will defer to the hematology consult number best management of the iron therapy and when that can be discontinued.  It sounds like they plan to check his blood  counts regularly.  Suryansh is hoping to feel better immediately so he can return to work, but it seems like  it will just take some time for his proctitis to improve.  He finished radiation treatment a few weeks back and hopefully there will be improvement soon.  I will be glad to see him again as needed, particularly if he has persistent iron deficiency anemia despite resolution of his proctitis.  Thank you for the courtesy of this consult.  Please call me with any questions or concerns.  Nelida Meuse III  CC: Referring provider noted above

## 2021-03-03 ENCOUNTER — Encounter: Payer: Self-pay | Admitting: Internal Medicine

## 2021-03-05 ENCOUNTER — Emergency Department (HOSPITAL_COMMUNITY)
Admission: EM | Admit: 2021-03-05 | Discharge: 2021-03-05 | Disposition: A | Discharge status: Home / Self Care | Payer: BC Managed Care – PPO | Attending: Student | Admitting: Student

## 2021-03-05 ENCOUNTER — Encounter (HOSPITAL_COMMUNITY): Payer: Self-pay

## 2021-03-05 ENCOUNTER — Other Ambulatory Visit: Payer: Self-pay

## 2021-03-05 ENCOUNTER — Encounter: Payer: Self-pay | Admitting: Internal Medicine

## 2021-03-05 ENCOUNTER — Emergency Department (HOSPITAL_COMMUNITY): Payer: BC Managed Care – PPO

## 2021-03-05 DIAGNOSIS — N50812 Left testicular pain: Secondary | ICD-10-CM | POA: Insufficient documentation

## 2021-03-05 DIAGNOSIS — N50819 Testicular pain, unspecified: Secondary | ICD-10-CM

## 2021-03-05 DIAGNOSIS — Z5321 Procedure and treatment not carried out due to patient leaving prior to being seen by health care provider: Secondary | ICD-10-CM | POA: Insufficient documentation

## 2021-03-05 LAB — URINALYSIS, ROUTINE W REFLEX MICROSCOPIC
Bacteria, UA: NONE SEEN
Glucose, UA: NEGATIVE mg/dL
Hgb urine dipstick: NEGATIVE
Ketones, ur: 5 mg/dL — AB
Leukocytes,Ua: NEGATIVE
Nitrite: NEGATIVE
Protein, ur: 30 mg/dL — AB
Specific Gravity, Urine: 1.045 — ABNORMAL HIGH (ref 1.005–1.030)
pH: 5 (ref 5.0–8.0)

## 2021-03-05 LAB — BASIC METABOLIC PANEL
Anion gap: 9 (ref 5–15)
BUN: 13 mg/dL (ref 6–20)
CO2: 29 mmol/L (ref 22–32)
Calcium: 9.8 mg/dL (ref 8.9–10.3)
Chloride: 99 mmol/L (ref 98–111)
Creatinine, Ser: 0.87 mg/dL (ref 0.61–1.24)
GFR, Estimated: >60 mL/min (ref >60)
Glucose, Bld: 101 mg/dL — ABNORMAL HIGH (ref 70–99)
Potassium: 4 mmol/L (ref 3.5–5.1)
Sodium: 137 mmol/L (ref 135–145)

## 2021-03-05 LAB — CBC WITH DIFFERENTIAL/PLATELET
Abs Immature Granulocytes: 0.02 10*3/uL (ref 0.00–0.07)
Basophils Absolute: 0 10*3/uL (ref 0.0–0.1)
Basophils Relative: 1 %
Eosinophils Absolute: 0 10*3/uL (ref 0.0–0.5)
Eosinophils Relative: 1 %
HCT: 33.7 % — ABNORMAL LOW (ref 39.0–52.0)
Hemoglobin: 10.6 g/dL — ABNORMAL LOW (ref 13.0–17.0)
Immature Granulocytes: 0 %
Lymphocytes Relative: 8 %
Lymphs Abs: 0.4 10*3/uL — ABNORMAL LOW (ref 0.7–4.0)
MCH: 27.5 pg (ref 26.0–34.0)
MCHC: 31.5 g/dL (ref 30.0–36.0)
MCV: 87.5 fL (ref 80.0–100.0)
Monocytes Absolute: 0.9 10*3/uL (ref 0.1–1.0)
Monocytes Relative: 17 %
Neutro Abs: 3.8 10*3/uL (ref 1.7–7.7)
Neutrophils Relative %: 73 %
Platelets: 328 10*3/uL (ref 150–400)
RBC: 3.85 MIL/uL — ABNORMAL LOW (ref 4.22–5.81)
RDW: 15.3 % (ref 11.5–15.5)
WBC: 5.2 10*3/uL (ref 4.0–10.5)
nRBC: 0 % (ref 0.0–0.2)

## 2021-03-05 NOTE — ED Provider Notes (Signed)
Emergency Medicine Provider Triage Evaluation Note  Devon Barron , a 60 y.o. male  was evaluated in triage.  Pt complains of left testicular pain and pain in the head of his penis times a few months, worsening, always in his sleep.  Just completed radiation treatment for prostate cancer.  Review of Systems  Positive: Left testicular pain, penile head pain chills Negative: Nausea, vomiting, diarrhea  Physical Exam  Ht '5\' 9"'$  (1.753 m)   Wt 85.3 kg   BMI 27.76 kg/m  Gen:   Awake, no distress   Resp:  Normal effort  MSK:   Moves extremities without difficulty  Other:  Left lower quadrant tenderness palpation  Medical Decision Making  Medically screening exam initiated at 3:21 PM.  Appropriate orders placed.  Duaine Dredge was informed that the remainder of the evaluation will be completed by another provider, this initial triage assessment does not replace that evaluation, and the importance of remaining in the ED until their evaluation is complete.  This chart was dictated using voice recognition software, Dragon. Despite the best efforts of this provider to proofread and correct errors, errors may still occur which can change documentation meaning.    Aura Dials 03/05/21 1523    Regan Lemming, MD 03/05/21 2033

## 2021-03-05 NOTE — ED Notes (Signed)
Pt called x3 for vital recheck. No response.

## 2021-03-05 NOTE — ED Triage Notes (Signed)
Pt reports groin pain (testicles and penis) for a while now but worse over the past few days. Diagnosed with prostate cancer last year and is receiving treatment. Pt denies any swelling.

## 2021-03-12 ENCOUNTER — Encounter: Payer: Self-pay | Admitting: Physician Assistant

## 2021-03-12 ENCOUNTER — Encounter: Payer: Self-pay | Admitting: Internal Medicine

## 2021-03-13 ENCOUNTER — Encounter: Payer: Self-pay | Admitting: Internal Medicine

## 2021-03-13 ENCOUNTER — Telehealth: Payer: Self-pay | Admitting: *Deleted

## 2021-03-13 NOTE — Telephone Encounter (Signed)
Call made to patient regarding an appt that has been made for him with [facility]. Left message on identified phone advising that pt has an appt with a Dr. Dema Severin at [place] on 03/20/21 @ 10:30 am to evaluate his rectal pain/tear s/p radiation seed implantation. Advised pt to call with any questions .  Also advised that a message has also been sent to Dr. Tammi Klippel. In radiation oncology

## 2021-03-14 ENCOUNTER — Other Ambulatory Visit: Payer: Self-pay | Admitting: Internal Medicine

## 2021-03-14 ENCOUNTER — Telehealth: Payer: Self-pay | Admitting: Internal Medicine

## 2021-03-14 DIAGNOSIS — G893 Neoplasm related pain (acute) (chronic): Secondary | ICD-10-CM

## 2021-03-14 MED ORDER — HYDROCODONE-ACETAMINOPHEN 5-325 MG PO TABS: 1.0000 | ORAL_TABLET | Freq: Four times a day (QID) | ORAL | 0 refills | Status: DC | PRN

## 2021-03-14 NOTE — Telephone Encounter (Signed)
Patient calling in requesting a refill for HYDROcodone-acetaminophen (NORCO) 5-325 MG tablet  Patient says he is in severe pain  Pharmacy:  [facility], [address]  Phone:  [phone] Fax:  [phone]  Callback [phone]

## 2021-03-15 ENCOUNTER — Encounter: Payer: Self-pay | Admitting: Internal Medicine

## 2021-03-15 LAB — PSA: PSA: 0.27

## 2021-03-20 ENCOUNTER — Other Ambulatory Visit: Payer: Self-pay

## 2021-03-20 ENCOUNTER — Inpatient Hospital Stay: Payer: BC Managed Care – PPO | Attending: Physician Assistant

## 2021-03-20 ENCOUNTER — Telehealth: Payer: Self-pay | Admitting: Radiation Oncology

## 2021-03-20 DIAGNOSIS — D649 Anemia, unspecified: Secondary | ICD-10-CM | POA: Diagnosis not present

## 2021-03-20 DIAGNOSIS — C61 Malignant neoplasm of prostate: Secondary | ICD-10-CM | POA: Insufficient documentation

## 2021-03-20 LAB — CBC WITH DIFFERENTIAL (CANCER CENTER ONLY)
Abs Immature Granulocytes: 0.01 10*3/uL (ref 0.00–0.07)
Basophils Absolute: 0 10*3/uL (ref 0.0–0.1)
Basophils Relative: 0 %
Eosinophils Absolute: 0.1 10*3/uL (ref 0.0–0.5)
Eosinophils Relative: 1 %
HCT: 31.5 % — ABNORMAL LOW (ref 39.0–52.0)
Hemoglobin: 10 g/dL — ABNORMAL LOW (ref 13.0–17.0)
Immature Granulocytes: 0 %
Lymphocytes Relative: 10 %
Lymphs Abs: 0.5 10*3/uL — ABNORMAL LOW (ref 0.7–4.0)
MCH: 27.3 pg (ref 26.0–34.0)
MCHC: 31.7 g/dL (ref 30.0–36.0)
MCV: 86.1 fL (ref 80.0–100.0)
Monocytes Absolute: 0.6 10*3/uL (ref 0.1–1.0)
Monocytes Relative: 11 %
Neutro Abs: 4.1 10*3/uL (ref 1.7–7.7)
Neutrophils Relative %: 78 %
Platelet Count: 246 10*3/uL (ref 150–400)
RBC: 3.66 MIL/uL — ABNORMAL LOW (ref 4.22–5.81)
RDW: 15.9 % — ABNORMAL HIGH (ref 11.5–15.5)
WBC Count: 5.3 10*3/uL (ref 4.0–10.5)
nRBC: 0 % (ref 0.0–0.2)

## 2021-03-20 NOTE — Telephone Encounter (Signed)
Called patient to schedule a follow up with Dr. Tammi Klippel. No answer, LVM for return call.

## 2021-03-21 LAB — IRON AND TIBC
Iron: 45 ug/dL (ref 42–163)
Saturation Ratios: 19 % — ABNORMAL LOW (ref 20–55)
TIBC: 238 ug/dL (ref 202–409)
UIBC: 192 ug/dL (ref 117–376)

## 2021-03-21 LAB — FERRITIN: Ferritin: 1160 ng/mL — ABNORMAL HIGH (ref 24–336)

## 2021-03-22 ENCOUNTER — Telehealth: Payer: Self-pay | Admitting: *Deleted

## 2021-03-22 NOTE — Telephone Encounter (Signed)
TCT patient at the request of Dede Query, PA to advise that his iron levels were ok and his HGB is stable though a bit low. Spoke with pt . Advised of the above. Advised to continue his oral iron.  He did ask about IV iron. Advised that his iron stores were acceptable and that IV iron at this time would not be a benefit. He asked about the pain again. He has seen the suregon this week and it was recommended he see Dr. Loletha Carrow for repeat colonoscopy. He will also see Dr. Tammi Klippel Wynelle Fanny PA next week as well. Overall, pt seems frustrated about his situation and wants to know why he hurts and why his HGB hasn't come up. Advised that it is all being worked on. Advised that I would have Dede Query, [place] Call him on Monday or Tuesday next week to discuss further. Encouraged him to keep his appointments.  Pt voiced understanding

## 2021-03-23 ENCOUNTER — Encounter: Payer: Self-pay | Admitting: Urology

## 2021-03-23 NOTE — Progress Notes (Signed)
Patient reports rectal pain of 10/10 (comes & goes), with mild fatigue, dysuria, nocturia x3, urinary frequency, urgency, with a good urine stream, and mild constipation. Patient denies hematuria, diarrhea, or skin changes. I-PSS score of 10. Meaningful use questions answered and patient notified of telephone visit, 10:00am on 03/28/21 and expressed understanding.

## 2021-03-26 ENCOUNTER — Telehealth: Payer: Self-pay | Admitting: Gastroenterology

## 2021-03-26 NOTE — Telephone Encounter (Signed)
error 

## 2021-03-26 NOTE — Telephone Encounter (Signed)
Devon Barron,  I saw this patient on July 28 for question of possible GI source of iron deficiency anemia as well as his ongoing rectal pain since radioactive seed placement.  At that point, I felt it was unwise to perform colonoscopy due to the ongoing severe pain and proctitis.  He sent [place] a message last week requesting to get scheduled for colonoscopy, which appears to be after his recent office consult with you.  I read your note concurring with previous concerns that he may have had a contained rectal perforation in the prostate bed.  Do you feel he can safely undergo colonoscopy at this point?   - Devon Barron

## 2021-03-26 NOTE — Telephone Encounter (Signed)
I have sent a telephone message to Dr. Nadeen Landau, who saw this patient in consultation last week. I would like his opinion on whether he feels it is safe to proceed with colonoscopy at this point, given the recent symptoms and CT scan findings.  - HD

## 2021-03-27 ENCOUNTER — Telehealth: Payer: Self-pay | Admitting: Physician Assistant

## 2021-03-27 NOTE — Telephone Encounter (Signed)
I called Mr. Butta to follow up after his labs from last week. His hgb is stable and ferritin levels are elevated at 1,160. We do not recommend IV iron with the most recent labs.We can see patient back in 3 months to monitor Hgb levels.   Patient reports that although his rectal pain has improved, it is still quite uncomfortable and painful. He has to sit for long periods at work. He takes Norco 5-325 mg every 4-6 hours with some improvement. Patient is scheduled for a follow up with Dr. Johny Shears team tomorrow so I will relay his current symptoms to them.   Patient expressed understanding and satisfaction with the plan provided.

## 2021-03-27 NOTE — Progress Notes (Signed)
  Radiation Oncology         [phone] ________________________________  Name: Devon Barron MRN: [id number]  Date: 02/07/2021  DOB: 1960-10-26  End of Treatment Note  Diagnosis:   60 y.o. gentleman with Stage T1c adenocarcinoma of the prostate with Gleason score of 4+4, and PSA of 13.9.     Indication for treatment:  Curative, Definitive Radiotherapy       Radiation treatment dates:     01/03/21 - 02/07/21: IMRT prostate and pelvic nodes  12/07/20: Brachytherapy boost  Site/dose:  1. The prostate, seminal vesicles, and pelvic lymph nodes were treated to 45 Gy in 25 fractions of 1.8 Gy,to supplement an up-front prostate seed implant boost of 110 Gy to achieve a total nominal dose of 155 Gy.   2.   Insertion of radioactive I-125 seeds into the prostate gland; 110 Gy, boost therapy.   Beams/energy:  1. The prostate, seminal vesicles, and pelvic lymph nodes were initially treated using VMAT intensity modulated radiotherapy delivering 6 megavolt photons. Image guidance was performed with CB-CT studies prior to each fraction. He was immobilized with a body fix lower extremity mold.  2.  A total of 20 needles were used to deposit 55 seeds in the prostate gland. The individual seed activity was 0.276 mCi.  Narrative: The patient tolerated radiation treatment relatively well with only minor urinary irritation and modest fatigue.  The LUTS improved with Flomax daily. He did experience persistent rectal discomfort since the time of brachytherapy, improved with pain medication.  Plan: The patient has completed radiation treatment. He will return to radiation oncology clinic for routine followup in one month. I advised him to call or return sooner if he has any questions or concerns related to his recovery or treatment. ________________________________  Sheral Apley. Tammi Klippel, M.D.

## 2021-03-28 ENCOUNTER — Other Ambulatory Visit: Payer: Self-pay | Admitting: Urology

## 2021-03-28 ENCOUNTER — Ambulatory Visit
Admission: RE | Admit: 2021-03-28 | Discharge: 2021-03-28 | Disposition: A | Discharge status: Home / Self Care | Payer: BC Managed Care – PPO | Source: Ambulatory Visit | Attending: Urology | Admitting: Urology

## 2021-03-28 DIAGNOSIS — C61 Malignant neoplasm of prostate: Secondary | ICD-10-CM

## 2021-03-28 DIAGNOSIS — G893 Neoplasm related pain (acute) (chronic): Secondary | ICD-10-CM

## 2021-03-28 MED ORDER — HYDROCODONE-ACETAMINOPHEN 5-325 MG PO TABS: 1.0000 | ORAL_TABLET | Freq: Four times a day (QID) | ORAL | 0 refills | Status: DC | PRN

## 2021-03-28 NOTE — Progress Notes (Signed)
Radiation Oncology         [phone] ________________________________  Name: Devon Barron MRN: [id number]  Date: 03/28/2021  DOB: [birth year]  Post Treatment Note  CC: Janith Lima, MD  Ardis Hughs, MD  Diagnosis:   60 y.o. gentleman with Stage T1c adenocarcinoma of the prostate with Gleason score of 4+4, and PSA of 13.9.     Interval Since Last Radiation:  6.5 weeks    01/03/21 - 02/07/21: The prostate, seminal vesicles, and pelvic lymph nodes were treated to 45 Gy in 25 fractions of 1.8 Gy,to supplement an up-front prostate seed implant boost of 110 Gy to achieve a total nominal dose of 155 Gy.   12/07/20:  Insertion of radioactive I-125 seeds into the prostate gland; 110 Gy, boost therapy.  Narrative:  I spoke with the patient to conduct his routine scheduled 1 month follow up visit via telephone to spare the patient unnecessary potential exposure in the healthcare setting during the current COVID-19 pandemic.  The patient was notified in advance and gave permission to proceed with this visit format.  He tolerated radiation treatment relatively well with only minor urinary irritation and modest fatigue.  The LUTS improved with Flomax daily. He did experience persistent rectal discomfort since the time of brachytherapy, improved with pain medication.                              On review of systems, the patient states that he is making gradual progress regarding persistent pelvic pain since the time of his brachytherapy procedure with SpaceOAR gel placement.  He has continued taking Flomax daily and reports consistent progress regarding the LUTS.  At this point, his only complaint is the persistent rectal discomfort that occasionally radiates into the left hemiscrotum.  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  When he was working, sometimes he would be sitting in the truck  for 13 hours or more and in the evenings he would have severe rectal pressure/pain.  He denies any recent fever, chills or night sweats.  He has been taking Vicodin as needed for the pain and this does provide relief.  He had previously been using meloxicam but has not been taking this over the last couple of weeks because he was not sure if he was supposed to continue taking this or not.  He is doing periodic sitz bath's to help relieve the rectal discomfort.  He denies any abdominal pain, nausea, vomiting, diarrhea or hematochezia.  He does have periodic constipation but is managing this with MiraLAX as needed.  He reports occasional discomfort at the start of his urine stream, particularly in the middle of the night when his stream is weak and more difficult to start but once his stream starts the discomfort is gone.  He denies gross hematuria, straining to void, incomplete bladder emptying or incontinence.  He has been evaluated in the emergency department on multiple occasions for evaluation of the rectal discomfort and did have a CT A/P on 02/20/2021 which did not demonstrate any evidence of abscess or acute process but did demonstrate the SpaceOAR gel appear to be tunneling and towards the rectum in the extraperitoneal space between the rectum and the prostate gland with possible early ulceration.  This has been managed with antibiotics.  More recently he  had a scrotal ultrasound on 03/05/2021 which did not reveal any abnormality.  He has been seen by Dr. Annye English at [facility] surgery and has had multiple follow-up visits with his team at [facility] urology.  Overall, although frustrated with the persistent discomfort, he is pleased with his progress to date.  ALLERGIES:  has No Known Allergies.  Meds: Current Outpatient Medications  Medication Sig Dispense Refill   cholecalciferol (VITAMIN D) 1000 UNITS tablet Take 1,000 Units by mouth daily. VIT D 3 2000 UNITS TAKES 2 TABS = 4000 UNITS      Empagliflozin-metFORMIN HCl (SYNJARDY) 5-500 MG TABS Take 1 tablet by mouth 2 (two) times daily. 180 tablet 0   Ferric Maltol (ACCRUFER) 30 MG CAPS Take 1 capsule by mouth in the morning and at bedtime. 180 capsule 1   HYDROcodone-acetaminophen (NORCO) 5-325 MG tablet Take 1 tablet by mouth every 6 (six) hours as needed. 75 tablet 0   indapamide (LOZOL) 1.25 MG tablet TAKE 1 TABLET(1.25 MG) BY MOUTH DAILY (Patient taking differently: Take 1.25 mg by mouth daily.) 90 tablet 1   interferon beta-1a (REBIF) 44 MCG/0.5ML SOSY injection INJECT ONE SYRINGE SUBCUTANEOUSLY THREE TIMES PER WEEK. KEEP REFRIGERATED. (Patient taking differently: Inject 44 mcg into the skin 3 (three) times a week. Sunday, Tuesday, Thursday. KEEP REFRIGERATED.) 0.5 mL 5   meloxicam (MOBIC) 15 MG tablet Take 15 mg by mouth daily.     Multiple Vitamins-Minerals (MULTIVITAMIN WITH MINERALS) tablet Take 1 tablet by mouth daily.     tamsulosin (FLOMAX) 0.4 MG CAPS capsule Take 1 capsule (0.4 mg total) by mouth daily. (Patient taking differently: Take 0.4 mg by mouth at bedtime.) 7 capsule 0   rosuvastatin (CRESTOR) 10 MG tablet Take 1 tablet (10 mg total) by mouth daily. (Patient not taking: Reported on 03/23/2021) 90 tablet 1   No current facility-administered medications for this encounter.    Physical Findings:  vitals were not taken for this visit.  Pain Assessment Pain Score: 10-Worst pain ever Pain Loc: Rectum/10 Unable to assess due to telephone follow-up visit format.  Lab Findings: Lab Results  Component Value Date   WBC 5.3 03/20/2021   HGB 10.0 (L) 03/20/2021   HCT 31.5 (L) 03/20/2021   MCV 86.1 03/20/2021   PLT 246 03/20/2021     Radiographic Findings: US SCROTUM W/DOPPLER  Result Date: 03/05/2021 CLINICAL DATA:  Left testicular pain EXAM: SCROTAL ULTRASOUND DOPPLER ULTRASOUND OF THE TESTICLES TECHNIQUE: Complete ultrasound examination of the testicles, epididymis, and other scrotal structures was performed.  Color and spectral Doppler ultrasound were also utilized to evaluate blood flow to the testicles. COMPARISON:  None. FINDINGS: Right testicle Measurements: 3.0 x 1.4 x 2.4 cm. No mass or microlithiasis visualized. Left testicle Measurements: 2.9 x 2.1 x 2.6 cm. No mass or microlithiasis visualized. Right epididymis:  Normal in size and appearance. Left epididymis:  Normal in size and appearance. Hydrocele:  Small left hydrocele. Varicocele:  Mild left varicocele. Pulsed Doppler interrogation of both testes demonstrates normal low resistance arterial and venous waveforms bilaterally. IMPRESSION: No testicular abnormality.  No evidence of torsion. Small left hydrocele and mild left varicocele. Electronically Signed   By: Rolm Baptise M.D.   On: 03/05/2021 16:38    Impression/Plan: 1. 60 y.o. gentleman with Stage T1c adenocarcinoma of the prostate with Gleason score of 4+4, and PSA of 13.9.   He will continue to follow up with urology for ongoing PSA determinations and has an appointment scheduled with Dr. [name] in October  2022. He understands what to expect with regards to PSA monitoring going forward.  He is also scheduled to start some in office PT at [facility] urology on 04/02/2021 to help manage his pelvic pain.  I encouraged him to continue taking the meloxicam daily to help with inflammation and to use his pain medication for breakthrough pain.  I did provide a refill of the Vicodin since he indicated that he would run out of his current prescription by the weekend.  I will look forward to following his response to treatment via correspondence with urology, and would be happy to continue to participate in his care if clinically indicated. I talked to the patient about what to expect in the future, including his risk for erectile dysfunction and rectal bleeding. I encouraged him to call or return to the office if he has any questions regarding his previous radiation or possible radiation side effects. He was  comfortable with this plan and will follow up as needed.      Nicholos Johns, PA-C

## 2021-03-28 NOTE — Telephone Encounter (Signed)
(  There was a separate portal message from this patient earlier in the week, and then I generated this phone note to Dr. Dema Severin)  Dr. Orest Dikes recommendation in this chart note and a subsequent phone conversation I had with him where that we should wait about 8 weeks after the July CT scan before performing a colonoscopy.  Please make arrangements for Mr. Devon Barron to have a colonoscopy with me in the [facility] after September 19.  Preferably by the first week of October.  - HD

## 2021-03-30 NOTE — Telephone Encounter (Signed)
Spoke with patient to schedule his colonoscopy in the [place] with Dr. Loletha Carrow. Pt has been scheduled for a pre-visit on Wednesday, 04/25/21 at 1:30 PM. Colonoscopy is scheduled with Dr. Loletha Carrow in the [facility] on Tuesday, 05/08/21 at 2 PM, patient is aware that he will need to arrive at 1 PM with a care partner. Pt verbalized understanding and had no concerns at the end of the call.

## 2021-04-02 ENCOUNTER — Telehealth: Payer: Self-pay | Admitting: Physician Assistant

## 2021-04-02 NOTE — Telephone Encounter (Signed)
R/s 9/6 appt per provder request. Called and spoke with patient. Confirmed new date and time

## 2021-04-10 ENCOUNTER — Ambulatory Visit: Payer: BC Managed Care – PPO | Admitting: Physician Assistant

## 2021-04-10 ENCOUNTER — Other Ambulatory Visit: Payer: BC Managed Care – PPO

## 2021-04-25 ENCOUNTER — Ambulatory Visit (AMBULATORY_SURGERY_CENTER): Payer: BC Managed Care – PPO | Admitting: *Deleted

## 2021-04-25 ENCOUNTER — Other Ambulatory Visit: Payer: Self-pay

## 2021-04-25 VITALS — Ht 69.0 in | Wt 182.0 lb

## 2021-04-25 DIAGNOSIS — D500 Iron deficiency anemia secondary to blood loss (chronic): Secondary | ICD-10-CM

## 2021-04-25 MED ORDER — PLENVU 140 G PO SOLR: 1.0000 | Freq: Once | ORAL | 0 refills | Status: AC

## 2021-04-25 NOTE — Progress Notes (Signed)
Virtual pre-visit was done in person.   No egg or soy allergy known to patient  No issues known to pt with past sedation with any surgeries or procedures Patient denies ever being told they had issues or difficulty with intubation  No FH of Malignant Hyperthermia Pt is not on diet pills Pt is not on  home 02  Pt is not on blood thinners  Pt denies issues with constipation  No A fib or A flutter  EMMI video to pt or via [place] 19 guidelines implemented in PV today with Pt and RN   Pt is fully vaccinated  for Covid   Coupon given to pt in PV today , Code to Pharmacy and  NO PA's for preps discussed with pt In PV today  Discussed with pt there will be an out-of-pocket cost for prep and that varies from $0 to 70 +  dollars   Due to the COVID-19 pandemic we are asking patients to follow certain guidelines.  Pt aware of COVID protocols and [facility] guidelines

## 2021-05-08 ENCOUNTER — Encounter: Payer: Self-pay | Admitting: Gastroenterology

## 2021-05-08 ENCOUNTER — Ambulatory Visit (AMBULATORY_SURGERY_CENTER): Payer: BC Managed Care – PPO | Admitting: Gastroenterology

## 2021-05-08 ENCOUNTER — Other Ambulatory Visit: Payer: Self-pay

## 2021-05-08 VITALS — BP 115/84 | HR 66 | Temp 96.8°F | Resp 12 | Ht 69.0 in | Wt 182.0 lb

## 2021-05-08 DIAGNOSIS — D509 Iron deficiency anemia, unspecified: Secondary | ICD-10-CM

## 2021-05-08 DIAGNOSIS — K633 Ulcer of intestine: Secondary | ICD-10-CM | POA: Diagnosis not present

## 2021-05-08 MED ORDER — SODIUM CHLORIDE 0.9 % IV SOLN: 500.0000 mL | Freq: Once | INTRAVENOUS | Status: DC

## 2021-05-08 NOTE — Progress Notes (Signed)
History and Physical:  This patient presents for endoscopic testing for: Encounter Diagnosis  Name Primary?   Iron deficiency anemia, unspecified iron deficiency anemia type Yes    Patient denies chest pain or dyspnea. Prostate pain IDA - also prob anemia chronic disease. See 03/01/21 office note.  Subsequently seen by CRS and rad onc Has space oar prostate rad seed in place    Past Medical History: Past Medical History:  Diagnosis Date   DM type 2 (diabetes mellitus, type 2) ([place])    Hyperlipidemia    MS (multiple sclerosis) ([place])    Prostate cancer ([place])      Past Surgical History: Past Surgical History:  Procedure Laterality Date   CYSTOSCOPY  12/07/2020   Procedure: CYSTOSCOPY FLEXIBLE;  Surgeon: Ardis Hughs, MD;  Location: [facility];  Service: Urology;;   DEVIATED SETUM SURGERY  25 YRS AGO   HERNIA REPAIR  03/2013   INGUINAL   PROSTATE BIOPSY     RADIOACTIVE SEED IMPLANT N/A 12/07/2020   Procedure: RADIOACTIVE SEED IMPLANT/BRACHYTHERAPY IMPLANT;  Surgeon: Ardis Hughs, MD;  Location: [facility];  Service: Urology;  Laterality: N/A;   SPACE OAR INSTILLATION N/A 12/07/2020   Procedure: SPACE OAR INSTILLATION;  Surgeon: Ardis Hughs, MD;  Location: [facility];  Service: Urology;  Laterality: N/A;    Allergies: No Known Allergies  Outpatient Meds: Current Outpatient Medications  Medication Sig Dispense Refill   cholecalciferol (VITAMIN D) 1000 UNITS tablet Take 1,000 Units by mouth daily. VIT D 3 2000 UNITS TAKES 2 TABS = 4000 UNITS     Ferric Maltol (ACCRUFER) 30 MG CAPS Take 1 capsule by mouth in the morning and at bedtime. 180 capsule 1   interferon beta-1a (REBIF) 44 MCG/0.5ML SOSY injection INJECT ONE SYRINGE SUBCUTANEOUSLY THREE TIMES PER WEEK. KEEP REFRIGERATED. (Patient taking differently: Inject 44 mcg into the skin 3 (three) times a week. Sunday, Tuesday, Thursday. KEEP REFRIGERATED.) 0.5 mL 5    Multiple Vitamins-Minerals (MULTIVITAMIN WITH MINERALS) tablet Take 1 tablet by mouth daily.     tamsulosin (FLOMAX) 0.4 MG CAPS capsule Take 1 capsule (0.4 mg total) by mouth daily. (Patient taking differently: Take 0.4 mg by mouth at bedtime.) 7 capsule 0   Empagliflozin-metFORMIN HCl (SYNJARDY) 5-500 MG TABS Take 1 tablet by mouth 2 (two) times daily. (Patient not taking: No sig reported) 180 tablet 0   HYDROcodone-acetaminophen (NORCO) 5-325 MG tablet Take 1 tablet by mouth every 6 (six) hours as needed. (Patient not taking: No sig reported) 75 tablet 0   indapamide (LOZOL) 1.25 MG tablet TAKE 1 TABLET(1.25 MG) BY MOUTH DAILY (Patient not taking: Reported on 05/08/2021) 90 tablet 1   meloxicam (MOBIC) 15 MG tablet Take 15 mg by mouth daily. (Patient not taking: Reported on 05/08/2021)     rosuvastatin (CRESTOR) 10 MG tablet Take 1 tablet (10 mg total) by mouth daily. (Patient not taking: No sig reported) 90 tablet 1   Current Facility-Administered Medications  Medication Dose Route Frequency Provider Last Rate Last Admin   0.9 %  sodium chloride infusion  500 mL Intravenous Once Doran Stabler, MD          ___________________________________________________________________ Objective   Exam:  BP 122/80   Pulse 93   Temp (!) 96.8 F (36 C) (Temporal)   Ht 5\' 9"  (1.753 m)   Wt 182 lb (82.6 kg)   SpO2 99%   BMI 26.88 kg/m   CV: RRR without murmur,  S1/S2 Resp: clear to auscultation bilaterally, normal RR and effort noted GI: soft, no tenderness, with active bowel sounds.   Assessment: Encounter Diagnosis  Name Primary?   Iron deficiency anemia, unspecified iron deficiency anemia type Yes     Plan: Colonoscopy  The benefits and risks of the planned procedure were described in detail with the patient or (when appropriate) their health care proxy.  Risks were outlined as including, but not limited to, bleeding, infection, perforation, adverse medication reaction leading  to cardiac or pulmonary decompensation, pancreatitis (if ERCP).  The limitation of incomplete mucosal visualization was also discussed.  No guarantees or warranties were given.    The patient is appropriate for an endoscopic procedure in the ambulatory setting.   - Wilfrid Lund, MD

## 2021-05-08 NOTE — Progress Notes (Signed)
VS  DT ? ?Pt's states no medical or surgical changes since previsit or office visit. ? ?

## 2021-05-08 NOTE — Progress Notes (Signed)
PT taken to PACU. Monitors in place. VSS. Report given to RN. 

## 2021-05-08 NOTE — Patient Instructions (Signed)
Read all of the handouts given to you by your recovery room nurse.  YOU HAD AN ENDOSCOPIC PROCEDURE TODAY AT [facility]:   Refer to the procedure report that was given to you for any specific questions about what was found during the examination.  If the procedure report does not answer your questions, please call your gastroenterologist to clarify.  If you requested that your care partner not be given the details of your procedure findings, then the procedure report has been included in a sealed envelope for you to review at your convenience later.  YOU SHOULD EXPECT: Some feelings of bloating in the abdomen. Passage of more gas than usual.  Walking can help get rid of the air that was put into your GI tract during the procedure and reduce the bloating. If you had a lower endoscopy (such as a colonoscopy or flexible sigmoidoscopy) you may notice spotting of blood in your stool or on the toilet paper. If you underwent a bowel prep for your procedure, you may not have a normal bowel movement for a few days.  Please Note:  You might notice some irritation and congestion in your nose or some drainage.  This is from the oxygen used during your procedure.  There is no need for concern and it should clear up in a day or so.  SYMPTOMS TO REPORT IMMEDIATELY:  Following lower endoscopy (colonoscopy or flexible sigmoidoscopy):  Excessive amounts of blood in the stool  Significant tenderness or worsening of abdominal pains  Swelling of the abdomen that is new, acute  Fever of 100F or higher   For urgent or emergent issues, a gastroenterologist can be reached at any hour by calling [phone]. Do not use MyChart messaging for urgent concerns.    DIET:  We do recommend a small meal at first, but then you may proceed to your regular diet.  Drink plenty of fluids but you should avoid alcoholic beverages for 24 hours.  ACTIVITY:  You should plan to take it easy for the rest of today and  you should NOT DRIVE or use heavy machinery until tomorrow (because of the sedation medicines used during the test).    FOLLOW UP: Our staff will call the number listed on your records 48-72 hours following your procedure to check on you and address any questions or concerns that you may have regarding the information given to you following your procedure. If we do not reach you, we will leave a message.  We will attempt to reach you two times.  During this call, we will ask if you have developed any symptoms of COVID 19. If you develop any symptoms (ie: fever, flu-like symptoms, shortness of breath, cough etc.) before then, please call [phone].  If you test positive for Covid 19 in the 2 weeks post procedure, please call and report this information to [place].      SIGNATURES/CONFIDENTIALITY: You and/or your care partner have signed paperwork which will be entered into your electronic medical record.  These signatures attest to the fact that that the information above on your After Visit Summary has been reviewed and is understood.  Full responsibility of the confidentiality of this discharge information lies with you and/or your care-partner.

## 2021-05-08 NOTE — Op Note (Signed)
[place] Patient Name: Devon Barron Procedure Date: 05/08/2021 2:33 PM MRN: [id number] Endoscopist: Mallie Mussel L. Loletha Carrow , MD Age: 60 Referring MD:  Date of Birth: 04-02-1961 Gender: Male Account #: [id number] Procedure:                Colonoscopy Indications:              Iron deficiency anemia                           prostate cancer with SpaceOar and radioactive seed                            placement months ago with subsequent pain and CT                            scan reporting rectal perforation Medicines:                Monitored Anesthesia Care Procedure:                Pre-Anesthesia Assessment:                           - Prior to the procedure, a History and Physical                            was performed, and patient medications and                            allergies were reviewed. The patient's tolerance of                            previous anesthesia was also reviewed. The risks                            and benefits of the procedure and the sedation                            options and risks were discussed with the patient.                            All questions were answered, and informed consent                            was obtained. Prior Anticoagulants: The patient has                            taken no previous anticoagulant or antiplatelet                            agents. ASA Grade Assessment: II - A patient with                            mild systemic disease. After reviewing the risks  and benefits, the patient was deemed in                            satisfactory condition to undergo the procedure.                           After obtaining informed consent, the colonoscope                            was passed under direct vision. Throughout the                            procedure, the patient's blood pressure, pulse, and                            oxygen saturations were monitored continuously. The                             CF [id number] #[id number] was introduced through the anus                            and advanced to the the cecum, identified by                            appendiceal orifice and ileocecal valve. The                            colonoscopy was performed without difficulty. The                            patient tolerated the procedure well. The quality                            of the bowel preparation was excellent. The                            ileocecal valve, appendiceal orifice, and rectum                            were photographed. The bowel preparation used was                            Plenvu. Scope In: 2:42:51 PM Scope Out: 2:54:13 PM Scope Withdrawal Time: 0 hours 10 minutes 1 second  Total Procedure Duration: 0 hours 11 minutes 22 seconds  Findings:                 The digital rectal exam findings include palpable                            ulcer with firm tissue anteriorly.                           A single (solitary) eight to ten mm ulcer was found  in the distal rectum with benign-appearing adjacent                            edmatous tissue. The distal rectal mucosa is                            edematous. No material or device was protruding                            into the rectal lumen.                           Retroflexion in the rectum was not performed due to                            findings. Complications:            No immediate complications. Estimated Blood Loss:     Estimated blood loss: none. Impression:               - Palpable ulcer with firm tissue anteriorly found                            on digital rectal exam.                           - A single (solitary) ulcer in the distal rectum.                            Occurred from patient's prostate device placement.                           - No specimens collected.                           Patient's iron studies look like anemia chronic                             disease from prostate cancer and it's treatment. Recommendation:           - Patient has a contact number available for                            emergencies. The signs and symptoms of potential                            delayed complications were discussed with the                            patient. Return to normal activities tomorrow.                            Written discharge instructions were provided to the                            patient.                           -  Resume previous diet.                           - Continue present medications.                           - Repeat colonoscopy in 10 years for screening                            purposes.                           - Follow up as scheduled with radiation oncology                            and (if needed) colorectal surgery. Maze Corniel L. Loletha Carrow, MD 05/08/2021 3:06:06 PM This report has been signed electronically. CC Letter to:             Sharon Mt. White MD, MD, Sheral Apley. Tammi Klippel

## 2021-05-10 ENCOUNTER — Telehealth: Payer: Self-pay | Admitting: *Deleted

## 2021-05-10 NOTE — Telephone Encounter (Signed)
  Follow up Call-  Call back number 05/08/2021  Post procedure Call Back phone  # [phone]  Permission to leave phone message Yes  Some recent data might be hidden     Patient questions:  Do you have a fever, pain , or abdominal swelling? No. Pain Score  0 *  Have you tolerated food without any problems? Yes.    Have you been able to return to your normal activities? Yes.    Do you have any questions about your discharge instructions: Diet   No. Medications  No. Follow up visit  No.  Do you have questions or concerns about your Care? No.  Actions: * If pain score is 4 or above: No action needed, pain <4.

## 2021-05-25 ENCOUNTER — Encounter: Payer: Self-pay | Admitting: Internal Medicine

## 2021-06-18 ENCOUNTER — Other Ambulatory Visit: Payer: Self-pay | Admitting: Physician Assistant

## 2021-06-18 DIAGNOSIS — D649 Anemia, unspecified: Secondary | ICD-10-CM

## 2021-06-19 ENCOUNTER — Inpatient Hospital Stay: Payer: BC Managed Care – PPO | Attending: Physician Assistant

## 2021-06-19 ENCOUNTER — Inpatient Hospital Stay (HOSPITAL_BASED_OUTPATIENT_CLINIC_OR_DEPARTMENT_OTHER): Payer: BC Managed Care – PPO | Admitting: Physician Assistant

## 2021-06-19 ENCOUNTER — Inpatient Hospital Stay: Payer: BC Managed Care – PPO

## 2021-06-19 ENCOUNTER — Other Ambulatory Visit: Payer: Self-pay

## 2021-06-19 ENCOUNTER — Inpatient Hospital Stay: Payer: BC Managed Care – PPO | Admitting: Physician Assistant

## 2021-06-19 VITALS — BP 117/81 | HR 90 | Temp 97.2°F | Resp 20 | Wt 188.1 lb

## 2021-06-19 DIAGNOSIS — Z833 Family history of diabetes mellitus: Secondary | ICD-10-CM | POA: Insufficient documentation

## 2021-06-19 DIAGNOSIS — Z79899 Other long term (current) drug therapy: Secondary | ICD-10-CM | POA: Diagnosis not present

## 2021-06-19 DIAGNOSIS — Z8249 Family history of ischemic heart disease and other diseases of the circulatory system: Secondary | ICD-10-CM | POA: Insufficient documentation

## 2021-06-19 DIAGNOSIS — E119 Type 2 diabetes mellitus without complications: Secondary | ICD-10-CM | POA: Insufficient documentation

## 2021-06-19 DIAGNOSIS — Z923 Personal history of irradiation: Secondary | ICD-10-CM | POA: Diagnosis not present

## 2021-06-19 DIAGNOSIS — C61 Malignant neoplasm of prostate: Secondary | ICD-10-CM | POA: Diagnosis present

## 2021-06-19 DIAGNOSIS — K6289 Other specified diseases of anus and rectum: Secondary | ICD-10-CM | POA: Insufficient documentation

## 2021-06-19 DIAGNOSIS — E785 Hyperlipidemia, unspecified: Secondary | ICD-10-CM | POA: Insufficient documentation

## 2021-06-19 DIAGNOSIS — D649 Anemia, unspecified: Secondary | ICD-10-CM

## 2021-06-19 DIAGNOSIS — Z8041 Family history of malignant neoplasm of ovary: Secondary | ICD-10-CM | POA: Diagnosis not present

## 2021-06-19 LAB — IRON AND TIBC
Iron: 103 ug/dL (ref 42–163)
Saturation Ratios: 37 % (ref 20–55)
TIBC: 276 ug/dL (ref 202–409)
UIBC: 173 ug/dL (ref 117–376)

## 2021-06-19 LAB — RETIC PANEL
Immature Retic Fract: 9.5 % (ref 2.3–15.9)
RBC.: 4.44 MIL/uL (ref 4.22–5.81)
Retic Count, Absolute: 32.9 10*3/uL (ref 19.0–186.0)
Retic Ct Pct: 0.7 % (ref 0.4–3.1)
Reticulocyte Hemoglobin: 32.4 pg (ref >27.9)

## 2021-06-19 LAB — CBC WITH DIFFERENTIAL (CANCER CENTER ONLY)
Abs Immature Granulocytes: 0.01 10*3/uL (ref 0.00–0.07)
Basophils Absolute: 0 10*3/uL (ref 0.0–0.1)
Basophils Relative: 0 %
Eosinophils Absolute: 0.1 10*3/uL (ref 0.0–0.5)
Eosinophils Relative: 2 %
HCT: 38.9 % — ABNORMAL LOW (ref 39.0–52.0)
Hemoglobin: 12.5 g/dL — ABNORMAL LOW (ref 13.0–17.0)
Immature Granulocytes: 0 %
Lymphocytes Relative: 11 %
Lymphs Abs: 0.6 10*3/uL — ABNORMAL LOW (ref 0.7–4.0)
MCH: 28 pg (ref 26.0–34.0)
MCHC: 32.1 g/dL (ref 30.0–36.0)
MCV: 87 fL (ref 80.0–100.0)
Monocytes Absolute: 0.6 10*3/uL (ref 0.1–1.0)
Monocytes Relative: 12 %
Neutro Abs: 3.6 10*3/uL (ref 1.7–7.7)
Neutrophils Relative %: 75 %
Platelet Count: 206 10*3/uL (ref 150–400)
RBC: 4.47 MIL/uL (ref 4.22–5.81)
RDW: 13.6 % (ref 11.5–15.5)
WBC Count: 4.9 10*3/uL (ref 4.0–10.5)
nRBC: 0 % (ref 0.0–0.2)

## 2021-06-19 LAB — CMP (CANCER CENTER ONLY)
ALT: 19 U/L (ref 0–44)
AST: 20 U/L (ref 15–41)
Albumin: 4.2 g/dL (ref 3.5–5.0)
Alkaline Phosphatase: 74 U/L (ref 38–126)
Anion gap: 10 (ref 5–15)
BUN: 10 mg/dL (ref 6–20)
CO2: 27 mmol/L (ref 22–32)
Calcium: 9.7 mg/dL (ref 8.9–10.3)
Chloride: 102 mmol/L (ref 98–111)
Creatinine: 0.9 mg/dL (ref 0.61–1.24)
GFR, Estimated: >60 mL/min (ref >60)
Glucose, Bld: 98 mg/dL (ref 70–99)
Potassium: 3.8 mmol/L (ref 3.5–5.1)
Sodium: 139 mmol/L (ref 135–145)
Total Bilirubin: 0.3 mg/dL (ref 0.3–1.2)
Total Protein: 8.1 g/dL (ref 6.5–8.1)

## 2021-06-19 LAB — FERRITIN: Ferritin: 818 ng/mL — ABNORMAL HIGH (ref 24–336)

## 2021-06-21 NOTE — Progress Notes (Signed)
Devon Barron [phone]   [phone]  SURVIVORSHIP VISIT  Patient Care Team: Janith Lima, MD as PCP - General (Internal Medicine) Pieter Partridge, DO as Consulting Physician (Neurology) Cira Rue, RN as Oncology Nurse Navigator  Oncology History  Malignant neoplasm of prostate [facility])  05/09/2020 Cancer Staging   Staging form: Prostate, AJCC 8th Edition - Clinical stage from 05/09/2020: Stage IIC (cT1c, cN0, cM0, PSA: 13.9, Grade Group: 4) - Signed by Freeman Caldron, PA-C on 10/03/2020 Histopathologic type: Adenocarcinoma, NOS Stage prefix: Initial diagnosis Prostate specific antigen (PSA) range: 10 to 19 Gleason primary pattern: 4 Gleason secondary pattern: 4 Gleason score: 8 Histologic grading system: 5 grade system Number of biopsy cores examined: 12 Number of biopsy cores positive: 6 Location of positive needle core biopsies: Both sides    10/03/2020 Initial Diagnosis   Malignant neoplasm of prostate ([place])      TREATMENT HISTORY:  1) 12/07/20:  Insertion of radioactive I-125 seeds into the prostate gland; 110 Gy, boost therapy.  2) 01/03/21 - 02/07/21: The prostate, seminal vesicles, and pelvic lymph nodes were treated to 45 Gy in 25 fractions of 1.8 Gy,to supplement an up-front prostate seed implant boost of 110 Gy to achieve a total nominal dose of 155 Gy.  3) Eligard q 6 months-most recent injection on 03/15/2021.   HISTORY OF PRESENTING ILLNESS:  [name] 60 y.o. male presents today to review survivorship care plan detailing his treatment course for prostate cancer, monitoring long-term side effects of the treatment, education regarding health maintenance, cancer screening and overall wellness and health promotion. He is unaccompanied for this visit.   On exam today, he reports that his energy levels are fairly stable over the last few weeks. He is back to work and able to complete his daily routines on his own. His appetite has  improved with noted weight gain. He denies nausea, vomiting or abdominal pain. He reports that rectal pain has markedly improved over the last couple of months. He no longer requires any pain medication. He is moving his bowel regularly without diarrhea or constipation. He denies any fevers, chills, night sweats, shortness of breath, chest pain or cough. He has no other complaints. Rest of the 10 point ROS is below.   MEDICAL HISTORY:  Past Medical History:  Diagnosis Date   DM type 2 (diabetes mellitus, type 2) ([place])    Hyperlipidemia    MS (multiple sclerosis) ([place])    Prostate cancer ([place])     SURGICAL HISTORY: Past Surgical History:  Procedure Laterality Date   CYSTOSCOPY  12/07/2020   Procedure: CYSTOSCOPY FLEXIBLE;  Surgeon: Ardis Hughs, MD;  Location: [facility];  Service: Urology;;   DEVIATED SETUM SURGERY  25 YRS AGO   HERNIA REPAIR  03/2013   INGUINAL   PROSTATE BIOPSY     RADIOACTIVE SEED IMPLANT N/A 12/07/2020   Procedure: RADIOACTIVE SEED IMPLANT/BRACHYTHERAPY IMPLANT;  Surgeon: Ardis Hughs, MD;  Location: [facility];  Service: Urology;  Laterality: N/A;   SPACE OAR INSTILLATION N/A 12/07/2020   Procedure: SPACE OAR INSTILLATION;  Surgeon: Ardis Hughs, MD;  Location: [facility];  Service: Urology;  Laterality: N/A;    SOCIAL HISTORY: Social History   Socioeconomic History   Marital status: Single    Spouse name: Not on file   Number of children: 1   Years of education: Not on file   Highest education level: Some college, no degree  Occupational History   Occupation: DRIVER    Employer: [employer]  Tobacco Use   Smoking status: Never   Smokeless tobacco: Never  Vaping Use   Vaping Use: Never used  Substance and Sexual Activity   Alcohol use: Yes    Alcohol/week: 5.0 standard drinks    Types: 5 Cans of beer per week    Comment: occasional   Drug use: No   Sexual activity: Yes    Partners: Female   Other Topics Concern   Not on file  Social History Narrative   Patient is right-handed. His grown son lives with him in a 2 level home. He uses a home exercise machine 2 x weekly.   Social Determinants of Health   Financial Resource Strain: Not on file  Food Insecurity: No Food Insecurity   Worried About Charity fundraiser in the Last Year: Never true   Ran Out of Food in the Last Year: Never true  Transportation Needs: No Transportation Needs   Lack of Transportation (Medical): No   Lack of Transportation (Non-Medical): No  Physical Activity: Not on file  Stress: Not on file  Social Connections: Not on file  Intimate Partner Violence: Not on file    FAMILY HISTORY: Family History  Problem Relation Age of Onset   Hypertension Mother    Ovarian cancer Mother    Hypertension Father    Prostate cancer Father    Diabetes Sister    Diabetes Brother    Ataxia Neg Hx    Chorea Neg Hx    Dementia Neg Hx    Mental retardation Neg Hx    Migraines Neg Hx    Multiple sclerosis Neg Hx    Neurofibromatosis Neg Hx    Neuropathy Neg Hx    Parkinsonism Neg Hx    Seizures Neg Hx    Stroke Neg Hx    Early death Neg Hx    Cancer Neg Hx    Alcohol abuse Neg Hx    Hearing loss Neg Hx    Heart disease Neg Hx    Hyperlipidemia Neg Hx    Kidney disease Neg Hx    Breast cancer Neg Hx    Colon cancer Neg Hx    Pancreatic cancer Neg Hx    Rectal cancer Neg Hx    Stomach cancer Neg Hx    Esophageal cancer Neg Hx     ALLERGIES:  has No Known Allergies.  MEDICATIONS:  Current Outpatient Medications  Medication Sig Dispense Refill   cholecalciferol (VITAMIN D) 1000 UNITS tablet Take 1,000 Units by mouth daily. VIT D 3 2000 UNITS TAKES 2 TABS = 4000 UNITS     Ferric Maltol (ACCRUFER) 30 MG CAPS Take 1 capsule by mouth in the morning and at bedtime. 180 capsule 1   HYDROcodone-acetaminophen (NORCO) 5-325 MG tablet Take 1 tablet by mouth every 6 (six) hours as needed. 75 tablet 0    indapamide (LOZOL) 1.25 MG tablet TAKE 1 TABLET(1.25 MG) BY MOUTH DAILY 90 tablet 1   interferon beta-1a (REBIF) 44 MCG/0.5ML SOSY injection INJECT ONE SYRINGE SUBCUTANEOUSLY THREE TIMES PER WEEK. KEEP REFRIGERATED. (Patient taking differently: Inject 44 mcg into the skin 3 (three) times a week. Sunday, Tuesday, Thursday. KEEP REFRIGERATED.) 0.5 mL 5   Multiple Vitamins-Minerals (MULTIVITAMIN WITH MINERALS) tablet Take 1 tablet by mouth daily.     rosuvastatin (CRESTOR) 10 MG tablet Take 1 tablet (10 mg total) by mouth daily. 90 tablet 1   tamsulosin (FLOMAX) 0.4  MG CAPS capsule Take 1 capsule (0.4 mg total) by mouth daily. (Patient taking differently: Take 0.4 mg by mouth at bedtime.) 7 capsule 0   Empagliflozin-metFORMIN HCl (SYNJARDY) 5-500 MG TABS Take 1 tablet by mouth 2 (two) times daily. (Patient not taking: No sig reported) 180 tablet 0   meloxicam (MOBIC) 15 MG tablet Take 15 mg by mouth daily. (Patient not taking: No sig reported)     No current facility-administered medications for this visit.   REVIEW OF SYSTEMS:   Constitutional: ( - ) fevers, ( - )  chills , ( - ) night sweats Eyes: ( - ) blurriness of vision, ( - ) double vision, ( - ) watery eyes Ears, nose, mouth, throat, and face: ( - ) mucositis, ( - ) sore throat Respiratory: ( - ) cough, ( - ) dyspnea, ( - ) wheezes Cardiovascular: ( - ) palpitation, ( - ) chest discomfort, ( - ) lower extremity swelling Gastrointestinal:  ( - ) nausea, ( - ) heartburn, ( - ) change in bowel habits Skin: ( - ) abnormal skin rashes Lymphatics: ( - ) new lymphadenopathy, ( - ) easy bruising Neurological: ( - ) numbness, ( - ) tingling, ( - ) new weaknesses Behavioral/Psych: ( - ) mood change, ( - ) new changes  All other systems were reviewed with the patient and are negative.  PHYSICAL EXAMINATION: ECOG PERFORMANCE STATUS: 1 - Symptomatic but completely ambulatory  Vitals:   06/19/21 1208  BP: 117/81  Pulse: 90  Resp: 20  Temp: (!)  97.2 F (36.2 C)  SpO2: 100%   Filed Weights   06/19/21 1208  Weight: 188 lb 1.6 oz (85.3 kg)    GENERAL: well appearing male in NAD  SKIN: skin color, texture, turgor are normal, no rashes or significant lesions EYES: conjunctiva are pink and non-injected, sclera clear OROPHARYNX: no exudate, no erythema; lips, buccal mucosa, and tongue normal  NECK: supple, non-tender LYMPH:  no palpable lymphadenopathy in the cervical or supraclavicular lymph nodes.  LUNGS: clear to auscultation and percussion with normal breathing effort HEART: regular rate & rhythm and no murmurs and no lower extremity edema ABDOMEN: soft, non-tender, non-distended, normal bowel sounds Musculoskeletal: no cyanosis of digits and no clubbing  PSYCH: alert & oriented x 3, fluent speech NEURO: no focal motor/sensory deficits  LABORATORY DATA:  I have reviewed the data as listed CBC Latest Ref Rng & Units 06/19/2021 03/20/2021 03/05/2021  WBC 4.0 - 10.5 K/uL 4.9 5.3 5.2  Hemoglobin 13.0 - 17.0 g/dL 12.5(L) 10.0(L) 10.6(L)  Hematocrit 39.0 - 52.0 % 38.9(L) 31.5(L) 33.7(L)  Platelets 150 - 400 K/uL 206 246 328    CMP Latest Ref Rng & Units 06/19/2021 03/05/2021 02/21/2021  Glucose 70 - 99 mg/dL 98 101(H) 140(H)  BUN 6 - 20 mg/dL 10 13 8   Creatinine 0.61 - 1.24 mg/dL 0.90 0.87 0.91  Sodium 135 - 145 mmol/L 139 137 137  Potassium 3.5 - 5.1 mmol/L 3.8 4.0 3.8  Chloride 98 - 111 mmol/L 102 99 105  CO2 22 - 32 mmol/L 27 29 26   Calcium 8.9 - 10.3 mg/dL 9.7 9.8 9.7  Total Protein 6.5 - 8.1 g/dL 8.1 - 8.0  Total Bilirubin 0.3 - 1.2 mg/dL 0.3 - 0.4  Alkaline Phos 38 - 126 U/L 74 - 50  AST 15 - 41 U/L 20 - 18  ALT 0 - 44 U/L 19 - 19   ASSESSMENT & PLAN ALEXANDRE FARIES is a 60  y.o. male with a diagnosis of  Stage T1c adenocarcinoma of the prostate with Gleason score of 4+4, and PSA of 13.9. He presents to the survivorship clinic to review the survivorship care plan.  Stage T1c adenocarcinoma of the prostate: Mr. Lizotte  complete treatment which includes radioactive seed placement on 12/07/2020 following by radiation to the prostate, seminal vescicles and pelvic lymph nodes from 01/03/2021-02/07/2021. He is scheduled for a follow up with Dr. [name] in Urology around April 2023. Today, comprehensive survivorship care plan and treatment summary was reviewed with the patient detailing his cancer diagnosis, treatment course, potential late/long-term effects of treatment, appropriate follow-up care with recommendations for the future and patient education resources.  A copy of the summary, along with a letter will be sent to the patient's primary care provider via mail/fax, in basket message after today's visit.  Bone Health: He was given education on specific activities to promote bone health.  Cancer Screenings: Due to Mr. Newhall's age, he should receiving screening for skin and colon cancer. He underwent a colonoscopy on 05/08/2021 with no evidence of malignancy. Recommend next colonoscopy in 10 years for screening purposes. Patient will follow-up with his PCP to complete recommended skin cancer screenings.  Health maintenance and wellness promotion: Mr. Helget was encouraged to consume 5-7 servings of fruits and vegetables per day. He was also encouraged to engage in moderate to vigorous exercise for 30 minutes per day most days of the week. We discussed the [employer] fitness program, which is designed for cancer survivors to help them become more physically fit after cancer treatments.  He was instructed to limit her alcohol consumption and continue to abstain from tobacco use.    Support services/counseling: It is not uncommon for this period of the patient's cancer care trajectory to be one of many emotions and stressors.  He was given information regarding our available services and encouraged to contact me with any questions or for help enrolling in any of our support group/programs.  FOLLOW UP: -Return to cancer  center PRN -Follow up with urology on 11/2021 with repeat PSA  No orders of the defined types were placed in this encounter.   All questions were answered. The patient knows to call the clinic with any problems, questions or concerns.  I have spent a total of 40 minutes minutes of face-to-face and non-face-to-face time, preparing to see the patient, obtaining and/or reviewing separately obtained history, performing a medically appropriate examination, counseling and educating the patient, documenting clinical information in the electronic health record, and care coordination.   Dede Query, [place] [facility] Phone: [phone]

## 2021-06-27 LAB — HM DIABETES EYE EXAM

## 2021-08-06 ENCOUNTER — Other Ambulatory Visit: Payer: Self-pay | Admitting: Neurology

## 2021-08-10 ENCOUNTER — Other Ambulatory Visit: Payer: Self-pay

## 2021-08-10 MED ORDER — REBIF 44 MCG/0.5ML ~~LOC~~ SOSY: PREFILLED_SYRINGE | SUBCUTANEOUS | 0 refills | Status: DC

## 2021-08-13 ENCOUNTER — Other Ambulatory Visit: Payer: Self-pay | Admitting: Neurology

## 2021-08-17 ENCOUNTER — Other Ambulatory Visit: Payer: Self-pay

## 2021-08-17 MED ORDER — REBIF 44 MCG/0.5ML ~~LOC~~ SOSY: PREFILLED_SYRINGE | SUBCUTANEOUS | 0 refills | Status: DC

## 2021-08-23 ENCOUNTER — Telehealth: Payer: Self-pay | Admitting: Neurology

## 2021-08-23 NOTE — Telephone Encounter (Signed)
[facility] stated that the clarification had been sent in they no longer needed any information,

## 2021-08-28 NOTE — Progress Notes (Signed)
NEUROLOGY FOLLOW UP OFFICE NOTE  TJAY VELAZQUEZ [id number]  Assessment/Plan:   Multiple sclerosis Hypertension   DMT:  Rebif D3 4000 IU daily - check level today CBC with diff, CMP and vit D today and again in 6 months MRI of brain and cervical spine with and without contrast in 6 months Follow up in 6 months (after repeat tests) Follow up with PCP regarding elevated blood pressure   Subjective:  Devon Barron is a 61 year old right-handed man who follows up for multiple sclerosis   UPDATE: Current disease modifying therapy: Rebif Other current medications: D3 4000 IU daily   Lab from 06/19/2021:  CBC with WBC 4.9, HGB 12.5, HCT 38.9, MCV 87, PLT 206, ALC 0.6; CMP with Na 139, K 4, Cl 99, CO2 27, glucose 98, BUN 10, Cr 0.90, Ca 9.7, t bili 0.3, ALP 74, AST 20, ALT 19, GFR >60    Vision: No issues Motor: No issues Sensory: No issues Pain: Lower abdominal pain being worked up by PCP Gait: No issues Bowel/Bladder: No issues Fatigue: yesterday he felt fatigued but now okay. Cognition: No issues Mood: Denies depression or anxiety   HISTORY: He was diagnosed with MS in 2001.  At that time, he presented with left facial myokymia. He reports that he hasn't had many flare ups, and only a couple of flare-ups requiring pulse steroids.  They typically present as blurred vision, headache, dizziness, unsteadiness and fatigue.  He has both supratentorial and infratentorial demyelinating lesions.  His cervical cord is okay.  He has a history of lapses with physician follow-ups and non-adherence to disease-modifying agents, mostly due to financial reasons.  He previously was on Avonex, and then Rebif.  Due to financial reasons, he has not followed up with a neurologist in between 2013 and 2014 and had not taken his Rebif.  He developed increased fatigue, stiffness, blurred vision and intermittent headaches, similar to prior flare ups.  He presented to the ED on 05/03/13.  Physical exam revealed  slight right sided facial droop.  Initially, a CT of the head was performed and revealed decreased attenuation at the right subfrontal-anterior temporal junction.  MRI Brain w/wo contrast was performed, and revealed extensive periventricular and subcortical T2 hyperintensities, as well as focal restricted diffusion within the splenium of the corpus callosum, concerning for active demyelination.  He has since restarted Rebif.   MRI of brain with and without contrast from 11/03/16 was stable compared to prior MRIs from 02/14/15, 03/08/14 and 07/13/13.  MRI of brain with and without contrast from 12/02/19 showed mildly progressed multifocal posterior frontal and parietal lobe cortical involvement appears mildly progressed since 2018.  Evidence of chronic plaque in dorsal upper spinal cord at C2.   MRI of brain with and without contrast on 11/20/2020 stable compared to prior imaging from 12/02/2019.   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  PAST MEDICAL HISTORY: Past Medical History:  Diagnosis Date   DM type 2 (diabetes mellitus, type 2) ([place])    Hyperlipidemia    MS (multiple sclerosis) ([place])    Prostate cancer ([place])     MEDICATIONS: Current Outpatient Medications on File Prior to Visit  Medication Sig Dispense Refill   cholecalciferol (VITAMIN D) 1000 UNITS tablet Take 1,000 Units by mouth daily. VIT D 3 2000 UNITS TAKES 2 TABS = 4000 UNITS     Empagliflozin-metFORMIN HCl (SYNJARDY) 5-500 MG TABS Take 1 tablet by mouth 2 (two) times daily. (Patient not  taking: No sig reported) 180 tablet 0   Ferric Maltol (ACCRUFER) 30 MG CAPS Take 1 capsule by mouth in the morning and at bedtime. 180 capsule 1   HYDROcodone-acetaminophen (NORCO) 5-325 MG tablet Take 1 tablet by mouth every 6 (six) hours as needed. 75 tablet 0   indapamide (LOZOL) 1.25 MG tablet TAKE 1 TABLET(1.25 MG) BY MOUTH DAILY 90 tablet 1   interferon beta-1a (REBIF) 44 MCG/0.5ML SOSY injection Inject one syringe  subcutaneously three times per week. Keep Refrigerated. 12 mL 0   meloxicam (MOBIC) 15 MG tablet Take 15 mg by mouth daily. (Patient not taking: No sig reported)     Multiple Vitamins-Minerals (MULTIVITAMIN WITH MINERALS) tablet Take 1 tablet by mouth daily.     rosuvastatin (CRESTOR) 10 MG tablet Take 1 tablet (10 mg total) by mouth daily. 90 tablet 1   tamsulosin (FLOMAX) 0.4 MG CAPS capsule Take 1 capsule (0.4 mg total) by mouth daily. (Patient taking differently: Take 0.4 mg by mouth at bedtime.) 7 capsule 0   No current facility-administered medications on file prior to visit.    ALLERGIES: No Known Allergies  FAMILY HISTORY: Family History  Problem Relation Age of Onset   Hypertension Mother    Ovarian cancer Mother    Hypertension Father    Prostate cancer Father    Diabetes Sister    Diabetes Brother    Ataxia Neg Hx    Chorea Neg Hx    Dementia Neg Hx    Mental retardation Neg Hx    Migraines Neg Hx    Multiple sclerosis Neg Hx    Neurofibromatosis Neg Hx    Neuropathy Neg Hx    Parkinsonism Neg Hx    Seizures Neg Hx    Stroke Neg Hx    Early death Neg Hx    Cancer Neg Hx    Alcohol abuse Neg Hx    Hearing loss Neg Hx    Heart disease Neg Hx    Hyperlipidemia Neg Hx    Kidney disease Neg Hx    Breast cancer Neg Hx    Colon cancer Neg Hx    Pancreatic cancer Neg Hx    Rectal cancer Neg Hx    Stomach cancer Neg Hx    Esophageal cancer Neg Hx       Objective:  Blood pressure (!) 158/100, pulse 100, height 5\' 9"  (1.753 m), weight 199 lb (90.3 kg), SpO2 99 %. General: No acute distress.  Patient appears well-groomed.   Head:  Normocephalic/atraumatic Eyes:  Fundi examined but not visualized Neck: supple, no paraspinal tenderness, full range of motion Heart:  Regular rate and rhythm Lungs:  Clear to auscultation bilaterally Back: No paraspinal tenderness Neurological Exam: alert and oriented to person, place, and time.  Speech fluent and not dysarthric,  language intact.  CN II-XII intact. Bulk and tone normal, muscle strength 5/5 throughout.  Sensation to temperature and vibration intact.  Deep tendon reflexes 2+ upper extremities, 3+ lower extremities, toes downgoing.  Finger to nose testing intact.  Gait normal, Romberg negative.   Devon Clines, DO  CC: Scarlette Calico, MD

## 2021-08-29 ENCOUNTER — Other Ambulatory Visit (INDEPENDENT_AMBULATORY_CARE_PROVIDER_SITE_OTHER): Payer: BC Managed Care – PPO

## 2021-08-29 ENCOUNTER — Other Ambulatory Visit: Payer: Self-pay

## 2021-08-29 ENCOUNTER — Ambulatory Visit (INDEPENDENT_AMBULATORY_CARE_PROVIDER_SITE_OTHER): Payer: BC Managed Care – PPO | Admitting: Neurology

## 2021-08-29 ENCOUNTER — Encounter: Payer: Self-pay | Admitting: Neurology

## 2021-08-29 VITALS — BP 158/100 | HR 100 | Ht 69.0 in | Wt 199.0 lb

## 2021-08-29 DIAGNOSIS — G35 Multiple sclerosis: Secondary | ICD-10-CM

## 2021-08-29 DIAGNOSIS — I10 Essential (primary) hypertension: Secondary | ICD-10-CM

## 2021-08-29 LAB — COMPREHENSIVE METABOLIC PANEL
ALT: 14 U/L (ref 0–53)
AST: 15 U/L (ref 0–37)
Albumin: 4.4 g/dL (ref 3.5–5.2)
Alkaline Phosphatase: 73 U/L (ref 39–117)
BUN: 10 mg/dL (ref 6–23)
CO2: 29 mEq/L (ref 19–32)
Calcium: 9.7 mg/dL (ref 8.4–10.5)
Chloride: 102 mEq/L (ref 96–112)
Creatinine, Ser: 0.92 mg/dL (ref 0.40–1.50)
GFR: 90.43 mL/min (ref >60.00)
Glucose, Bld: 92 mg/dL (ref 70–99)
Potassium: 4.1 mEq/L (ref 3.5–5.1)
Sodium: 138 mEq/L (ref 135–145)
Total Bilirubin: 0.4 mg/dL (ref 0.2–1.2)
Total Protein: 7.7 g/dL (ref 6.0–8.3)

## 2021-08-29 LAB — VITAMIN D 25 HYDROXY (VIT D DEFICIENCY, FRACTURES): VITD: 52.02 ng/mL (ref 30.00–100.00)

## 2021-08-29 NOTE — Patient Instructions (Addendum)
Continue Rebif Continue D3 4000 IU daily Check D level today Check CBC with diff, CMP and D in 6 months.Your provider has requested that you have labwork completed today. Please go to [facility] ([id number]) on the second floor of this building before leaving the office today. You do not need to check in. If you are not called within 15 minutes please check with the front desk.   Repeat MRI of brain and cervical spine with and without contrast in 6 months.We have sent a referral to [facility] for your MRI and they will call you directly to schedule your appointment. They are located at [place]. If you need to contact them directly please call [phone].  Follow up in 6 months (after testing). Follow up with PCP regarding elevated blood pressure

## 2021-08-30 LAB — CBC WITH DIFFERENTIAL
Basophils Absolute: 0 10*3/uL (ref 0.0–0.2)
Basos: 1 %
EOS (ABSOLUTE): 0.1 10*3/uL (ref 0.0–0.4)
Eos: 2 %
Hematocrit: 37 % — ABNORMAL LOW (ref 37.5–51.0)
Hemoglobin: 12.5 g/dL — ABNORMAL LOW (ref 13.0–17.7)
Immature Grans (Abs): 0 10*3/uL (ref 0.0–0.1)
Immature Granulocytes: 0 %
Lymphocytes Absolute: 0.7 10*3/uL (ref 0.7–3.1)
Lymphs: 15 %
MCH: 28.5 pg (ref 26.6–33.0)
MCHC: 33.8 g/dL (ref 31.5–35.7)
MCV: 84 fL (ref 79–97)
Monocytes Absolute: 0.5 10*3/uL (ref 0.1–0.9)
Monocytes: 12 %
Neutrophils Absolute: 3.2 10*3/uL (ref 1.4–7.0)
Neutrophils: 70 %
RBC: 4.39 x10E6/uL (ref 4.14–5.80)
RDW: 13.8 % (ref 11.6–15.4)
WBC: 4.5 10*3/uL (ref 3.4–10.8)

## 2021-08-30 NOTE — Progress Notes (Signed)
Pt advised of his lab results.

## 2021-09-04 ENCOUNTER — Telehealth: Payer: Self-pay | Admitting: Neurology

## 2021-09-04 NOTE — Telephone Encounter (Signed)
Pt called in stating the pharmacy told him he needed prior authorization for his Rebif.

## 2021-09-05 NOTE — Telephone Encounter (Signed)
Pt advised we are aware he has a PA for his medication and Jari Sportsman is working on getting that approved. We keep updated with any issues.

## 2021-09-06 NOTE — Telephone Encounter (Signed)
F/u   Received fax from [place] should received information directly from the insurer regarding this denial and any appeal process.

## 2021-09-07 ENCOUNTER — Telehealth: Payer: Self-pay | Admitting: Neurology

## 2021-09-07 NOTE — Telephone Encounter (Signed)
Patient is calling to check on his medicine that was denied, he hasn't heard back from anyone

## 2021-09-07 NOTE — Telephone Encounter (Signed)
Advised pt, We will try and see if we can do a PEER-PEER or see what DR.Tomi Likens wants the next steps to be.

## 2021-09-12 ENCOUNTER — Telehealth: Payer: Self-pay | Admitting: Neurology

## 2021-09-12 NOTE — Telephone Encounter (Signed)
Patients called and stated he took his last injection yesterday for his medication Rebif.  He was following up.

## 2021-09-14 NOTE — Telephone Encounter (Signed)
Advised pt we are trying our best to get the medication.   Gave patient assistance program [phone].   Also tried calling patient insurance company [facility] [place], [phone], Spoke to [place] [phone] marked as Urgent should receive response in 24 hours. Patient out of his medication.

## 2021-09-17 ENCOUNTER — Telehealth: Payer: Self-pay

## 2021-09-17 NOTE — Telephone Encounter (Signed)
New message   Received fax from [facility]   Approval Medication Rebif   Effective date  2.10.23 to  2.10.24

## 2021-09-24 ENCOUNTER — Other Ambulatory Visit: Payer: Self-pay | Admitting: Neurology

## 2021-10-25 ENCOUNTER — Other Ambulatory Visit: Payer: Self-pay | Admitting: Internal Medicine

## 2021-10-25 DIAGNOSIS — E118 Type 2 diabetes mellitus with unspecified complications: Secondary | ICD-10-CM

## 2021-11-13 LAB — PSA: PSA: 0

## 2021-11-23 ENCOUNTER — Other Ambulatory Visit: Payer: Self-pay | Admitting: Internal Medicine

## 2021-11-23 ENCOUNTER — Encounter: Payer: Self-pay | Admitting: Internal Medicine

## 2021-12-19 LAB — HM DIABETES EYE EXAM

## 2022-02-26 ENCOUNTER — Ambulatory Visit
Admission: RE | Admit: 2022-02-26 | Discharge: 2022-02-26 | Disposition: A | Discharge status: Home / Self Care | Payer: BC Managed Care – PPO | Source: Ambulatory Visit | Attending: Neurology | Admitting: Neurology

## 2022-02-26 DIAGNOSIS — G35 Multiple sclerosis: Secondary | ICD-10-CM

## 2022-02-26 MED ORDER — GADOBENATE DIMEGLUMINE 529 MG/ML IV SOLN
20.0000 mL | Freq: Once | INTRAVENOUS | Status: AC | PRN
  Administered 2022-02-26: 20 mL via INTRAVENOUS

## 2022-03-03 NOTE — Progress Notes (Unsigned)
NEUROLOGY FOLLOW UP OFFICE NOTE  Devon Barron [id number]  Assessment/Plan:   Multiple sclerosis Hypertension   DMT:  Rebif D3 4000 IU daily - check level today CBC with diff, CMP and vit D today and again in 6 months MRI of brain and cervical spine with and without contrast in 6 months Follow up in 6 months (after repeat tests) Follow up with PCP regarding elevated blood pressure   Subjective:  Devon Barron is a 61 year old right-handed man who follows up for multiple sclerosis   UPDATE: Current disease modifying therapy: Rebif Other current medications: D3 4000 IU daily  MRI of brain and cervical spine with and without contrast on 02/26/2022 personally reviewed were stable compared to imaging on 11/18/2020.   Lab from 08/29/2021:  CBC with WBC 4.5, HGB 12.5, HCT 37, ALC 0.7; CMP with Na 138, K 4.1, Cl 102, Co2 29, glucose 92, BUN 10, Cr 0.92, t bili 0.4, ALP 3, AST 15, ALT 14; vit D 52.02     Vision: No issues Motor: No issues Sensory: No issues Pain: Lower abdominal pain being worked up by PCP Gait: No issues Bowel/Bladder: No issues Fatigue: yesterday he felt fatigued but now okay. Cognition: No issues Mood: Denies depression or anxiety   HISTORY: He was diagnosed with MS in 2001.  At that time, he presented with left facial myokymia. He reports that he hasn't had many flare ups, and only a couple of flare-ups requiring pulse steroids.  They typically present as blurred vision, headache, dizziness, unsteadiness and fatigue.  He has both supratentorial and infratentorial demyelinating lesions.  His cervical cord is okay.  He has a history of lapses with physician follow-ups and non-adherence to disease-modifying agents, mostly due to financial reasons.  He previously was on Avonex, and then Rebif.  Due to financial reasons, he has not followed up with a neurologist in between 2013 and 2014 and had not taken his Rebif.  He developed increased fatigue, stiffness, blurred  vision and intermittent headaches, similar to prior flare ups.  He presented to the ED on 05/03/13.  Physical exam revealed slight right sided facial droop.  Initially, a CT of the head was performed and revealed decreased attenuation at the right subfrontal-anterior temporal junction.  MRI Brain w/wo contrast was performed, and revealed extensive periventricular and subcortical T2 hyperintensities, as well as focal restricted diffusion within the splenium of the corpus callosum, concerning for active demyelination.  He has since restarted Rebif.   MRI of brain with and without contrast from 11/03/16 was stable compared to prior MRIs from 02/14/15, 03/08/14 and 07/13/13.  MRI of brain with and without contrast from 12/02/19 showed mildly progressed multifocal posterior frontal and parietal lobe cortical involvement appears mildly progressed since 2018.  Evidence of chronic plaque in dorsal upper spinal cord at C2.   MRI of brain with and without contrast on 11/20/2020 stable compared to prior imaging from 12/02/2019.   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  PAST MEDICAL HISTORY: Past Medical History:  Diagnosis Date   DM type 2 (diabetes mellitus, type 2) ([place])    Hyperlipidemia    MS (multiple sclerosis) ([place])    Prostate cancer ([place])     MEDICATIONS: Current Outpatient Medications on File Prior to Visit  Medication Sig Dispense Refill   cholecalciferol (VITAMIN D) 1000 UNITS tablet Take 1,000 Units by mouth daily. VIT D 3 2000 UNITS TAKES 2 TABS = 4000 UNITS  Empagliflozin-metFORMIN HCl (SYNJARDY) 5-500 MG TABS Take 1 tablet by mouth 2 (two) times daily. 180 tablet 0   Ferric Maltol (ACCRUFER) 30 MG CAPS Take 1 capsule by mouth in the morning and at bedtime. 180 capsule 1   indapamide (LOZOL) 1.25 MG tablet TAKE 1 TABLET(1.25 MG) BY MOUTH DAILY 90 tablet 1   Multiple Vitamins-Minerals (MULTIVITAMIN WITH MINERALS) tablet Take 1 tablet by mouth daily.     REBIF 44  MCG/0.5ML SOSY injection INJECT ONE SYRINGE SUBCUTANEOUSLY THREE TIMES PER WEEK. KEEP REFRIGERATED. 12 mL 5   No current facility-administered medications on file prior to visit.    ALLERGIES: No Known Allergies  FAMILY HISTORY: Family History  Problem Relation Age of Onset   Hypertension Mother    Ovarian cancer Mother    Hypertension Father    Prostate cancer Father    Diabetes Sister    Diabetes Brother    Ataxia Neg Hx    Chorea Neg Hx    Dementia Neg Hx    Mental retardation Neg Hx    Migraines Neg Hx    Multiple sclerosis Neg Hx    Neurofibromatosis Neg Hx    Neuropathy Neg Hx    Parkinsonism Neg Hx    Seizures Neg Hx    Stroke Neg Hx    Early death Neg Hx    Cancer Neg Hx    Alcohol abuse Neg Hx    Hearing loss Neg Hx    Heart disease Neg Hx    Hyperlipidemia Neg Hx    Kidney disease Neg Hx    Breast cancer Neg Hx    Colon cancer Neg Hx    Pancreatic cancer Neg Hx    Rectal cancer Neg Hx    Stomach cancer Neg Hx    Esophageal cancer Neg Hx       Objective:  *** General: No acute distress.  Patient appears well-groomed.   Head:  Normocephalic/atraumatic Eyes:  Fundi examined but not visualized Neck: supple, no paraspinal tenderness, full range of motion Heart:  Regular rate and rhythm Neurological Exam: alert and oriented to person, place, and time.  Speech fluent and not dysarthric, language intact.  CN II-XII intact. Bulk and tone normal, muscle strength 5/5 throughout.  Sensation to light touch intact.  Deep tendon reflexes 2+ throughout, toes downgoing.  Finger to nose testing intact.  Gait normal, Romberg negative.   Devon Clines, DO  CC: Devon Calico, MD

## 2022-03-04 ENCOUNTER — Other Ambulatory Visit (INDEPENDENT_AMBULATORY_CARE_PROVIDER_SITE_OTHER): Payer: BC Managed Care – PPO

## 2022-03-04 ENCOUNTER — Ambulatory Visit (INDEPENDENT_AMBULATORY_CARE_PROVIDER_SITE_OTHER): Payer: BC Managed Care – PPO | Admitting: Neurology

## 2022-03-04 ENCOUNTER — Encounter: Payer: Self-pay | Admitting: Neurology

## 2022-03-04 VITALS — BP 135/85 | HR 112 | Ht 69.0 in | Wt 208.0 lb

## 2022-03-04 DIAGNOSIS — G35 Multiple sclerosis: Secondary | ICD-10-CM

## 2022-03-04 LAB — CBC WITH DIFFERENTIAL/PLATELET
Basophils Absolute: 0 10*3/uL (ref 0.0–0.1)
Basophils Relative: 0.6 % (ref 0.0–3.0)
Eosinophils Absolute: 0.1 10*3/uL (ref 0.0–0.7)
Eosinophils Relative: 1.3 % (ref 0.0–5.0)
HCT: 36 % — ABNORMAL LOW (ref 39.0–52.0)
Hemoglobin: 11.8 g/dL — ABNORMAL LOW (ref 13.0–17.0)
Lymphocytes Relative: 20.2 % (ref 12.0–46.0)
Lymphs Abs: 0.9 10*3/uL (ref 0.7–4.0)
MCHC: 32.9 g/dL (ref 30.0–36.0)
MCV: 88.2 fl (ref 78.0–100.0)
Monocytes Absolute: 0.6 10*3/uL (ref 0.1–1.0)
Monocytes Relative: 13.3 % — ABNORMAL HIGH (ref 3.0–12.0)
Neutro Abs: 2.8 10*3/uL (ref 1.4–7.7)
Neutrophils Relative %: 64.6 % (ref 43.0–77.0)
Platelets: 201 10*3/uL (ref 150.0–400.0)
RBC: 4.08 Mil/uL — ABNORMAL LOW (ref 4.22–5.81)
RDW: 13.7 % (ref 11.5–15.5)
WBC: 4.3 10*3/uL (ref 4.0–10.5)

## 2022-03-04 LAB — COMPREHENSIVE METABOLIC PANEL
ALT: 22 U/L (ref 0–53)
AST: 21 U/L (ref 0–37)
Albumin: 4.3 g/dL (ref 3.5–5.2)
Alkaline Phosphatase: 77 U/L (ref 39–117)
BUN: 9 mg/dL (ref 6–23)
CO2: 30 mEq/L (ref 19–32)
Calcium: 9.4 mg/dL (ref 8.4–10.5)
Chloride: 105 mEq/L (ref 96–112)
Creatinine, Ser: 0.99 mg/dL (ref 0.40–1.50)
GFR: 82.52 mL/min (ref >60.00)
Glucose, Bld: 104 mg/dL — ABNORMAL HIGH (ref 70–99)
Potassium: 4.1 mEq/L (ref 3.5–5.1)
Sodium: 141 mEq/L (ref 135–145)
Total Bilirubin: 0.3 mg/dL (ref 0.2–1.2)
Total Protein: 7.8 g/dL (ref 6.0–8.3)

## 2022-03-04 MED ORDER — REBIF 44 MCG/0.5ML ~~LOC~~ SOSY: PREFILLED_SYRINGE | SUBCUTANEOUS | 5 refills | Status: DC

## 2022-03-04 NOTE — Patient Instructions (Signed)
Continue Rebif Continue D3 4000 IU daily Check CBC with diff and CMP Follow up 6 months.

## 2022-03-06 ENCOUNTER — Other Ambulatory Visit (HOSPITAL_COMMUNITY): Payer: Self-pay

## 2022-04-10 ENCOUNTER — Other Ambulatory Visit: Payer: Self-pay | Admitting: Neurology

## 2022-04-17 ENCOUNTER — Encounter: Payer: Self-pay | Admitting: Internal Medicine

## 2022-04-17 ENCOUNTER — Ambulatory Visit: Payer: BC Managed Care – PPO | Admitting: Internal Medicine

## 2022-04-17 VITALS — BP 136/86 | HR 100 | Temp 98.1°F | Ht 69.0 in | Wt 208.0 lb

## 2022-04-17 DIAGNOSIS — D500 Iron deficiency anemia secondary to blood loss (chronic): Secondary | ICD-10-CM | POA: Diagnosis not present

## 2022-04-17 DIAGNOSIS — E118 Type 2 diabetes mellitus with unspecified complications: Secondary | ICD-10-CM

## 2022-04-17 DIAGNOSIS — E785 Hyperlipidemia, unspecified: Secondary | ICD-10-CM | POA: Diagnosis not present

## 2022-04-17 DIAGNOSIS — I10 Essential (primary) hypertension: Secondary | ICD-10-CM

## 2022-04-17 DIAGNOSIS — Z0000 Encounter for general adult medical examination without abnormal findings: Secondary | ICD-10-CM | POA: Diagnosis not present

## 2022-04-17 LAB — CBC WITH DIFFERENTIAL/PLATELET
Basophils Absolute: 0.1 10*3/uL (ref 0.0–0.1)
Basophils Relative: 1.1 % (ref 0.0–3.0)
Eosinophils Absolute: 0.1 10*3/uL (ref 0.0–0.7)
Eosinophils Relative: 2.3 % (ref 0.0–5.0)
HCT: 37.9 % — ABNORMAL LOW (ref 39.0–52.0)
Hemoglobin: 12.4 g/dL — ABNORMAL LOW (ref 13.0–17.0)
Lymphocytes Relative: 14.7 % (ref 12.0–46.0)
Lymphs Abs: 0.7 10*3/uL (ref 0.7–4.0)
MCHC: 32.6 g/dL (ref 30.0–36.0)
MCV: 87.5 fl (ref 78.0–100.0)
Monocytes Absolute: 0.6 10*3/uL (ref 0.1–1.0)
Monocytes Relative: 11.6 % (ref 3.0–12.0)
Neutro Abs: 3.5 10*3/uL (ref 1.4–7.7)
Neutrophils Relative %: 70.3 % (ref 43.0–77.0)
Platelets: 225 10*3/uL (ref 150.0–400.0)
RBC: 4.33 Mil/uL (ref 4.22–5.81)
RDW: 14.3 % (ref 11.5–15.5)
WBC: 4.9 10*3/uL (ref 4.0–10.5)

## 2022-04-17 LAB — URINALYSIS, ROUTINE W REFLEX MICROSCOPIC
Bilirubin Urine: NEGATIVE
Hgb urine dipstick: NEGATIVE
Ketones, ur: NEGATIVE
Leukocytes,Ua: NEGATIVE
Nitrite: NEGATIVE
RBC / HPF: NONE SEEN (ref 0–?)
Specific Gravity, Urine: >=1.03 — AB (ref 1.000–1.030)
Total Protein, Urine: NEGATIVE
Urine Glucose: NEGATIVE
Urobilinogen, UA: 1 (ref 0.0–1.0)
pH: 6.5 (ref 5.0–8.0)

## 2022-04-17 LAB — IBC + FERRITIN
Ferritin: 625.2 ng/mL — ABNORMAL HIGH (ref 22.0–322.0)
Iron: 85 ug/dL (ref 42–165)
Saturation Ratios: 29.2 % (ref 20.0–50.0)
TIBC: 291.2 ug/dL (ref 250.0–450.0)
Transferrin: 208 mg/dL — ABNORMAL LOW (ref 212.0–360.0)

## 2022-04-17 LAB — LIPID PANEL
Cholesterol: 190 mg/dL (ref 0–200)
HDL: 48.5 mg/dL (ref >39.00)
NonHDL: 141.61
Total CHOL/HDL Ratio: 4
Triglycerides: 217 mg/dL — ABNORMAL HIGH (ref 0.0–149.0)
VLDL: 43.4 mg/dL — ABNORMAL HIGH (ref 0.0–40.0)

## 2022-04-17 LAB — LDL CHOLESTEROL, DIRECT: Direct LDL: 102 mg/dL

## 2022-04-17 LAB — MICROALBUMIN / CREATININE URINE RATIO
Creatinine,U: 335.2 mg/dL
Microalb Creat Ratio: 1.1 mg/g (ref 0.0–30.0)
Microalb, Ur: 3.8 mg/dL — ABNORMAL HIGH (ref 0.0–1.9)

## 2022-04-17 LAB — HEMOGLOBIN A1C: Hgb A1c MFr Bld: 6.4 % (ref 4.6–6.5)

## 2022-04-17 MED ORDER — ROSUVASTATIN CALCIUM 5 MG PO TABS: 5.0000 mg | ORAL_TABLET | Freq: Every day | ORAL | 1 refills | Status: DC

## 2022-04-17 NOTE — Patient Instructions (Signed)
Health Maintenance, Male Adopting a healthy lifestyle and getting preventive care are important in promoting health and wellness. Ask your health care provider about: The right schedule for you to have regular tests and exams. Things you can do on your own to prevent diseases and keep yourself healthy. What should I know about diet, weight, and exercise? Eat a healthy diet  Eat a diet that includes plenty of vegetables, fruits, low-fat dairy products, and lean protein. Do not eat a lot of foods that are high in solid fats, added sugars, or sodium. Maintain a healthy weight Body mass index (BMI) is a measurement that can be used to identify possible weight problems. It estimates body fat based on height and weight. Your health care provider can help determine your BMI and help you achieve or maintain a healthy weight. Get regular exercise Get regular exercise. This is one of the most important things you can do for your health. Most adults should: Exercise for at least 150 minutes each week. The exercise should increase your heart rate and make you sweat (moderate-intensity exercise). Do strengthening exercises at least twice a week. This is in addition to the moderate-intensity exercise. Spend less time sitting. Even light physical activity can be beneficial. Watch cholesterol and blood lipids Have your blood tested for lipids and cholesterol at 61 years of age, then have this test every 5 years. You may need to have your cholesterol levels checked more often if: Your lipid or cholesterol levels are high. You are older than 61 years of age. You are at high risk for heart disease. What should I know about cancer screening? Many types of cancers can be detected early and may often be prevented. Depending on your health history and family history, you may need to have cancer screening at various ages. This may include screening for: Colorectal cancer. Prostate cancer. Skin cancer. Lung  cancer. What should I know about heart disease, diabetes, and high blood pressure? Blood pressure and heart disease High blood pressure causes heart disease and increases the risk of stroke. This is more likely to develop in people who have high blood pressure readings or are overweight. Talk with your health care provider about your target blood pressure readings. Have your blood pressure checked: Every 3-5 years if you are 18-39 years of age. Every year if you are 40 years old or older. If you are between the ages of 65 and 75 and are a current or former smoker, ask your health care provider if you should have a one-time screening for abdominal aortic aneurysm (AAA). Diabetes Have regular diabetes screenings. This checks your fasting blood sugar level. Have the screening done: Once every three years after age 45 if you are at a normal weight and have a low risk for diabetes. More often and at a younger age if you are overweight or have a high risk for diabetes. What should I know about preventing infection? Hepatitis B If you have a higher risk for hepatitis B, you should be screened for this virus. Talk with your health care provider to find out if you are at risk for hepatitis B infection. Hepatitis C Blood testing is recommended for: Everyone born from 1945 through 1965. Anyone with known risk factors for hepatitis C. Sexually transmitted infections (STIs) You should be screened each year for STIs, including gonorrhea and chlamydia, if: You are sexually active and are younger than 61 years of age. You are older than 61 years of age and your   health care provider tells you that you are at risk for this type of infection. Your sexual activity has changed since you were last screened, and you are at increased risk for chlamydia or gonorrhea. Ask your health care provider if you are at risk. Ask your health care provider about whether you are at high risk for HIV. Your health care provider  may recommend a prescription medicine to help prevent HIV infection. If you choose to take medicine to prevent HIV, you should first get tested for HIV. You should then be tested every 3 months for as long as you are taking the medicine. Follow these instructions at home: Alcohol use Do not drink alcohol if your health care provider tells you not to drink. If you drink alcohol: Limit how much you have to 0-2 drinks a day. Know how much alcohol is in your drink. In the [place] one drink equals one 12 oz bottle of beer (355 mL), one 5 oz glass of wine (148 mL), or one 1 oz glass of hard liquor (44 mL). Lifestyle Do not use any products that contain nicotine or tobacco. These products include cigarettes, chewing tobacco, and vaping devices, such as e-cigarettes. If you need help quitting, ask your health care provider. Do not use street drugs. Do not share needles. Ask your health care provider for help if you need support or information about quitting drugs. General instructions Schedule regular health, dental, and eye exams. Stay current with your vaccines. Tell your health care provider if: You often feel depressed. You have ever been abused or do not feel safe at home. Summary Adopting a healthy lifestyle and getting preventive care are important in promoting health and wellness. Follow your health care provider's instructions about healthy diet, exercising, and getting tested or screened for diseases. Follow your health care provider's instructions on monitoring your cholesterol and blood pressure. This information is not intended to replace advice given to you by your health care provider. Make sure you discuss any questions you have with your health care provider. Document Revised: 12/11/2020 Document Reviewed: 12/11/2020 Elsevier Patient Education  2023 Elsevier Inc.  

## 2022-04-17 NOTE — Progress Notes (Unsigned)
Subjective:  Patient ID: Devon Barron, male    DOB: [birth year]  Age: 61 y.o. MRN: [id number]  CC: Annual Exam, Diabetes, Anemia, and Hyperlipidemia   HPI Devon Barron presents for a CPX and f/up -   He recently saw a urologist and is being treated for a prostate gland infection with Bactrim DS.  He continues to complain of dysuria.  He complains of musculoskeletal pain in his lower extremities that interferes with his work.  He would like to file for FMLA to relieve the discomfort.  He is active and denies chest pain, shortness of breath, diaphoresis, or edema.  Outpatient Medications Prior to Visit  Medication Sig Dispense Refill   cholecalciferol (VITAMIN D) 1000 UNITS tablet Take 1,000 Units by mouth daily. VIT D 3 2000 UNITS TAKES 2 TABS = 4000 UNITS     Ferric Maltol (ACCRUFER) 30 MG CAPS Take 1 capsule by mouth in the morning and at bedtime. 180 capsule 1   Multiple Vitamins-Minerals (MULTIVITAMIN WITH MINERALS) tablet Take 1 tablet by mouth daily.     REBIF 44 MCG/0.5ML SOSY injection INJECT ONE SYRINGE SUBCUTANEOUSLY THREE TIMES PER WEEK. KEEP REFRIGERATED. 6 mL 5   No facility-administered medications prior to visit.    ROS Review of Systems  Constitutional: Negative.  Negative for appetite change, diaphoresis, fatigue and fever.  Eyes: Negative.   Respiratory: Negative.  Negative for cough, chest tightness, shortness of breath and wheezing.   Cardiovascular:  Negative for chest pain, palpitations and leg swelling.  Gastrointestinal:  Negative for abdominal pain, constipation, diarrhea, nausea and vomiting.  Endocrine: Negative.   Genitourinary:  Positive for dysuria. Negative for difficulty urinating and hematuria.  Musculoskeletal:  Positive for arthralgias. Negative for myalgias.  Skin: Negative.   Neurological: Negative.  Negative for dizziness, weakness and light-headedness.  Hematological:  Negative for adenopathy. Does not bruise/bleed easily.   Psychiatric/Behavioral: Negative.      Objective:  BP 136/86 (BP Location: Left Arm, Patient Position: Sitting, Cuff Size: Large)   Pulse 100   Temp 98.1 F (36.7 C) (Oral)   Ht '5\' 9"'$  (1.753 m)   Wt 208 lb (94.3 kg)   SpO2 94%   BMI 30.72 kg/m   BP Readings from Last 3 Encounters:  04/17/22 136/86  03/04/22 135/85  08/29/21 (!) 158/100    Wt Readings from Last 3 Encounters:  04/17/22 208 lb (94.3 kg)  03/04/22 208 lb (94.3 kg)  08/29/21 199 lb (90.3 kg)    Physical Exam Vitals reviewed.  Constitutional:      General: He is not in acute distress.    Appearance: He is not ill-appearing, toxic-appearing or diaphoretic.  HENT:     Nose: Nose normal.  Eyes:     General: No scleral icterus.    Conjunctiva/sclera: Conjunctivae normal.  Cardiovascular:     Rate and Rhythm: Normal rate and regular rhythm.     Heart sounds: No murmur heard.    Comments: EKG- NSR, 92 bpm ??LAE NS T wave changes similar to the prior EKG No LVH or Q waves Pulmonary:     Effort: Pulmonary effort is normal.     Breath sounds: No stridor. No wheezing, rhonchi or rales.  Abdominal:     General: Abdomen is flat.     Palpations: There is no mass.     Tenderness: There is no abdominal tenderness. There is no guarding.     Hernia: No hernia is present.  Musculoskeletal:  General: Normal range of motion.     Cervical back: Neck supple.     Right lower leg: No edema.     Left lower leg: No edema.  Lymphadenopathy:     Cervical: No cervical adenopathy.  Skin:    General: Skin is warm and dry.  Neurological:     General: No focal deficit present.     Mental Status: He is alert.  Psychiatric:        Mood and Affect: Mood normal.        Behavior: Behavior normal.     Lab Results  Component Value Date   WBC 4.9 04/17/2022   HGB 12.4 (L) 04/17/2022   HCT 37.9 (L) 04/17/2022   PLT 225.0 04/17/2022   GLUCOSE 104 (H) 03/04/2022   CHOL 190 04/17/2022   TRIG 217.0 (H) 04/17/2022    HDL 48.50 04/17/2022   LDLDIRECT 102.0 04/17/2022   LDLCALC 114 (H) 10/05/2020   ALT 22 03/04/2022   AST 21 03/04/2022   NA 141 03/04/2022   K 4.1 03/04/2022   CL 105 03/04/2022   CREATININE 0.99 03/04/2022   BUN 9 03/04/2022   CO2 30 03/04/2022   TSH 0.95 02/13/2021   PSA 0.27 03/15/2021   INR 1.0 12/04/2020   HGBA1C 6.4 04/17/2022   MICROALBUR 3.8 (H) 04/17/2022    MR BRAIN W WO CONTRAST  Result Date: 02/26/2022 CLINICAL DATA:  Follow-up multiple sclerosis EXAM: MRI HEAD WITHOUT AND WITH CONTRAST TECHNIQUE: Multiplanar, multiecho pulse sequences of the brain and surrounding structures were obtained without and with intravenous contrast. CONTRAST:  16m MULTIHANCE GADOBENATE DIMEGLUMINE 529 MG/ML IV SOLN COMPARISON:  11/18/2020 FINDINGS: Brain: Diffusion imaging does not show any acute infarction. Few areas of T2 shine through within the deep white matter disease are unchanged since the prior study. Small foci of abnormal T2 signal within the pons, middle cerebral peduncle on the left and cerebellum are stable. Foci of abnormal T2 and FLAIR signal affecting the white matter, sub cortical less than D are stable. Some cortical involvement particularly notable at the frontoparietal vertices is stable. No new or progressive lesions. No lesions show abnormal contrast enhancement. Vascular: Major vessels at the base of the brain show flow. Skull and upper cervical spine: Negative Sinuses/Orbits: Clear/normal Other: None IMPRESSION: Stable findings of multiple sclerosis affecting the brainstem, cerebellum and cerebral hemispheres as above. No new or progressive lesions. No lesions show true restricted diffusion or contrast enhancement. Few areas of T2 shine through are stable. Electronically Signed   By: MNelson ChimesM.D.   On: 02/26/2022 13:24   MR CERVICAL SPINE W WO CONTRAST  Result Date: 02/26/2022 CLINICAL DATA:  Multiple sclerosis.  Follow-up diagnosis 2021 EXAM: MRI CERVICAL SPINE WITHOUT  AND WITH CONTRAST TECHNIQUE: Multiplanar and multiecho pulse sequences of the cervical spine, to include the craniocervical junction and cervicothoracic junction, were obtained without and with intravenous contrast. CONTRAST:  233mMULTIHANCE GADOBENATE DIMEGLUMINE 529 MG/ML IV SOLN COMPARISON:  MRI 10/10/2013 FINDINGS: Alignment: Normal Vertebrae: Normal Cord: Stable examination since 2015. Focal T2 signal within the dorsal midline cord at the C2-3 level has not progressed and does not show any contrast enhancement. Tiny focus of signal within the cord to the right of midline at the C6-7 level is similarly stable. Posterior Fossa, vertebral arteries, paraspinal tissues: Negative. See brain MRI. Disc levels: No disc level pathology. No disc herniation or stenosis of the canal or foramina. No facet arthropathy. IMPRESSION: Stable examination since 10/10/2013. Abnormal T2 signal  within the cord at C2-3 and C6-7 is stable and does not show any contrast enhancement. No new lesion. Electronically Signed   By: Nelson Chimes M.D.   On: 02/26/2022 13:20    Assessment & Plan:   Devon Barron was seen today for annual exam, diabetes, anemia and hyperlipidemia.  Diagnoses and all orders for this visit:  Essential hypertension- His blood pressure is well controlled.  EKG is negative for LVH. -     Urinalysis, Routine w reflex microscopic; Future -     EKG 12-Lead -     Urinalysis, Routine w reflex microscopic  Type II diabetes mellitus with manifestations ([place])- His blood sugar is well controlled. -     Hemoglobin A1c; Future -     Microalbumin / creatinine urine ratio; Future -     HM Diabetes Foot Exam -     Hemoglobin A1c -     Microalbumin / creatinine urine ratio  Hyperlipidemia LDL goal <100- I have asked him to take a statin for cardiovascular risk reduction. -     Lipid panel; Future -     Lipid panel -     rosuvastatin (CRESTOR) 5 MG tablet; Take 1 tablet (5 mg total) by mouth daily.  Iron deficiency  anemia due to chronic blood loss- H&H has improved and iron level is normal. -     IBC + Ferritin; Future -     CBC with Differential/Platelet; Future -     IBC + Ferritin -     CBC with Differential/Platelet  Routine general medical examination at a health care facility- Exam completed, labs reviewed, vaccines reviewed and updated, cancer screenings are up-to-date, patient education was given.  Other orders -     Flu Vaccine QUAD 6+ mos PF IM (Fluarix Quad PF) -     LDL cholesterol, direct   I am having Devon Barron start on rosuvastatin. I am also having him maintain his cholecalciferol, multivitamin with minerals, ACCRUFeR, and Rebif.  Meds ordered this encounter  Medications   rosuvastatin (CRESTOR) 5 MG tablet    Sig: Take 1 tablet (5 mg total) by mouth daily.    Dispense:  90 tablet    Refill:  1     Follow-up: Return in about 6 months (around 10/16/2022).  Scarlette Calico, MD

## 2022-04-18 DIAGNOSIS — Z23 Encounter for immunization: Secondary | ICD-10-CM | POA: Diagnosis not present

## 2022-05-01 ENCOUNTER — Telehealth: Payer: Self-pay | Admitting: Internal Medicine

## 2022-05-01 DIAGNOSIS — Z0289 Encounter for other administrative examinations: Secondary | ICD-10-CM

## 2022-05-01 NOTE — Telephone Encounter (Signed)
We received through fax a Disability form from [employer]. I place in providers box up front

## 2022-05-01 NOTE — Telephone Encounter (Signed)
Form has been completed, reviewed and signed by PCP.  Faxed back.  Copy sent to pt Copy sent to charge Original filed with [place]

## 2022-05-29 ENCOUNTER — Other Ambulatory Visit: Payer: Self-pay | Admitting: Internal Medicine

## 2022-05-29 ENCOUNTER — Encounter: Payer: Self-pay | Admitting: Internal Medicine

## 2022-05-29 DIAGNOSIS — D539 Nutritional anemia, unspecified: Secondary | ICD-10-CM

## 2022-07-11 ENCOUNTER — Ambulatory Visit: Payer: BC Managed Care – PPO | Admitting: Internal Medicine

## 2022-07-11 ENCOUNTER — Encounter: Payer: Self-pay | Admitting: Internal Medicine

## 2022-07-11 VITALS — BP 152/94 | HR 84 | Temp 98.1°F | Ht 69.0 in | Wt 206.0 lb

## 2022-07-11 DIAGNOSIS — D539 Nutritional anemia, unspecified: Secondary | ICD-10-CM | POA: Diagnosis not present

## 2022-07-11 DIAGNOSIS — E118 Type 2 diabetes mellitus with unspecified complications: Secondary | ICD-10-CM | POA: Diagnosis not present

## 2022-07-11 DIAGNOSIS — D500 Iron deficiency anemia secondary to blood loss (chronic): Secondary | ICD-10-CM | POA: Diagnosis not present

## 2022-07-11 DIAGNOSIS — I10 Essential (primary) hypertension: Secondary | ICD-10-CM

## 2022-07-11 NOTE — Progress Notes (Signed)
Subjective:  Patient ID: Devon Barron, male    DOB: [birth year]  Age: 61 y.o. MRN: [id number]  CC: Anemia, Headache, and Hypertension   HPI Devon Barron presents for f/up -   He is not currently taking any antihypertensives.  He complains of cold intolerance and chronic fatigue but he is active and denies diaphoresis, chest pain, shortness of breath, or edema.  Outpatient Medications Prior to Visit  Medication Sig Dispense Refill   cholecalciferol (VITAMIN D) 1000 UNITS tablet Take 1,000 Units by mouth daily. VIT D 3 2000 UNITS TAKES 2 TABS = 4000 UNITS     Ferric Maltol (ACCRUFER) 30 MG CAPS Take 1 capsule by mouth in the morning and at bedtime. 180 capsule 1   Multiple Vitamins-Minerals (MULTIVITAMIN WITH MINERALS) tablet Take 1 tablet by mouth daily.     REBIF 44 MCG/0.5ML SOSY injection INJECT ONE SYRINGE SUBCUTANEOUSLY THREE TIMES PER WEEK. KEEP REFRIGERATED. 6 mL 5   rosuvastatin (CRESTOR) 5 MG tablet Take 1 tablet (5 mg total) by mouth daily. 90 tablet 1   No facility-administered medications prior to visit.    ROS Review of Systems  Constitutional:  Positive for fatigue. Negative for appetite change, chills, diaphoresis, fever and unexpected weight change.  HENT: Negative.    Respiratory: Negative.  Negative for cough, chest tightness, shortness of breath and wheezing.   Cardiovascular:  Negative for chest pain, palpitations and leg swelling.  Gastrointestinal:  Negative for abdominal pain, constipation, diarrhea, nausea and vomiting.  Endocrine: Positive for cold intolerance.  Genitourinary: Negative.  Negative for difficulty urinating.  Musculoskeletal: Negative.  Negative for arthralgias and myalgias.  Skin: Negative.  Negative for color change and pallor.  Allergic/Immunologic: Negative.   Neurological: Negative.   Hematological:  Negative for adenopathy. Does not bruise/bleed easily.  Psychiatric/Behavioral: Negative.      Objective:  BP (!) 152/94 (BP  Location: Right Arm, Patient Position: Sitting, Cuff Size: Large)   Pulse 84   Temp 98.1 F (36.7 C) (Oral)   Ht '5\' 9"'$  (1.753 m)   Wt 206 lb (93.4 kg)   SpO2 98%   BMI 30.42 kg/m   BP Readings from Last 3 Encounters:  07/11/22 (!) 152/94  04/17/22 136/86  03/04/22 135/85    Wt Readings from Last 3 Encounters:  07/11/22 206 lb (93.4 kg)  04/17/22 208 lb (94.3 kg)  03/04/22 208 lb (94.3 kg)    Physical Exam Vitals reviewed.  HENT:     Mouth/Throat:     Mouth: Mucous membranes are moist.  Eyes:     General: No scleral icterus.    Conjunctiva/sclera: Conjunctivae normal.  Cardiovascular:     Rate and Rhythm: Normal rate and regular rhythm.     Heart sounds: No murmur heard. Pulmonary:     Effort: Pulmonary effort is normal.     Breath sounds: No stridor. No wheezing, rhonchi or rales.  Abdominal:     General: Abdomen is flat.     Palpations: There is no mass.     Tenderness: There is no abdominal tenderness. There is no guarding or rebound.     Hernia: No hernia is present.  Musculoskeletal:        General: Normal range of motion.     Cervical back: Neck supple.     Right lower leg: No edema.     Left lower leg: No edema.  Lymphadenopathy:     Cervical: No cervical adenopathy.  Skin:    General: Skin is  warm and dry.  Neurological:     General: No focal deficit present.     Mental Status: He is alert.  Psychiatric:        Mood and Affect: Mood normal.        Behavior: Behavior normal.     Lab Results  Component Value Date   WBC 4.2 07/11/2022   HGB 12.2 (L) 07/11/2022   HCT 36.8 (L) 07/11/2022   PLT 211.0 07/11/2022   GLUCOSE 85 07/11/2022   CHOL 190 04/17/2022   TRIG 217.0 (H) 04/17/2022   HDL 48.50 04/17/2022   LDLDIRECT 102.0 04/17/2022   LDLCALC 114 (H) 10/05/2020   ALT 22 03/04/2022   AST 21 03/04/2022   NA 141 07/11/2022   K 4.6 07/11/2022   CL 105 07/11/2022   CREATININE 0.92 07/11/2022   BUN 11 07/11/2022   CO2 30 07/11/2022   TSH  1.20 07/11/2022   PSA 0.0 11/13/2021   INR 1.0 12/04/2020   HGBA1C 6.6 (H) 07/11/2022   MICROALBUR 3.8 (H) 04/17/2022    MR BRAIN W WO CONTRAST  Result Date: 02/26/2022 CLINICAL DATA:  Follow-up multiple sclerosis EXAM: MRI HEAD WITHOUT AND WITH CONTRAST TECHNIQUE: Multiplanar, multiecho pulse sequences of the brain and surrounding structures were obtained without and with intravenous contrast. CONTRAST:  51m MULTIHANCE GADOBENATE DIMEGLUMINE 529 MG/ML IV SOLN COMPARISON:  11/18/2020 FINDINGS: Brain: Diffusion imaging does not show any acute infarction. Few areas of T2 shine through within the deep white matter disease are unchanged since the prior study. Small foci of abnormal T2 signal within the pons, middle cerebral peduncle on the left and cerebellum are stable. Foci of abnormal T2 and FLAIR signal affecting the white matter, sub cortical less than D are stable. Some cortical involvement particularly notable at the frontoparietal vertices is stable. No new or progressive lesions. No lesions show abnormal contrast enhancement. Vascular: Major vessels at the base of the brain show flow. Skull and upper cervical spine: Negative Sinuses/Orbits: Clear/normal Other: None IMPRESSION: Stable findings of multiple sclerosis affecting the brainstem, cerebellum and cerebral hemispheres as above. No new or progressive lesions. No lesions show true restricted diffusion or contrast enhancement. Few areas of T2 shine through are stable. Electronically Signed   By: MNelson ChimesM.D.   On: 02/26/2022 13:24   MR CERVICAL SPINE W WO CONTRAST  Result Date: 02/26/2022 CLINICAL DATA:  Multiple sclerosis.  Follow-up diagnosis 2021 EXAM: MRI CERVICAL SPINE WITHOUT AND WITH CONTRAST TECHNIQUE: Multiplanar and multiecho pulse sequences of the cervical spine, to include the craniocervical junction and cervicothoracic junction, were obtained without and with intravenous contrast. CONTRAST:  214mMULTIHANCE GADOBENATE  DIMEGLUMINE 529 MG/ML IV SOLN COMPARISON:  MRI 10/10/2013 FINDINGS: Alignment: Normal Vertebrae: Normal Cord: Stable examination since 2015. Focal T2 signal within the dorsal midline cord at the C2-3 level has not progressed and does not show any contrast enhancement. Tiny focus of signal within the cord to the right of midline at the C6-7 level is similarly stable. Posterior Fossa, vertebral arteries, paraspinal tissues: Negative. See brain MRI. Disc levels: No disc level pathology. No disc herniation or stenosis of the canal or foramina. No facet arthropathy. IMPRESSION: Stable examination since 10/10/2013. Abnormal T2 signal within the cord at C2-3 and C6-7 is stable and does not show any contrast enhancement. No new lesion. Electronically Signed   By: MaNelson Chimes.D.   On: 02/26/2022 13:20    Assessment & Plan:   AlKarsonas seen today for anemia, headache and  hypertension.  Diagnoses and all orders for this visit:  Iron deficiency anemia due to chronic blood loss- His H&H are stable.  His iron level is normal. -     IBC + Ferritin; Future -     CBC with Differential/Platelet; Future -     CBC with Differential/Platelet -     IBC + Ferritin  Deficiency anemia- Vitamin levels are normal.  This is likely the anemia of chronic disease. -     IBC + Ferritin; Future -     Reticulocytes; Future -     CBC with Differential/Platelet; Future -     Folate; Future -     Vitamin B1; Future -     Zinc; Future -     Zinc -     Vitamin B1 -     Folate -     CBC with Differential/Platelet -     Reticulocytes -     IBC + Ferritin  Type II diabetes mellitus with manifestations ([place])- His blood sugar is adequately well-controlled. -     Hemoglobin A1c; Future -     Hemoglobin A1c  Essential hypertension- Blood pressure is not adequately well-controlled.  Will restart the thiazide diuretic. -     TSH; Future -     Basic metabolic panel; Future -     Basic metabolic panel -     TSH -      indapamide (LOZOL) 1.25 MG tablet; Take 1 tablet (1.25 mg total) by mouth daily.   I am having Devon Barron start on indapamide. I am also having him maintain his cholecalciferol, multivitamin with minerals, ACCRUFeR, Rebif, and rosuvastatin.  Meds ordered this encounter  Medications   indapamide (LOZOL) 1.25 MG tablet    Sig: Take 1 tablet (1.25 mg total) by mouth daily.    Dispense:  90 tablet    Refill:  0     Follow-up: Return in about 3 months (around 10/10/2022).  Scarlette Calico, MD

## 2022-07-11 NOTE — Patient Instructions (Signed)

## 2022-07-12 LAB — IBC + FERRITIN
Ferritin: 741.5 ng/mL — ABNORMAL HIGH (ref 22.0–322.0)
Iron: 57 ug/dL (ref 42–165)
Saturation Ratios: 19.2 % — ABNORMAL LOW (ref 20.0–50.0)
TIBC: 296.8 ug/dL (ref 250.0–450.0)
Transferrin: 212 mg/dL (ref 212.0–360.0)

## 2022-07-12 LAB — CBC WITH DIFFERENTIAL/PLATELET
Basophils Absolute: 0.1 10*3/uL (ref 0.0–0.1)
Basophils Relative: 1.4 % (ref 0.0–3.0)
Eosinophils Absolute: 0.1 10*3/uL (ref 0.0–0.7)
Eosinophils Relative: 2.1 % (ref 0.0–5.0)
HCT: 36.8 % — ABNORMAL LOW (ref 39.0–52.0)
Hemoglobin: 12.2 g/dL — ABNORMAL LOW (ref 13.0–17.0)
Lymphocytes Relative: 21.9 % (ref 12.0–46.0)
Lymphs Abs: 0.9 10*3/uL (ref 0.7–4.0)
MCHC: 33.2 g/dL (ref 30.0–36.0)
MCV: 86.4 fl (ref 78.0–100.0)
Monocytes Absolute: 0.7 10*3/uL (ref 0.1–1.0)
Monocytes Relative: 15.9 % — ABNORMAL HIGH (ref 3.0–12.0)
Neutro Abs: 2.5 10*3/uL (ref 1.4–7.7)
Neutrophils Relative %: 58.7 % (ref 43.0–77.0)
Platelets: 211 10*3/uL (ref 150.0–400.0)
RBC: 4.26 Mil/uL (ref 4.22–5.81)
RDW: 14.6 % (ref 11.5–15.5)
WBC: 4.2 10*3/uL (ref 4.0–10.5)

## 2022-07-12 LAB — BASIC METABOLIC PANEL
BUN: 11 mg/dL (ref 6–23)
CO2: 30 mEq/L (ref 19–32)
Calcium: 9.8 mg/dL (ref 8.4–10.5)
Chloride: 105 mEq/L (ref 96–112)
Creatinine, Ser: 0.92 mg/dL (ref 0.40–1.50)
GFR: 89.88 mL/min (ref >60.00)
Glucose, Bld: 85 mg/dL (ref 70–99)
Potassium: 4.6 mEq/L (ref 3.5–5.1)
Sodium: 141 mEq/L (ref 135–145)

## 2022-07-12 LAB — HEMOGLOBIN A1C: Hgb A1c MFr Bld: 6.6 % — ABNORMAL HIGH (ref 4.6–6.5)

## 2022-07-12 LAB — FOLATE: Folate: 12.6 ng/mL (ref >5.9)

## 2022-07-12 LAB — TSH: TSH: 1.2 u[IU]/mL (ref 0.35–5.50)

## 2022-07-17 LAB — RETICULOCYTES
ABS Retic: 38700 cells/uL (ref 25000–90000)
Retic Ct Pct: 0.9 %

## 2022-07-17 LAB — ZINC: Zinc: 63 ug/dL (ref 60–130)

## 2022-07-17 LAB — VITAMIN B1: Vitamin B1 (Thiamine): 14 nmol/L (ref 8–30)

## 2022-07-19 MED ORDER — INDAPAMIDE 1.25 MG PO TABS: 1.2500 mg | ORAL_TABLET | Freq: Every day | ORAL | 0 refills | Status: DC

## 2022-07-23 ENCOUNTER — Telehealth: Payer: Self-pay | Admitting: Internal Medicine

## 2022-07-23 NOTE — Telephone Encounter (Signed)
Danielle a nurse called wanting an update on a form she faxed over to be completed and faxed back. Form is in patients chart under the media tab, it was sent on 07/19/22. Form can be faxed back at [phone]. Andee Poles can be reached at [phone].

## 2022-07-24 ENCOUNTER — Ambulatory Visit: Payer: BC Managed Care – PPO | Admitting: Internal Medicine

## 2022-08-06 ENCOUNTER — Encounter: Payer: Self-pay | Admitting: Neurology

## 2022-08-06 ENCOUNTER — Encounter: Payer: Self-pay | Admitting: Internal Medicine

## 2022-08-07 ENCOUNTER — Other Ambulatory Visit: Payer: Self-pay | Admitting: Internal Medicine

## 2022-08-07 ENCOUNTER — Telehealth: Payer: Self-pay | Admitting: Internal Medicine

## 2022-08-07 NOTE — Telephone Encounter (Signed)
Quillian Quince, a home health nurse called for clarification on the pt's medication.   Pt states we gave him an RX for Ferric Maltol (ACCRUFER) but this hasn't been filled since 2022. Pt and nurse are unsure if he should be taking this medication or not.   Please call Quillian Quince to clarify: [phone]

## 2022-08-08 ENCOUNTER — Telehealth: Payer: Self-pay | Admitting: Internal Medicine

## 2022-08-08 NOTE — Telephone Encounter (Signed)
LVM for Danielle stating that pt can d/c Accrufer. Med is no longer needed.

## 2022-08-08 NOTE — Telephone Encounter (Signed)
Devon Barron called from [facility] stating that the patient is wanting to be put back on an iron pill called Accrufer. Patient wants this prescription to be called into his pharmacy on file. Patient would like a callback at [phone].

## 2022-08-08 NOTE — Telephone Encounter (Signed)
Danielle was informed via VM that Rx is no longer needed and pt is to d/c use. See phone note dated 1/3.

## 2022-08-20 NOTE — Telephone Encounter (Signed)
Pt is requesting a call from Dr. Ronnald Ramp nurse to explain why we will not refill his accrufer rx. Informed Danielle at [facility] that pt does not need this RX but PT is requesting an explanation.   Please call pt at:  [phone]

## 2022-08-20 NOTE — Telephone Encounter (Signed)
Pt has been informed and expressed understanding.  

## 2022-08-26 ENCOUNTER — Encounter: Payer: Self-pay | Admitting: Neurology

## 2022-08-26 ENCOUNTER — Other Ambulatory Visit: Payer: Self-pay

## 2022-08-26 DIAGNOSIS — G35 Multiple sclerosis: Secondary | ICD-10-CM

## 2022-08-26 NOTE — Progress Notes (Signed)
Lab ordered. Per Dr.Jaffe CMP due week before patient f/u  visit.

## 2022-09-03 NOTE — Progress Notes (Unsigned)
NEUROLOGY FOLLOW UP OFFICE NOTE  [name] [id number]  Assessment/Plan:   Multiple sclerosis    DMT:  Rebif D3 4000 IU daily  Check hepatic panel and vit D Follow up 6 months.   Subjective:  Devon Barron is a 62 year old right-handed man who follows up for multiple sclerosis   UPDATE: Current disease modifying therapy: Rebif Other current medications: D3 4000 IU daily     Lab from 07/11/2022:  CBC with diff with WBC 4.2, HGB 12.2, HCT 36.8, MCV 86.4, PLT 211.     Vision: No issues Motor: No issues Sensory: No issues Pain: Lower abdominal pain being worked up by PCP Gait: No issues Bowel/Bladder: No issues Fatigue: yesterday he felt fatigued but now okay. Cognition: No issues Mood: Denies depression or anxiety   HISTORY: He was diagnosed with MS in 2001.  At that time, he presented with left facial myokymia. He reports that he hasn't had many flare ups, and only a couple of flare-ups requiring pulse steroids.  They typically present as blurred vision, headache, dizziness, unsteadiness and fatigue.  He has both supratentorial and infratentorial demyelinating lesions.  His cervical cord is okay.  He has a history of lapses with physician follow-ups and non-adherence to disease-modifying agents, mostly due to financial reasons.  He previously was on Avonex, and then Rebif.  Due to financial reasons, he has not followed up with a neurologist in between 2013 and 2014 and had not taken his Rebif.  He developed increased fatigue, stiffness, blurred vision and intermittent headaches, similar to prior flare ups.  He presented to the ED on 05/03/13.  Physical exam revealed slight right sided facial droop.  Initially, a CT of the head was performed and revealed decreased attenuation at the right subfrontal-anterior temporal junction.  MRI Brain w/wo contrast was performed, and revealed extensive periventricular and subcortical T2 hyperintensities, as well as focal restricted diffusion  within the splenium of the corpus callosum, concerning for active demyelination.  He has since restarted Rebif.   MRI of brain with and without contrast from 11/03/16 was stable compared to prior MRIs from 02/14/15, 03/08/14 and 07/13/13.  MRI of brain with and without contrast from 12/02/19 showed mildly progressed multifocal posterior frontal and parietal lobe cortical involvement appears mildly progressed since 2018.  Evidence of chronic plaque in dorsal upper spinal cord at C2.   MRI of brain with and without contrast on 11/20/2020 stable compared to prior imaging from 12/02/2019.  MRI of brain and cervical spine with and without contrast on 02/26/2022 were stable compared to imaging on 11/18/2020.   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  PAST MEDICAL HISTORY: Past Medical History:  Diagnosis Date   DM type 2 (diabetes mellitus, type 2) ([place])    Hyperlipidemia    MS (multiple sclerosis) ([place])    Prostate cancer ([place])     MEDICATIONS: Current Outpatient Medications on File Prior to Visit  Medication Sig Dispense Refill   cholecalciferol (VITAMIN D) 1000 UNITS tablet Take 1,000 Units by mouth daily. VIT D 3 2000 UNITS TAKES 2 TABS = 4000 UNITS     indapamide (LOZOL) 1.25 MG tablet Take 1 tablet (1.25 mg total) by mouth daily. 90 tablet 0   Multiple Vitamins-Minerals (MULTIVITAMIN WITH MINERALS) tablet Take 1 tablet by mouth daily.     REBIF 44 MCG/0.5ML SOSY injection INJECT ONE SYRINGE SUBCUTANEOUSLY THREE TIMES PER WEEK. KEEP REFRIGERATED. 6 mL 5   rosuvastatin (CRESTOR) 5  MG tablet Take 1 tablet (5 mg total) by mouth daily. 90 tablet 1   No current facility-administered medications on file prior to visit.    ALLERGIES: No Known Allergies  FAMILY HISTORY: Family History  Problem Relation Age of Onset   Hypertension Mother    Ovarian cancer Mother    Hypertension Father    Prostate cancer Father    Diabetes Sister    Diabetes Brother    Ataxia Neg Hx     Chorea Neg Hx    Dementia Neg Hx    Mental retardation Neg Hx    Migraines Neg Hx    Multiple sclerosis Neg Hx    Neurofibromatosis Neg Hx    Neuropathy Neg Hx    Parkinsonism Neg Hx    Seizures Neg Hx    Stroke Neg Hx    Early death Neg Hx    Cancer Neg Hx    Alcohol abuse Neg Hx    Hearing loss Neg Hx    Heart disease Neg Hx    Hyperlipidemia Neg Hx    Kidney disease Neg Hx    Breast cancer Neg Hx    Colon cancer Neg Hx    Pancreatic cancer Neg Hx    Rectal cancer Neg Hx    Stomach cancer Neg Hx    Esophageal cancer Neg Hx       Objective:  Blood pressure 138/87, pulse 88. General: No acute distress.  Patient appears well-groomed.   Head:  Normocephalic/atraumatic Eyes:  Fundi examined but not visualized Neck: supple, no paraspinal tenderness, full range of motion Heart:  Regular rate and rhythm Neurological Exam: alert and oriented to person, place, and time.  Speech fluent and not dysarthric, language intact.  CN II-XII intact. Bulk and tone normal, muscle strength 5/5 throughout.  Sensation to light touch intact.  Deep tendon reflexes 2+ throughout.  Finger to nose testing intact.  Gait normal, Romberg negative.   Metta Clines, DO  CC: Scarlette Calico, MD

## 2022-09-04 ENCOUNTER — Encounter: Payer: Self-pay | Admitting: Neurology

## 2022-09-04 ENCOUNTER — Ambulatory Visit (INDEPENDENT_AMBULATORY_CARE_PROVIDER_SITE_OTHER): Payer: BC Managed Care – PPO | Admitting: Neurology

## 2022-09-04 ENCOUNTER — Other Ambulatory Visit (INDEPENDENT_AMBULATORY_CARE_PROVIDER_SITE_OTHER): Payer: BC Managed Care – PPO

## 2022-09-04 VITALS — BP 138/87 | HR 88

## 2022-09-04 DIAGNOSIS — G35 Multiple sclerosis: Secondary | ICD-10-CM

## 2022-09-04 LAB — COMPREHENSIVE METABOLIC PANEL
ALT: 24 U/L (ref 0–53)
AST: 23 U/L (ref 0–37)
Albumin: 4.2 g/dL (ref 3.5–5.2)
Alkaline Phosphatase: 64 U/L (ref 39–117)
BUN: 14 mg/dL (ref 6–23)
CO2: 28 mEq/L (ref 19–32)
Calcium: 9.6 mg/dL (ref 8.4–10.5)
Chloride: 103 mEq/L (ref 96–112)
Creatinine, Ser: 0.97 mg/dL (ref 0.40–1.50)
GFR: 84.27 mL/min (ref >60.00)
Glucose, Bld: 117 mg/dL — ABNORMAL HIGH (ref 70–99)
Potassium: 3.8 mEq/L (ref 3.5–5.1)
Sodium: 137 mEq/L (ref 135–145)
Total Bilirubin: 0.3 mg/dL (ref 0.2–1.2)
Total Protein: 7.8 g/dL (ref 6.0–8.3)

## 2022-09-04 LAB — HEPATIC FUNCTION PANEL
ALT: 24 U/L (ref 0–53)
AST: 23 U/L (ref 0–37)
Albumin: 4.2 g/dL (ref 3.5–5.2)
Alkaline Phosphatase: 64 U/L (ref 39–117)
Bilirubin, Direct: 0.1 mg/dL (ref 0.0–0.3)
Total Bilirubin: 0.3 mg/dL (ref 0.2–1.2)
Total Protein: 7.8 g/dL (ref 6.0–8.3)

## 2022-09-04 LAB — VITAMIN D 25 HYDROXY (VIT D DEFICIENCY, FRACTURES): VITD: 49.54 ng/mL (ref 30.00–100.00)

## 2022-09-04 NOTE — Patient Instructions (Addendum)
Continue Rebif and D3 4000 IU daily Check liver function test and vit D level  Your provider has requested that you have labwork completed today. Please go to [facility] ([id number]) on the second floor of this building before leaving the office today. You do not need to check in. If you are not called within 15 minutes please check with the front desk.

## 2022-09-05 ENCOUNTER — Encounter: Payer: Self-pay | Admitting: Neurology

## 2022-09-16 ENCOUNTER — Encounter: Payer: Self-pay | Admitting: Neurology

## 2022-09-27 ENCOUNTER — Telehealth: Payer: Self-pay | Admitting: Pharmacy Technician

## 2022-09-27 NOTE — Telephone Encounter (Signed)
Submitted a Prior Authorization request to [facility] for  Rebif  via Fax. Will update once we receive a response.  PA# [id number] Phone# [phone] Fax# [phone]

## 2022-10-07 LAB — HM DIABETES EYE EXAM

## 2022-10-11 ENCOUNTER — Encounter: Payer: Self-pay | Admitting: Internal Medicine

## 2022-10-18 ENCOUNTER — Other Ambulatory Visit (HOSPITAL_COMMUNITY): Payer: Self-pay

## 2022-10-18 NOTE — Telephone Encounter (Signed)
Ran test claim- PA approved through 09/28/2023. Pharmacy last filled 10/08/22

## 2022-10-22 ENCOUNTER — Other Ambulatory Visit: Payer: Self-pay | Admitting: Neurology

## 2022-10-24 ENCOUNTER — Other Ambulatory Visit: Payer: Self-pay | Admitting: Neurology

## 2022-11-20 IMAGING — CR DG CHEST 2V
2 series · 2 of 2 positions shown · non-contrast
Comparison: Chest x-ray 07/23/2013.

CLINICAL DATA: 59-year-old male under preoperative evaluation.

EXAM:
CHEST - 2 VIEW

[w chest pa]
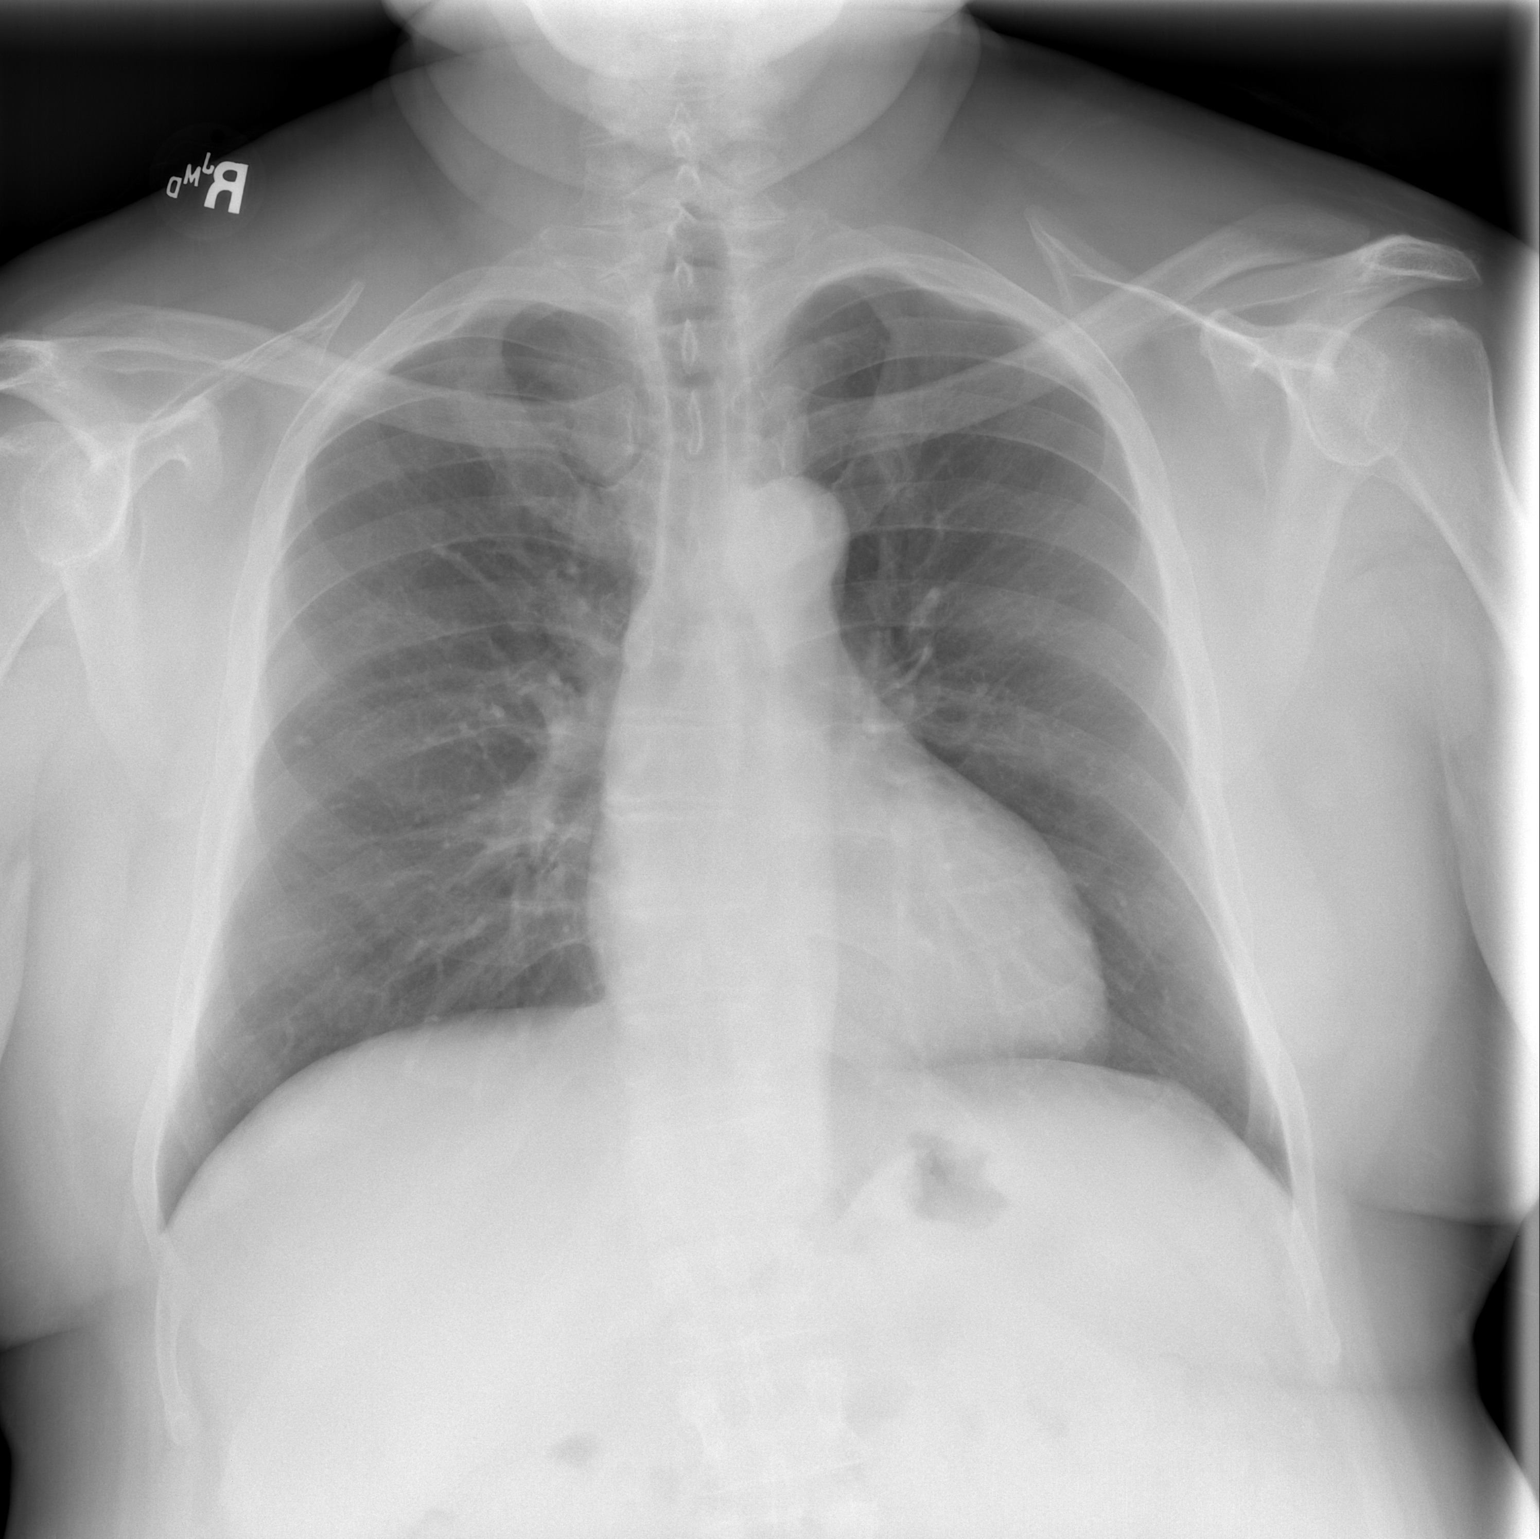

[w chest lat]
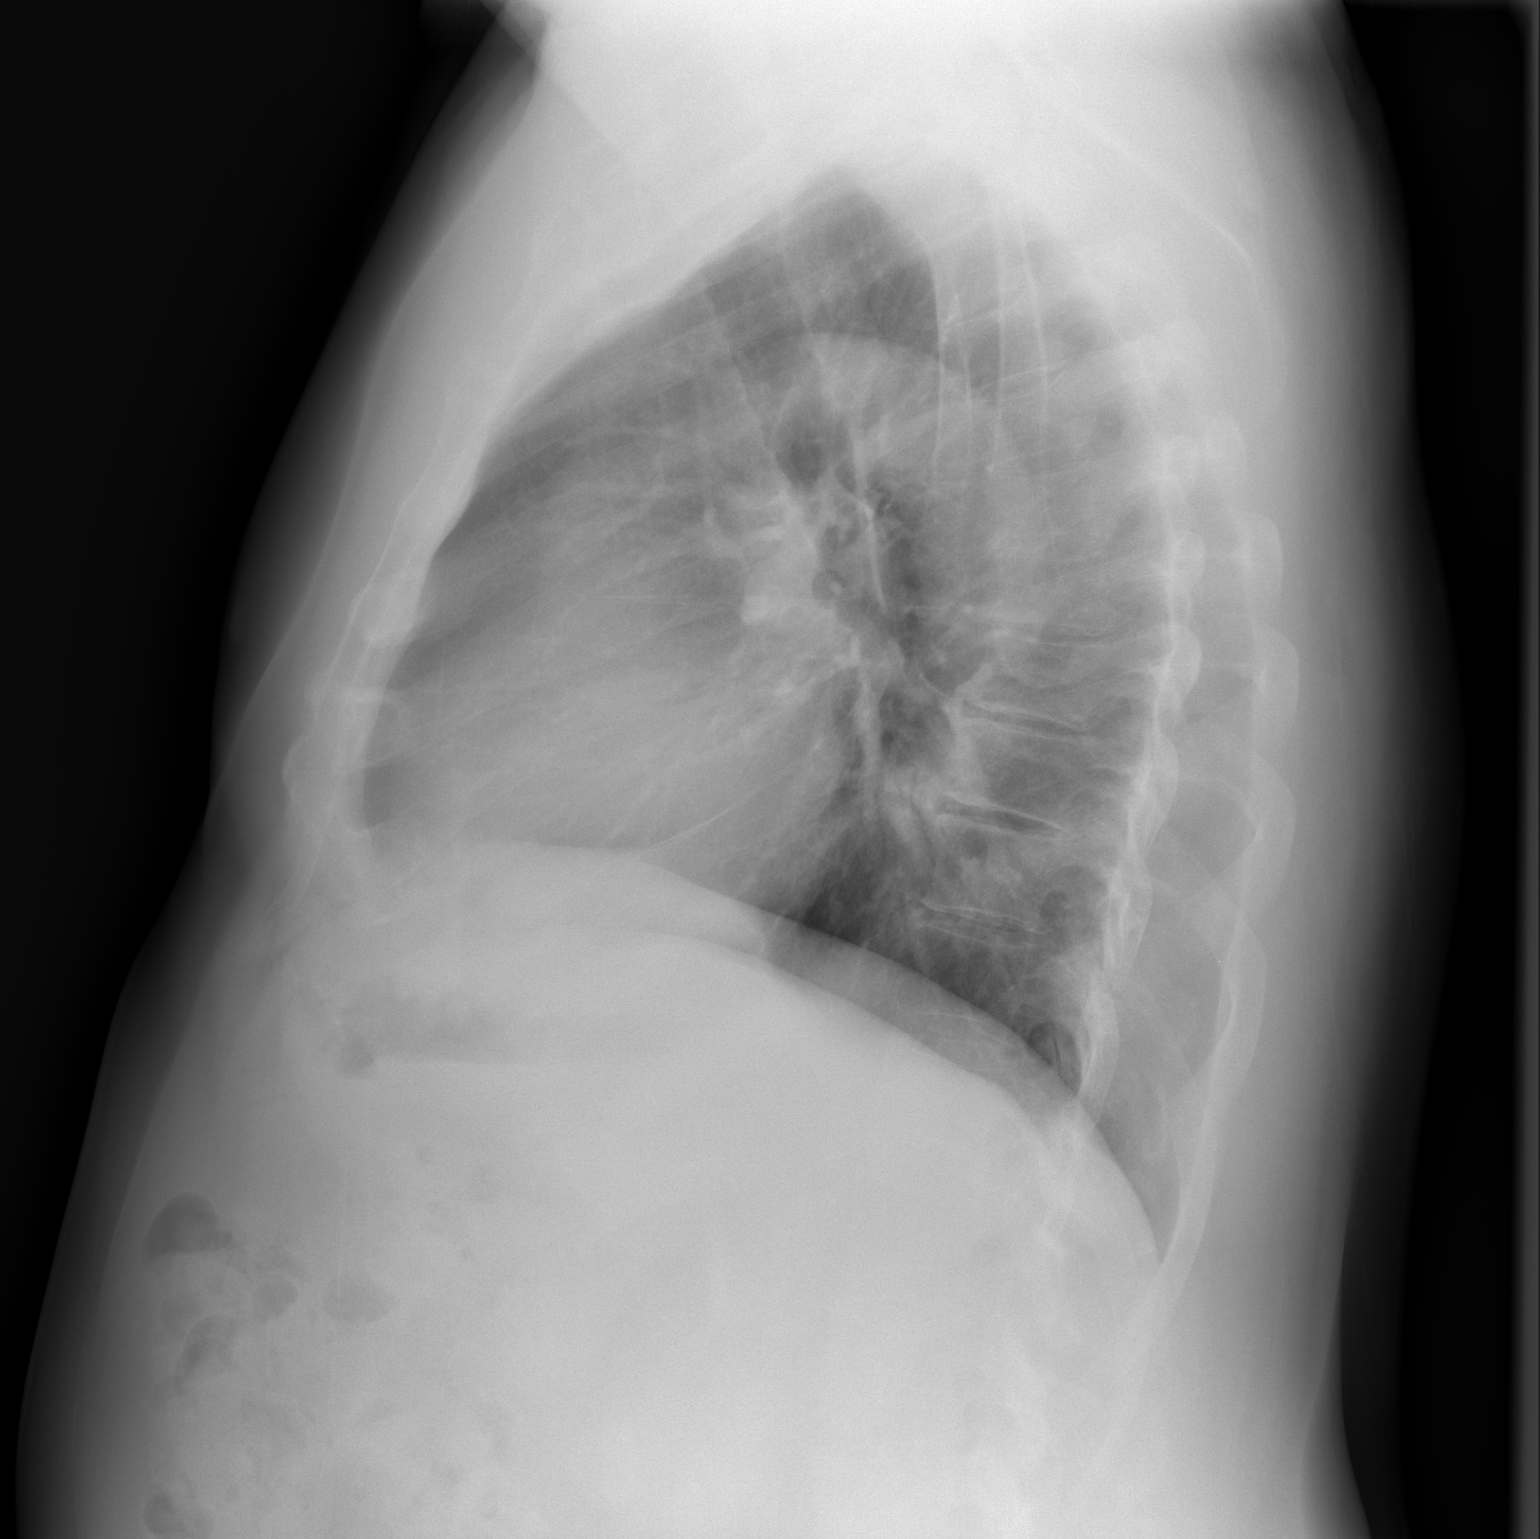

[2 of 2 positions shown; findings below may reference images not displayed]

FINDINGS: Lung volumes are normal. No consolidative airspace disease. No
pleural effusions. No pneumothorax. Tiny dense pulmonary nodule in
the right mid lung, stable compared to the prior examination, most
compatible with a small calcified granuloma. No other suspicious
appearing pulmonary nodule or mass noted. Pulmonary vasculature and
the cardiomediastinal silhouette are within normal limits.
IMPRESSION: 1. No radiographic evidence of acute cardiopulmonary disease.

## 2023-02-27 NOTE — Progress Notes (Signed)
NEUROLOGY FOLLOW UP OFFICE NOTE  Devon Barron [id number]  Assessment/Plan:   Multiple sclerosis    DMT:  Rebif D3 4000 IU daily  Check CBC with diff, CMP Follow up 6 months.   Subjective:  Devon Barron is a 62 year old right-handed man who follows up for multiple sclerosis   UPDATE: Current disease modifying therapy: Rebif Other current medications: D3 4000 IU daily     Lab from 09/04/2022:  Hepatic function panel with T bili 0.3, ALP 64, AST 23, ALT 24; Vit D 49.54     Vision: No issues Motor: No issues Sensory: No issues Pain: Lower abdominal pain being worked up by PCP Gait: No issues Bowel/Bladder: No issues Fatigue: yesterday he felt fatigued but now okay. Cognition: No issues Mood: Denies depression or anxiety   HISTORY: He was diagnosed with MS in 2001.  At that time, he presented with left facial myokymia. He reports that he hasn't had many flare ups, and only a couple of flare-ups requiring pulse steroids.  They typically present as blurred vision, headache, dizziness, unsteadiness and fatigue.  He has both supratentorial and infratentorial demyelinating lesions.  His cervical cord is okay.  He has a history of lapses with physician follow-ups and non-adherence to disease-modifying agents, mostly due to financial reasons.  He previously was on Avonex, and then Rebif.  Due to financial reasons, he has not followed up with a neurologist in between 2013 and 2014 and had not taken his Rebif.  He developed increased fatigue, stiffness, blurred vision and intermittent headaches, similar to prior flare ups.  He presented to the ED on 05/03/13.  Physical exam revealed slight right sided facial droop.  Initially, a CT of the head was performed and revealed decreased attenuation at the right subfrontal-anterior temporal junction.  MRI Brain w/wo contrast was performed, and revealed extensive periventricular and subcortical T2 hyperintensities, as well as focal restricted  diffusion within the splenium of the corpus callosum, concerning for active demyelination.  He has since restarted Rebif.   MRI of brain with and without contrast from 11/03/16 was stable compared to prior MRIs from 02/14/15, 03/08/14 and 07/13/13.  MRI of brain with and without contrast from 12/02/19 showed mildly progressed multifocal posterior frontal and parietal lobe cortical involvement appears mildly progressed since 2018.  Evidence of chronic plaque in dorsal upper spinal cord at C2.   MRI of brain with and without contrast on 11/20/2020 stable compared to prior imaging from 12/02/2019.  MRI of brain and cervical spine with and without contrast on 02/26/2022 were stable compared to imaging on 11/18/2020.   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  PAST MEDICAL HISTORY: Past Medical History:  Diagnosis Date   DM type 2 (diabetes mellitus, type 2) ([facility])    Hyperlipidemia    MS (multiple sclerosis) ([facility])    Prostate cancer ([facility])     MEDICATIONS: Current Outpatient Medications on File Prior to Visit  Medication Sig Dispense Refill   cholecalciferol (VITAMIN D) 1000 UNITS tablet Take 1,000 Units by mouth daily. VIT D 3 2000 UNITS TAKES 2 TABS = 4000 UNITS     GEMTESA 75 MG TABS Take 1 tablet by mouth daily.     interferon beta-1a (REBIF) 44 MCG/0.5ML SOSY injection INJECT ONE SYRINGE SUBCUTANEOUSLY THREE TIMES PER WEEK. KEEP REFRIGERATED. 12 mL 6   Multiple Vitamins-Minerals (MULTIVITAMIN WITH MINERALS) tablet Take 1 tablet by mouth daily.     rosuvastatin (CRESTOR) 5 MG tablet  Take 1 tablet (5 mg total) by mouth daily. 90 tablet 1   tamsulosin (FLOMAX) 0.4 MG CAPS capsule Take 0.4 mg by mouth daily.     No current facility-administered medications on file prior to visit.    ALLERGIES: No Known Allergies  FAMILY HISTORY: Family History  Problem Relation Age of Onset   Hypertension Mother    Ovarian cancer Mother    Hypertension Father    Prostate cancer Father     Diabetes Sister    Diabetes Brother    Ataxia Neg Hx    Chorea Neg Hx    Dementia Neg Hx    Mental retardation Neg Hx    Migraines Neg Hx    Multiple sclerosis Neg Hx    Neurofibromatosis Neg Hx    Neuropathy Neg Hx    Parkinsonism Neg Hx    Seizures Neg Hx    Stroke Neg Hx    Early death Neg Hx    Cancer Neg Hx    Alcohol abuse Neg Hx    Hearing loss Neg Hx    Heart disease Neg Hx    Hyperlipidemia Neg Hx    Kidney disease Neg Hx    Breast cancer Neg Hx    Colon cancer Neg Hx    Pancreatic cancer Neg Hx    Rectal cancer Neg Hx    Stomach cancer Neg Hx    Esophageal cancer Neg Hx       Objective:  Blood pressure (!) 140/87, pulse 95, height 5\' 6"  (1.676 m), weight 217 lb (98.4 kg), SpO2 98%. General: No acute distress.  Patient appears well-groomed.   Head:  Normocephalic/atraumatic Eyes:  Fundi examined but not visualized Neck: supple, no paraspinal tenderness, full range of motion Heart:  Regular rate and rhythm Neurological Exam: alert and oriented.  Speech fluent and not dysarthric, language intact.  CN II-XII intact. Bulk and tone normal, muscle strength 5/5 throughout.  Sensation to light touch intact.  Deep tendon reflexes 2+ throughout.  Finger to nose testing intact.  Gait normal, Romberg negative.   Shon Millet, DO  CC: Sanda Linger, MD

## 2023-03-03 ENCOUNTER — Ambulatory Visit (INDEPENDENT_AMBULATORY_CARE_PROVIDER_SITE_OTHER): Payer: BC Managed Care – PPO | Admitting: Neurology

## 2023-03-03 ENCOUNTER — Other Ambulatory Visit (INDEPENDENT_AMBULATORY_CARE_PROVIDER_SITE_OTHER): Payer: BC Managed Care – PPO

## 2023-03-03 ENCOUNTER — Encounter: Payer: Self-pay | Admitting: Neurology

## 2023-03-03 VITALS — BP 140/87 | HR 95 | Ht 66.0 in | Wt 217.0 lb

## 2023-03-03 DIAGNOSIS — G35 Multiple sclerosis: Secondary | ICD-10-CM | POA: Diagnosis not present

## 2023-03-03 MED ORDER — REBIF 44 MCG/0.5ML ~~LOC~~ SOSY: PREFILLED_SYRINGE | SUBCUTANEOUS | 6 refills | Status: DC

## 2023-03-03 NOTE — Patient Instructions (Signed)
Rebif D3 4000 international units  daily Check CBC with diff and CMP

## 2023-04-15 ENCOUNTER — Encounter: Payer: Self-pay | Admitting: Neurology

## 2023-06-24 ENCOUNTER — Emergency Department (HOSPITAL_BASED_OUTPATIENT_CLINIC_OR_DEPARTMENT_OTHER)
Admission: EM | Admit: 2023-06-24 | Discharge: 2023-06-24 | Disposition: A | Discharge status: Home / Self Care | Payer: BC Managed Care – PPO | Attending: Emergency Medicine | Admitting: Emergency Medicine

## 2023-06-24 ENCOUNTER — Other Ambulatory Visit: Payer: Self-pay

## 2023-06-24 ENCOUNTER — Emergency Department (HOSPITAL_BASED_OUTPATIENT_CLINIC_OR_DEPARTMENT_OTHER): Payer: BC Managed Care – PPO

## 2023-06-24 DIAGNOSIS — H538 Other visual disturbances: Secondary | ICD-10-CM | POA: Diagnosis present

## 2023-06-24 DIAGNOSIS — H539 Unspecified visual disturbance: Secondary | ICD-10-CM

## 2023-06-24 LAB — CBC
HCT: 42.5 % (ref 39.0–52.0)
Hemoglobin: 14 g/dL (ref 13.0–17.0)
MCH: 28.7 pg (ref 26.0–34.0)
MCHC: 32.9 g/dL (ref 30.0–36.0)
MCV: 87.3 fL (ref 80.0–100.0)
Platelets: 229 10*3/uL (ref 150–400)
RBC: 4.87 MIL/uL (ref 4.22–5.81)
RDW: 13.7 % (ref 11.5–15.5)
WBC: 6.4 10*3/uL (ref 4.0–10.5)
nRBC: 0 % (ref 0.0–0.2)

## 2023-06-24 LAB — BASIC METABOLIC PANEL
Anion gap: 8 (ref 5–15)
BUN: 10 mg/dL (ref 8–23)
CO2: 24 mmol/L (ref 22–32)
Calcium: 9.2 mg/dL (ref 8.9–10.3)
Chloride: 104 mmol/L (ref 98–111)
Creatinine, Ser: 0.84 mg/dL (ref 0.61–1.24)
GFR, Estimated: >60 mL/min (ref >60)
Glucose, Bld: 83 mg/dL (ref 70–99)
Potassium: 4 mmol/L (ref 3.5–5.1)
Sodium: 136 mmol/L (ref 135–145)

## 2023-06-24 LAB — TROPONIN I (HIGH SENSITIVITY): Troponin I (High Sensitivity): 6 ng/L (ref <18)

## 2023-06-24 NOTE — ED Notes (Signed)
Pt alert and oriented X 4 at the time of discharge. RR even and unlabored. No acute distress noted. Pt verbalized understanding of discharge instructions as discussed. Pt ambulatory to lobby at time of discharge.

## 2023-06-24 NOTE — ED Provider Notes (Signed)
[place] [facility] Provider Note   CSN: [id number] Arrival date & time: 06/24/23  1313     History Chief Complaint  Patient presents with   Multiple complaints     HPI Devon Barron is a 62 y.o. male presenting for multiple symptoms. He is endorsing intermittent left-sided neck pain, intermittent left eye visual blurriness, intermittent dizziness and lightheadedness as well as intermittent headaches. He denies fevers chills nausea vomiting shortness of breath. States that he has a history of multiple sclerosis and has told his neurologist with the symptoms.  He has been following with his ophthalmologist who has recently performed laser eye surgery after developing cataract per patient's description. He states most symptoms are improving but he drives for living and when he is having an episode of symptoms like this he needs to take a couple days off and his employer made him come get formal documentation that he had been seen.   Patient's recorded medical, surgical, social, medication list and allergies were reviewed in the Snapshot window as part of the initial history.   Review of Systems   Review of Systems  Constitutional:  Negative for chills and fever.  HENT:  Negative for ear pain and sore throat.   Eyes:  Positive for visual disturbance. Negative for pain.  Respiratory:  Negative for cough and shortness of breath.   Cardiovascular:  Negative for chest pain and palpitations.  Gastrointestinal:  Negative for abdominal pain and vomiting.  Genitourinary:  Negative for dysuria and hematuria.  Musculoskeletal:  Positive for neck pain. Negative for arthralgias and back pain.  Skin:  Negative for color change and rash.  Neurological:  Negative for seizures and syncope.  All other systems reviewed and are negative.   Physical Exam Updated Vital Signs BP (!) 152/92 (BP Location: Right Arm)   Pulse 85   Temp 98.2 F (36.8 C) (Oral)    Resp 20   Ht 5\' 6"  (1.676 m)   Wt 98.4 kg   SpO2 99%   BMI 35.01 kg/m  Physical Exam Vitals and nursing note reviewed.  Constitutional:      General: He is not in acute distress.    Appearance: He is well-developed.  HENT:     Head: Normocephalic and atraumatic.  Eyes:     Conjunctiva/sclera: Conjunctivae normal.  Cardiovascular:     Rate and Rhythm: Normal rate and regular rhythm.  Pulmonary:     Effort: Pulmonary effort is normal. No respiratory distress.  Abdominal:     General: Abdomen is flat. There is no distension.  Musculoskeletal:        General: No swelling or deformity.  Skin:    General: Skin is warm and dry.     Capillary Refill: Capillary refill takes less than 2 seconds.  Neurological:     Mental Status: He is alert and oriented to person, place, and time. Mental status is at baseline.      ED Course/ Medical Decision Making/ A&P Clinical Course as of 06/24/23 1656  Tue Jun 24, 2023  1641 Chest pain for months, headaches for weeks. Blurry vision with headaches for a few months.   [CC]    Clinical Course User Index [CC] Glyn Ade, MD    Procedures Procedures   Medications Ordered in ED Medications - No data to display  Medical Decision Making:   62 year old male presenting with a chief complaint as above.  Very broad syndrome over the past few months with  no acute complaints today.  Triage process included a rule out for ACS, metabolic screening, hematologic screening. No acute pathology was detected on any of the studies fortunately. However his syndrome of intermittent visual disruption and headache does raise concern for possible optic neuritis in the setting of MS flare. Informed patient that he needs immediate neurologic consultation and MRI for rule out of MS flare the patient has directly refused these evaluations today.  States that he does not want to be transferred for neurologic consultation or MRI imaging at this time.  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 States that he is going to take the rest of the week off and needed documentation supporting his ongoing symptoms.  He states that he plans to follow-up with his neurologist in the outpatient setting.  I discussed risk of progression of his disease given the limitations of today's evaluation and he expressed understanding but states he just needs documentation and will work it up in the outpatient setting with his PCP.Marland Kitchen Overall he is in no acute distress.  ACS considered less likely based on the reassuring evaluation and syndrome described. Strict return precautions reinforced he is welcome to return to emergency department for further neurologic care and management.  Disposition:  Patient is requesting discharge at this time.  Given patient's understanding of risk of severe missed diagnosis based on limitations of today's evaluation and risk of interval worsening of disease including life or limb threatening pathology, will participate in shared medical decision making and patient directed discharge at this time.  Patient is welcome to return for further diagnostic evaluation/therapeutic management at any time.    Clinical Impression:  1. Visual disturbance      Discharge   Final Clinical Impression(s) / ED Diagnoses Final diagnoses:  Visual disturbance    Rx / DC Orders ED Discharge Orders     None         Glyn Ade, MD 06/24/23 1656

## 2023-06-24 NOTE — ED Triage Notes (Signed)
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

## 2023-07-22 LAB — PSA
PSA: 0.03
PSA: 0.03

## 2023-08-12 ENCOUNTER — Encounter: Payer: Self-pay | Admitting: Neurology

## 2023-08-18 LAB — HM DIABETES EYE EXAM

## 2023-08-22 ENCOUNTER — Telehealth: Payer: Self-pay | Admitting: Neurology

## 2023-08-22 NOTE — Telephone Encounter (Signed)
Nswered Via [employer]

## 2023-08-22 NOTE — Telephone Encounter (Signed)
Greetings, just checking to see if you have received the fax from the [facility] for my FMLA , I saw where you were out of the office on the day they sent to you,,,, Thanks Hessie Diener [phone]

## 2023-09-04 ENCOUNTER — Telehealth: Payer: Self-pay

## 2023-09-04 NOTE — Telephone Encounter (Signed)
PA needed for Rebif

## 2023-09-10 ENCOUNTER — Ambulatory Visit (INDEPENDENT_AMBULATORY_CARE_PROVIDER_SITE_OTHER): Payer: BC Managed Care – PPO | Admitting: Neurology

## 2023-09-10 ENCOUNTER — Encounter: Payer: Self-pay | Admitting: Neurology

## 2023-09-10 VITALS — BP 133/89 | HR 95 | Ht 69.0 in | Wt 220.0 lb

## 2023-09-10 DIAGNOSIS — G35 Multiple sclerosis: Secondary | ICD-10-CM | POA: Diagnosis not present

## 2023-09-10 NOTE — Progress Notes (Signed)
 NEUROLOGY FOLLOW UP OFFICE NOTE  ZAKARIAH URWIN [id number]  Assessment/Plan:   Multiple sclerosis    DMT:  Rebif  D3 4000 IU daily  Check CBC with diff, LFTs, vit D level Repeat MRI of brain and C-spine with and without contrast in 6 months. Follow up 6 months.   Subjective:  Mervin Ramires is a 63 year old right-handed man with history of prostate cancer who follows up for multiple sclerosis   UPDATE: Current disease modifying therapy: Rebif  (since 2014) Other current medications: D3 4000 IU daily     03/03/2023:  t bili 0.3, ALP 69, AST 21, ALT 23 06/24/2023:  WBC 6.4, HGB 14, HCT 42.5, PLT 229     Vision: No issues Motor: No issues Sensory: No issues Pain: No issues Gait: No issues Bowel/Bladder: No issues Fatigue: No issues Cognition: No issues Mood: Denies depression or anxiety   HISTORY: He was diagnosed with MS in 2001.  At that time, he presented with left facial myokymia. He reports that he hasn't had many flare ups, and only a couple of flare-ups requiring pulse steroids.  They typically present as blurred vision, headache, dizziness, unsteadiness and fatigue.  He has both supratentorial and infratentorial demyelinating lesions.  His cervical cord is okay.  He has a history of lapses with physician follow-ups and non-adherence to disease-modifying agents, mostly due to financial reasons.  He previously was on Avonex , and then Rebif .  Due to financial reasons, he has not followed up with a neurologist in between 2013 and 2014 and had not taken his Rebif .  He developed increased fatigue, stiffness, blurred vision and intermittent headaches, similar to prior flare ups.  He presented to the ED on 05/03/13.  Physical exam revealed slight right sided facial droop.  Initially, a CT of the head was performed and revealed decreased attenuation at the right subfrontal-anterior temporal junction.  MRI Brain w/wo contrast was performed, and revealed extensive periventricular and  subcortical T2 hyperintensities, as well as focal restricted diffusion within the splenium of the corpus callosum, concerning for active demyelination.  He has since restarted Rebif .  Imaging: 02/26/2022 MRI BRAIN W WO:  Stable findings of multiple sclerosis affecting the brainstem, cerebellum and cerebral hemispheres as above. No new or progressive lesions. No lesions show true restricted diffusion or contrast enhancement. Few areas of T2 shine through are stable. 02/26/2022 MRI C-SPINE W WO:  Stable examination since 10/10/2013. Abnormal T2 signal within the cord at C2-3 and C6-7 is stable and does not show any contrast enhancement. No new lesion. 11/18/2020 MRI BRAIN W WO:  1. Findings consistent with chronic advanced multiple sclerosis, overall relatively similar in appearance as compared to most recent brain MRI from 12/02/2019. No evidence for significant disease progression. No active demyelination. 2. No other acute intracranial abnormality. 12/02/2019 MRI BRAIN W WO:  1. Chronically advanced multiple sclerosis with no areas of active demyelination identified. However, multifocal posterior frontal and parietal lobe cortical involvement appears mildly progressed since 2018. 2. No new intracranial abnormality. 3. Evidence of chronic demyelinating plaque in the dorsal upper spinal cord at C2. 11/03/2016 MRI BRAIN W WO:  No change from the prior study. Findings consistent with multiple sclerosis. 02/14/2015 MRI BRAIN W WO:  Moderate changes of multiple sclerosis. No change from 03/08/2014. No lesion show enhancement or restricted diffusion. 03/08/2014 MRI BRAIN W WO:  No new demyelinating plaque or MR findings of active demyelination  detected as detailed above.  10/10/2013 MRI C-SPINE WO:  1. T2 hyperintense  cord lesions at C2-3 and C6-7, consistent with  history of multiple sclerosis.  2. Minimal degenerative disc disease without significant stenosis.  3. Diffuse loss of normal bone marrow signal is  nonspecific and can  be seen in both benign and malignant conditions (anemia with red  marrow reactivation, infiltrative/myelofibrotic marrow processes, or metastatic disease).  07/13/2013 MRI BRAIN W WO:  Single new 7 mm left parietal enhancing white matter lesion  consistent with acute demyelination and disease progression in a  background of severe chronic demyelination. No advanced parenchymal brain volume loss for age.  05/03/2013 MRI BRAIN W WO:  1. Extensive periventricular and subcortical T2 hyperintensities in  patterns consistent with the given diagnosis of multiple sclerosis.  2. Focal restricted diffusion within the splenium of the corpus  callosum is concerning for active demyelination.  3. Based on prior CT exams, there is some progression of disease,  certainly since 2006.  4. Decreased marrow signal within the upper cervical spine. This is  nonspecific, as the patient does not appear to be anemic in prior  laboratory testing. This could be related to prior therapy.   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  PAST MEDICAL HISTORY: Past Medical History:  Diagnosis Date   DM type 2 (diabetes mellitus, type 2) (HCC)    Hyperlipidemia    MS (multiple sclerosis) (HCC)    Prostate cancer (HCC)     MEDICATIONS: Current Outpatient Medications on File Prior to Visit  Medication Sig Dispense Refill   cholecalciferol (VITAMIN D ) 1000 UNITS tablet Take 1,000 Units by mouth daily. VIT D 3 2000 UNITS TAKES 2 TABS = 4000 UNITS     interferon beta-1a  (REBIF ) 44 MCG/0.5ML SOSY injection INJECT ONE SYRINGE SUBCUTANEOUSLY THREE TIMES PER WEEK. KEEP REFRIGERATED. 12 mL 6   Multiple Vitamins-Minerals (MULTIVITAMIN WITH MINERALS) tablet Take 1 tablet by mouth daily.     rosuvastatin  (CRESTOR ) 5 MG tablet Take 1 tablet (5 mg total) by mouth daily. 90 tablet 1   tamsulosin  (FLOMAX ) 0.4 MG CAPS capsule Take 0.4 mg by mouth daily.     No current facility-administered  medications on file prior to visit.    ALLERGIES: No Known Allergies  FAMILY HISTORY: Family History  Problem Relation Age of Onset   Hypertension Mother    Ovarian cancer Mother    Hypertension Father    Prostate cancer Father    Diabetes Sister    Diabetes Brother    Ataxia Neg Hx    Chorea Neg Hx    Dementia Neg Hx    Mental retardation Neg Hx    Migraines Neg Hx    Multiple sclerosis Neg Hx    Neurofibromatosis Neg Hx    Neuropathy Neg Hx    Parkinsonism Neg Hx    Seizures Neg Hx    Stroke Neg Hx    Early death Neg Hx    Cancer Neg Hx    Alcohol abuse Neg Hx    Hearing loss Neg Hx    Heart disease Neg Hx    Hyperlipidemia Neg Hx    Kidney disease Neg Hx    Breast cancer Neg Hx    Colon cancer Neg Hx    Pancreatic cancer Neg Hx    Rectal cancer Neg Hx    Stomach cancer Neg Hx    Esophageal cancer Neg Hx       Objective:  Blood pressure 133/89, pulse 95, height 5' 9 (1.753 m), weight 220 lb (  99.8 kg), SpO2 97%. General: No acute distress.  Patient appears well-groomed.   Head:  Normocephalic/atraumatic Eyes:  Fundi examined but not visualized Neck: supple, no paraspinal tenderness, full range of motion Heart:  Regular rate and rhythm Neurological Exam: alert and oriented.  Speech fluent and not dysarthric, language intact.  CN II-XII intact. Bulk and tone normal, muscle strength 5/5 throughout.  Sensation to light touch and vibration intact.  Deep tendon reflexes 2+ throughout, toes downgoing.  Finger to nose testing intact.  Gait normal, Romberg negative.    Juliene Dunnings, DO  CC: Debby Molt, MD

## 2023-09-10 NOTE — Patient Instructions (Addendum)
 Check MRI of brain and cervical spine with and without contrast in 6 months. We have sent a referral to [facility] for your MRI and they will call you directly to schedule your appointment. They are located at [address] [facility]. If you need to contact them directly please call [phone].  Check CBC with diff, LFTs and vit D now Your provider has requested that you have labwork completed today. Please go to [facility] ([id number]) on the second floor of this building before leaving the office today. You do not need to check in. If you are not called within 15 minutes please check with the front desk.   Continue Rebif , D3 4000 I U daily Follow up 6 months (after repeat MRIs)

## 2023-09-11 LAB — CBC WITH DIFFERENTIAL/PLATELET
Absolute Lymphocytes: 1146 {cells}/uL (ref 850–3900)
Absolute Monocytes: 576 {cells}/uL (ref 200–950)
Basophils Absolute: 51 {cells}/uL (ref 0–200)
Basophils Relative: 0.9 %
Eosinophils Absolute: 188 {cells}/uL (ref 15–500)
Eosinophils Relative: 3.3 %
HCT: 43.5 % (ref 38.5–50.0)
Hemoglobin: 14.3 g/dL (ref 13.2–17.1)
MCH: 28.8 pg (ref 27.0–33.0)
MCHC: 32.9 g/dL (ref 32.0–36.0)
MCV: 87.7 fL (ref 80.0–100.0)
MPV: 10.9 fL (ref 7.5–12.5)
Monocytes Relative: 10.1 %
Neutro Abs: 3739 {cells}/uL (ref 1500–7800)
Neutrophils Relative %: 65.6 %
Platelets: 242 10*3/uL (ref 140–400)
RBC: 4.96 10*6/uL (ref 4.20–5.80)
RDW: 13.2 % (ref 11.0–15.0)
Total Lymphocyte: 20.1 %
WBC: 5.7 10*3/uL (ref 3.8–10.8)

## 2023-09-11 LAB — HEPATIC FUNCTION PANEL
AG Ratio: 1.3 (calc) (ref 1.0–2.5)
ALT: 24 U/L (ref 9–46)
AST: 19 U/L (ref 10–35)
Albumin: 4.5 g/dL (ref 3.6–5.1)
Alkaline phosphatase (APISO): 60 U/L (ref 35–144)
Bilirubin, Direct: 0.1 mg/dL (ref 0.0–0.2)
Globulin: 3.5 g/dL (ref 1.9–3.7)
Indirect Bilirubin: 0.4 mg/dL (ref 0.2–1.2)
Total Bilirubin: 0.5 mg/dL (ref 0.2–1.2)
Total Protein: 8 g/dL (ref 6.1–8.1)

## 2023-09-11 LAB — VITAMIN D 25 HYDROXY (VIT D DEFICIENCY, FRACTURES): Vit D, 25-Hydroxy: 49 ng/mL (ref 30–100)

## 2023-09-17 NOTE — Telephone Encounter (Signed)
PA approved 09/11/23-09/10/24

## 2023-10-06 ENCOUNTER — Other Ambulatory Visit: Payer: Self-pay | Admitting: Neurology

## 2023-10-17 ENCOUNTER — Ambulatory Visit
Admission: RE | Admit: 2023-10-17 | Discharge: 2023-10-17 | Disposition: A | Discharge status: Home / Self Care | Payer: BC Managed Care – PPO | Source: Ambulatory Visit | Attending: Neurology | Admitting: Neurology

## 2023-10-17 DIAGNOSIS — G35 Multiple sclerosis: Secondary | ICD-10-CM

## 2023-10-17 MED ORDER — GADOPICLENOL 0.5 MMOL/ML IV SOLN
10.0000 mL | Freq: Once | INTRAVENOUS | Status: AC | PRN
  Administered 2023-10-17: 10 mL via INTRAVENOUS

## 2023-11-06 ENCOUNTER — Emergency Department (HOSPITAL_BASED_OUTPATIENT_CLINIC_OR_DEPARTMENT_OTHER)

## 2023-11-06 ENCOUNTER — Other Ambulatory Visit: Payer: Self-pay

## 2023-11-06 ENCOUNTER — Encounter (HOSPITAL_BASED_OUTPATIENT_CLINIC_OR_DEPARTMENT_OTHER): Payer: Self-pay | Admitting: Emergency Medicine

## 2023-11-06 ENCOUNTER — Emergency Department (HOSPITAL_BASED_OUTPATIENT_CLINIC_OR_DEPARTMENT_OTHER)
Admission: EM | Admit: 2023-11-06 | Discharge: 2023-11-06 | Disposition: A | Discharge status: Home / Self Care | Attending: Emergency Medicine | Admitting: Emergency Medicine

## 2023-11-06 DIAGNOSIS — X501XXA Overexertion from prolonged static or awkward postures, initial encounter: Secondary | ICD-10-CM | POA: Diagnosis not present

## 2023-11-06 DIAGNOSIS — M25551 Pain in right hip: Secondary | ICD-10-CM | POA: Diagnosis present

## 2023-11-06 MED ORDER — LIDOCAINE 5 % EX PTCH
1.0000 | MEDICATED_PATCH | CUTANEOUS | Status: DC
  Administered 2023-11-06: 1 via TRANSDERMAL
  Filled 2023-11-06: qty 1

## 2023-11-06 MED ORDER — MELOXICAM 7.5 MG PO TABS: 7.5000 mg | ORAL_TABLET | Freq: Every day | ORAL | 0 refills | Status: DC

## 2023-11-06 MED ORDER — METHOCARBAMOL 500 MG PO TABS: 500.0000 mg | ORAL_TABLET | Freq: Two times a day (BID) | ORAL | 0 refills | Status: DC

## 2023-11-06 MED ORDER — LIDOCAINE 5 % EX PTCH: 1.0000 | MEDICATED_PATCH | CUTANEOUS | 0 refills | Status: DC

## 2023-11-06 NOTE — ED Provider Notes (Signed)
 [facility] Provider Note   CSN: [id number] Arrival date & time: 11/06/23  1326     History  Chief Complaint  Patient presents with   Hip Pain    right    Devon Barron is a 63 y.o. male hx of MS here for evaluation of right posterior hip pain. Was walking on Monday and heard a car behind him which he thought was close. He turned quickly to the right and felt immediate pain to his posterior right hip. Does not radiate into his legs. No weakness, numbness. Does not feel like his prior MS flares. No HA, swelling, difficulty with ROM. Walking without difficulty here. Pain worse with movement. Did not get hit by car or fall to the group. Ambulates without assistive device at baseline. States compliance with MS meds. Follows with [facility].  HPI     Home Medications Prior to Admission medications   Medication Sig Start Date End Date Taking? Authorizing Provider  lidocaine (LIDODERM) 5 % Place 1 patch onto the skin daily. Remove & Discard patch within 12 hours or as directed by MD 11/06/23  Yes Demarrio Menges A, PA-C  meloxicam (MOBIC) 7.5 MG tablet Take 1 tablet (7.5 mg total) by mouth daily. 11/06/23  Yes Tavoris Brisk A, PA-C  methocarbamol (ROBAXIN) 500 MG tablet Take 1 tablet (500 mg total) by mouth 2 (two) times daily. 11/06/23  Yes Berenise Hunton A, PA-C  cholecalciferol (VITAMIN D) 1000 UNITS tablet Take 1,000 Units by mouth daily. VIT D 3 2000 UNITS TAKES 2 TABS = 4000 UNITS    [provider]  interferon beta-1a (REBIF) 44 MCG/0.5ML SOSY injection INJECT 1 SYRINGE UNDER THE SKIN 3 TIMES PER WEEK 10/07/23   Everlena Cooper, Adam R, DO  Multiple Vitamins-Minerals (MULTIVITAMIN WITH MINERALS) tablet Take 1 tablet by mouth daily.    [provider]  rosuvastatin (CRESTOR) 5 MG tablet Take 1 tablet (5 mg total) by mouth daily. Patient not taking: Reported on 09/10/2023 04/17/22   Etta Grandchild, MD  tamsulosin (FLOMAX) 0.4 MG  CAPS capsule Take 0.4 mg by mouth daily. 08/26/22   [provider]      Allergies    Patient has no known allergies.    Review of Systems   Review of Systems  Constitutional: Negative.   HENT: Negative.    Respiratory: Negative.    Cardiovascular: Negative.   Gastrointestinal: Negative.   Genitourinary: Negative.   Musculoskeletal:        Right hip pain  Skin: Negative.   Neurological: Negative.   All other systems reviewed and are negative.   Physical Exam Updated Vital Signs BP (!) 147/100 (BP Location: Left Arm)   Pulse 98   Temp (!) 96.9 F (36.1 C)   Resp 18   Wt 93 kg   SpO2 97%   BMI 30.27 kg/m  Physical Exam Vitals and nursing note reviewed.  Constitutional:      General: He is not in acute distress.    Appearance: He is well-developed. He is not ill-appearing, toxic-appearing or diaphoretic.  HENT:     Head: Atraumatic.  Eyes:     Pupils: Pupils are equal, round, and reactive to light.  Cardiovascular:     Rate and Rhythm: Normal rate and regular rhythm.     Pulses: Normal pulses.          Radial pulses are 2+ on the right side and 2+ on the left side.  Dorsalis pedis pulses are 2+ on the right side and 2+ on the left side.     Heart sounds: Normal heart sounds.  Pulmonary:     Effort: Pulmonary effort is normal. No respiratory distress.     Breath sounds: Normal breath sounds.  Abdominal:     General: Bowel sounds are normal. There is no distension.     Palpations: Abdomen is soft.     Tenderness: There is no abdominal tenderness. There is no right CVA tenderness, left CVA tenderness or guarding.  Musculoskeletal:        General: Normal range of motion.     Cervical back: Normal range of motion and neck supple.     Comments: Tenderness to right posterior, and right posterior lateral hip.  Nontender midshaft, distal femur, tib-fib bilaterally.  Nontender left hip.  No midline C/T/L tenderness.  Pain worse with twisting movement.   Skin:    General: Skin is warm and dry.     Capillary Refill: Capillary refill takes less than 2 seconds.     Comments: No edema, erythema, warmth, rashes  Neurological:     General: No focal deficit present.     Mental Status: He is alert and oriented to person, place, and time.     Cranial Nerves: Cranial nerves 2-12 are intact.     Sensory: Sensation is intact.     Motor: Motor function is intact.     Gait: Gait is intact.     Comments: Ambulatory Intact sensation Equal strength     ED Results / Procedures / Treatments   Labs (all labs ordered are listed, but only abnormal results are displayed) Labs Reviewed - No data to display  EKG None  Radiology DG Lumbar Spine Complete Result Date: 11/06/2023 CLINICAL DATA:  Low back pain. EXAM: LUMBAR SPINE - COMPLETE 4+ VIEW COMPARISON:  None Available. FINDINGS: There is no evidence of lumbar spine fracture. Alignment is normal. Mild degenerative disc changes at L5-S1. Intervertebral disc spaces are otherwise maintained. IMPRESSION: Negative. Electronically Signed   By: Hart Robinsons M.D.   On: 11/06/2023 16:17   DG Hip Unilat W or Wo Pelvis 2-3 Views Right Result Date: 11/06/2023 CLINICAL DATA:  Right hip pain. EXAM: DG HIP (WITH OR WITHOUT PELVIS) 2-3V RIGHT COMPARISON:  None Available. FINDINGS: There is no evidence of hip fracture or dislocation. Mild degenerative changes of the bilateral hips. Sacroiliac joints and pubic symphysis are anatomically aligned. Seed implants noted in the prostate. IMPRESSION: No acute osseous abnormality. Electronically Signed   By: Hart Robinsons M.D.   On: 11/06/2023 16:15    Procedures Procedures    Medications Ordered in ED Medications  lidocaine (LIDODERM) 5 % 1 patch (1 patch Transdermal Patch Applied 11/06/23 1544)   ED Course/ Medical Decision Making/ A&P   63 year old here for evaluation of right hip pain after turning fast on Monday.  Pain worse with movement, improved with rest.   Does not radiate to mid back, lower extremities.  No numbness or weakness.  No swelling to extremities.  No saddle anesthesia, bowel or bladder incontinence.  History of MS, clears typically present with weakness and numbness according to patient.  He states he is compliant with MS meds.  On exam patient has reproducible tenderness to his right posterior and right posterior lateral hip.  No overlying skin changes, to suggest infectious process.  No obvious effusion.  Negative straight leg raise.  No mid C/T/L tenderness.  No clinical evidence of VTE  on exam.  He is neurovascularly intact.  Will plan on x-ray.  Suspect likely muscle sprain or strain.  Did consider MS flare however he states typically his MS flares present with headache, blurred vision and weakness which he has not had.  Given he has enticing event I suspect this is most likely injury versus demyelinating process.  Imaging personally viewed and interpreted:  Xray Hip no acute abnormality Xray Lumbar no acute abnormality  Patient reassessed.  We discussed imaging with patient.  His pain is reproducible on exam, giving twisting injury will write for anti-inflammatories, muscle relaxers and lidocaine patches.  Will have him follow-up in a few days.  If symptoms do not improve or worsen he will return.  The patient has been appropriately medically screened and/or stabilized in the ED. I have low suspicion for any other emergent medical condition which would require further screening, evaluation or treatment in the ED or require inpatient management.  Patient is hemodynamically stable and in no acute distress.  Patient able to ambulate in department prior to ED.  Evaluation does not show acute pathology that would require ongoing or additional emergent interventions while in the emergency department or further inpatient treatment.  I have discussed the diagnosis with the patient and answered all questions.  Pain is been managed while in the  emergency department and patient has no further complaints prior to discharge.  Patient is comfortable with plan discussed in room and is stable for discharge at this time.  I have discussed strict return precautions for returning to the emergency department.  Patient was encouraged to follow-up with PCP/specialist refer to at discharge.                                Medical Decision Making Amount and/or Complexity of Data Reviewed External Data Reviewed: labs, radiology and notes. Radiology: ordered and independent interpretation performed. Decision-making details documented in ED Course.  Risk OTC drugs. Prescription drug management. Decision regarding hospitalization. Diagnosis or treatment significantly limited by social determinants of health.         Final Clinical Impression(s) / ED Diagnoses Final diagnoses:  Right hip pain    Rx / DC Orders ED Discharge Orders          Ordered    methocarbamol (ROBAXIN) 500 MG tablet  2 times daily        11/06/23 1635    meloxicam (MOBIC) 7.5 MG tablet  Daily        11/06/23 1635    lidocaine (LIDODERM) 5 %  Every 24 hours        11/06/23 1635              Kamin Niblack A, PA-C 11/06/23 1638    Rexford Maus, DO 11/07/23 [phone]

## 2023-11-06 NOTE — ED Notes (Signed)
 Pt. Reports he turned too fast and poss. The bed he is sleeping on has caused him to have R hip pain.

## 2023-11-06 NOTE — ED Triage Notes (Signed)
 Right hip pain today , turned fast and thinks he twisted his hip . Ambulatory to triage room . Hx pain same area .

## 2023-11-06 NOTE — Discharge Instructions (Signed)
 I written you for a few medications to help with your symptoms  Lidocaine patches this is a patch that you placed to the area where you have pain.  You leave on for 12 hours.  Take off after 12 hours.  Must be patch free before placing additional patch for 12 hours Mobic this is an anti-inflammatory, take once daily.  If you develop epigastric pain or dark or tarry stools please stop taking this medication seek reevaluation as this can cause ulcers Robaxin this is a muscle relaxer.  Make sure to follow-up outpatient in a few days, return for any worsening symptoms.

## 2023-11-16 ENCOUNTER — Encounter: Payer: Self-pay | Admitting: Neurology

## 2023-11-20 ENCOUNTER — Emergency Department (HOSPITAL_BASED_OUTPATIENT_CLINIC_OR_DEPARTMENT_OTHER)
Admission: EM | Admit: 2023-11-20 | Discharge: 2023-11-20 | Disposition: A | Discharge status: Home / Self Care | Attending: Emergency Medicine | Admitting: Emergency Medicine

## 2023-11-20 ENCOUNTER — Other Ambulatory Visit: Payer: Self-pay

## 2023-11-20 ENCOUNTER — Encounter (HOSPITAL_BASED_OUTPATIENT_CLINIC_OR_DEPARTMENT_OTHER): Payer: Self-pay

## 2023-11-20 ENCOUNTER — Emergency Department (HOSPITAL_BASED_OUTPATIENT_CLINIC_OR_DEPARTMENT_OTHER)

## 2023-11-20 DIAGNOSIS — Z8546 Personal history of malignant neoplasm of prostate: Secondary | ICD-10-CM | POA: Diagnosis not present

## 2023-11-20 DIAGNOSIS — M25562 Pain in left knee: Secondary | ICD-10-CM | POA: Diagnosis present

## 2023-11-20 DIAGNOSIS — E119 Type 2 diabetes mellitus without complications: Secondary | ICD-10-CM | POA: Diagnosis not present

## 2023-11-20 MED ORDER — MELOXICAM 15 MG PO TABS: 15.0000 mg | ORAL_TABLET | Freq: Every day | ORAL | 0 refills | Status: AC

## 2023-11-20 NOTE — ED Provider Notes (Signed)
 Emergency Department Provider Note   I have reviewed the triage vital signs and the nursing notes.   HISTORY  Chief Complaint Knee Pain   HPI Devon Barron is a 63 y.o. male with past history of diabetes, hyperlipidemia presents to the emergency department for evaluation of left knee pain.  Patient states he has had intermittent pain over the past 9 to 12 months.  He works at [employer] often 7 days/week.  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  His pain is worse in the anterior and medial knee.  No fevers.  No falls or known injuries. Pain is more constant now which prompts his ED visit.    Past Medical History:  Diagnosis Date   DM type 2 (diabetes mellitus, type 2) (HCC)    Hyperlipidemia    MS (multiple sclerosis) (HCC)    Prostate cancer (HCC)     Review of Systems  Constitutional: No fever/chills Cardiovascular: Denies chest pain. Respiratory: Denies shortness of breath. Musculoskeletal: Positive left knee pain.  Skin: Negative for rash. Neurological: Negative for numbness.   ____________________________________________   PHYSICAL EXAM:  VITAL SIGNS: ED Triage Vitals  Encounter Vitals Group     BP 11/20/23 1031 (!) 150/113     Pulse Rate 11/20/23 1031 94     Resp 11/20/23 1031 18     Temp 11/20/23 1031 97.6 F (36.4 C)     Temp Source 11/20/23 1031 Oral     SpO2 11/20/23 1031 98 %     Weight 11/20/23 1032 205 lb (93 kg)     Height 11/20/23 1032 5\' 9"  (1.753 m)   Constitutional: Alert and oriented. Well appearing and in no acute distress. Eyes: Conjunctivae are normal.  Head: Atraumatic. Nose: No congestion/rhinnorhea. Mouth/Throat: Mucous membranes are moist. Neck: No stridor.   Cardiovascular: Normal rate, regular rhythm. Good peripheral circulation.  Respiratory: Normal respiratory effort.   Gastrointestinal: No distention.  Musculoskeletal: No large left knee effusion.  Normal range of motion.  Mild tenderness along the  medial aspect of the left knee.  Neurologic:  Normal speech and language. Normal sensation in the distal LLE.   ____________________________________________  RADIOLOGY  DG Knee Complete 4 Views Left Result Date: 11/20/2023 CLINICAL DATA:  Chronic knee pain EXAM: LEFT KNEE - COMPLETE 4 VIEW COMPARISON:  None Available. FINDINGS: No evidence of fracture, dislocation, or joint effusion. No evidence of arthropathy or other focal bone abnormality. Soft tissues are unremarkable. IMPRESSION: No acute osseous abnormality. Electronically Signed   By: Karen Kays M.D.   On: 11/20/2023 12:00    ____________________________________________   PROCEDURES  Procedure(s) performed:   Procedures  None  ____________________________________________   INITIAL IMPRESSION / ASSESSMENT AND PLAN / ED COURSE  Pertinent labs & imaging results that were available during my care of the patient were reviewed by me and considered in my medical decision making (see chart for details).   This patient is Presenting for Evaluation of left knee pain, which does require a range of treatment options, and is a complaint that involves a moderate risk of morbidity and mortality.  The Differential Diagnoses include knee contusion, sprain, fracture, dislocation, etc.  Radiologic Tests Ordered, included knee XR. I independently interpreted the images and agree with radiology interpretation.   Medical Decision Making: Summary:  Patient presents emergency department left knee pain for the past 9 to 12 months.  Becoming more constant.  Has a fairly physical job with repetitive motion.  Plan  for XR and reassess. Likely treatment with NSAIDs and ortho follow up.   Reevaluation with update and discussion with patient. XR without acute findings. Plan for work note, NSAIDs, and ortho follow up.    Patient's presentation is most consistent with acute presentation with potential threat to life or bodily function.    Disposition: discharge  ____________________________________________  FINAL CLINICAL IMPRESSION(S) / ED DIAGNOSES  Final diagnoses:  Acute pain of left knee    Note:  This document was prepared using Dragon voice recognition software and may include unintentional dictation errors.  Abby Hocking, MD, [facility]    Avah Bashor, Shereen Dike, MD 11/20/23 [phone]

## 2023-11-20 NOTE — Discharge Instructions (Signed)
 Please re-start the anti-inflammatory medication prescribed here. Call the orthopedist office today and schedule a follow up appointment.

## 2023-11-20 NOTE — ED Triage Notes (Signed)
 Pt states that his left knee has been having pain over the past year, uses icy hot with some relief, but has recently gotten worse. No known injury. Hx of MS.   Anastacio Balm, RN

## 2023-11-24 ENCOUNTER — Encounter (HOSPITAL_BASED_OUTPATIENT_CLINIC_OR_DEPARTMENT_OTHER): Payer: Self-pay | Admitting: Student

## 2023-11-24 ENCOUNTER — Ambulatory Visit (INDEPENDENT_AMBULATORY_CARE_PROVIDER_SITE_OTHER): Admitting: Student

## 2023-11-24 DIAGNOSIS — M25562 Pain in left knee: Secondary | ICD-10-CM | POA: Diagnosis not present

## 2023-11-24 DIAGNOSIS — G8929 Other chronic pain: Secondary | ICD-10-CM

## 2023-11-24 MED ORDER — LIDOCAINE HCL 1 % IJ SOLN
4.0000 mL | INTRAMUSCULAR | Status: AC | PRN
  Administered 2023-11-24: 4 mL

## 2023-11-24 MED ORDER — TRIAMCINOLONE ACETONIDE 40 MG/ML IJ SUSP
2.0000 mL | INTRAMUSCULAR | Status: AC | PRN
  Administered 2023-11-24: 2 mL via INTRA_ARTICULAR

## 2023-11-24 NOTE — Progress Notes (Signed)
 Chief Complaint: Left knee pain    Discussed the use of AI scribe software for clinical note transcription with the patient, who gave verbal consent to proceed.  History of Present Illness Efe, a patient with a known history of multiple sclerosis, presents with left knee pain that has been ongoing for approximately nine months. The pain is mainly on the inside part of the knee and has been worsening. He has been using over-the-counter anti-inflammatory medications and a roll-on analgesic, which provide some relief. However, he still experiences discomfort. He denies any history of injury to the knee and reports no locking, buckling, or instability of the joint.  About three weeks ago, Kareem experienced a hip injury while at work, which caused significant pain and limited mobility. He sought medical attention and was prescribed meloxicam , which he reports helped with the pain but caused drowsiness. The hip pain has since resolved, but the knee pain persists. Quantarius has a physically demanding job at [employer], which involves climbing in and out of a truck constantly. He has been doing this for the past 34 years.  Surgical History:   None  PMH/PSH/Family History/Social History/Meds/Allergies:    Past Medical History:  Diagnosis Date   DM type 2 (diabetes mellitus, type 2) (HCC)    Hyperlipidemia    MS (multiple sclerosis) (HCC)    Prostate cancer [facility])    Past Surgical History:  Procedure Laterality Date   CYSTOSCOPY  12/07/2020   Procedure: CYSTOSCOPY FLEXIBLE;  Surgeon: Andrez Banker, MD;  Location: [facility];  Service: Urology;;   DEVIATED SETUM SURGERY  25 YRS AGO   HERNIA REPAIR  03/2013   INGUINAL   PROSTATE BIOPSY     RADIOACTIVE SEED IMPLANT N/A 12/07/2020   Procedure: RADIOACTIVE SEED IMPLANT/BRACHYTHERAPY IMPLANT;  Surgeon: Andrez Banker, MD;  Location[facility];  Service: Urology;  Laterality: N/A;   SPACE  OAR INSTILLATION N/A 12/07/2020   Procedure: SPACE OAR INSTILLATION;  Surgeon: Andrez Banker, MD;  Location: [facility];  Service: Urology;  Laterality: N/A;   Social History   Socioeconomic History   Marital status: Single    Spouse name: Not on file   Number of children: 1   Years of education: Not on file   Highest education level: Some college, no degree  Occupational History   Occupation: DRIVER    Employer: [employer]  Tobacco Use   Smoking status: Never   Smokeless tobacco: Never  Vaping Use   Vaping status: Never Used  Substance and Sexual Activity   Alcohol use: Yes    Alcohol/week: 5.0 standard drinks of alcohol    Types: 5 Cans of beer per week    Comment: occasional   Drug use: No   Sexual activity: Yes    Partners: Female  Other Topics Concern   Not on file  Social History Narrative   Patient is right-handed. His grown son lives with him in a 2 level home. He uses a home exercise machine 2 x weekly.   Social Drivers of Corporate investment banker Strain: Not on file  Food Insecurity: No Food Insecurity (10/03/2020)   Hunger Vital Sign    Worried About Running Out of Food in the Last Year: Never true    Ran Out of Food  in the Last Year: Never true  Transportation Needs: No Transportation Needs (10/03/2020)   PRAPARE - Administrator, Civil Service (Medical): No    Lack of Transportation (Non-Medical): No  Physical Activity: Not on file  Stress: Not on file  Social Connections: Not on file   Family History  Problem Relation Age of Onset   Hypertension Mother    Ovarian cancer Mother    Hypertension Father    Prostate cancer Father    Diabetes Sister    Diabetes Brother    Ataxia Neg Hx    Chorea Neg Hx    Dementia Neg Hx    Mental retardation Neg Hx    Migraines Neg Hx    Multiple sclerosis Neg Hx    Neurofibromatosis Neg Hx    Neuropathy Neg Hx    Parkinsonism Neg Hx    Seizures Neg Hx    Stroke Neg Hx    Early death  Neg Hx    Cancer Neg Hx    Alcohol abuse Neg Hx    Hearing loss Neg Hx    Heart disease Neg Hx    Hyperlipidemia Neg Hx    Kidney disease Neg Hx    Breast cancer Neg Hx    Colon cancer Neg Hx    Pancreatic cancer Neg Hx    Rectal cancer Neg Hx    Stomach cancer Neg Hx    Esophageal cancer Neg Hx    No Known Allergies Current Outpatient Medications  Medication Sig Dispense Refill   cholecalciferol (VITAMIN D ) 1000 UNITS tablet Take 1,000 Units by mouth daily. VIT D 3 2000 UNITS TAKES 2 TABS = 4000 UNITS     interferon beta-1a  (REBIF ) 44 MCG/0.5ML SOSY injection INJECT 1 SYRINGE UNDER THE SKIN 3 TIMES PER WEEK 12 mL 6   lidocaine  (LIDODERM ) 5 % Place 1 patch onto the skin daily. Remove & Discard patch within 12 hours or as directed by MD 30 patch 0   meloxicam  (MOBIC ) 15 MG tablet Take 1 tablet (15 mg total) by mouth daily for 7 days. 7 tablet 0   methocarbamol  (ROBAXIN ) 500 MG tablet Take 1 tablet (500 mg total) by mouth 2 (two) times daily. 20 tablet 0   Multiple Vitamins-Minerals (MULTIVITAMIN WITH MINERALS) tablet Take 1 tablet by mouth daily.     rosuvastatin  (CRESTOR ) 5 MG tablet Take 1 tablet (5 mg total) by mouth daily. (Patient not taking: Reported on 09/10/2023) 90 tablet 1   tamsulosin  (FLOMAX ) 0.4 MG CAPS capsule Take 0.4 mg by mouth daily.     No current facility-administered medications for this visit.   No results found.  Review of Systems:   A ROS was performed including pertinent positives and negatives as documented in the HPI.  Physical Exam :   Constitutional: NAD and appears stated age Neurological: Alert and oriented Psych: Appropriate affect and cooperative There were no vitals taken for this visit.   Comprehensive Musculoskeletal Exam:    Left knee exam demonstrates active range of motion from 20 to 110 degrees.  No significant lateral medial joint line tenderness.  Stable collateral ligaments with varus and valgus stress.  Patient ambulating with slight  antalgic gait.  No overlying erythema or warmth.  Imaging:   Xray review from 11/20/2023 (left knee 4 views): Negative for bony abnormality   I personally reviewed and interpreted the radiographs.      Assessment & Plan Left knee pain Pain has been ongoing for at least  the last 9 months with no known injury.  X-rays taken a few days ago show very minimal degenerative changes.  He does work a very physically demanding job so may be more of an overuse mechanism.  Pain is located mainly in the medial knee.  He is a diabetic although last A1c was 6.6, therefore I have offered a diagnostic cortisone injection of the knee today which patient is agreeable to.  This was performed in clinic today without complication.  Will monitor relief and have him return to clinic as needed.     Procedure Note  Patient: Devon Barron             Date of Birth: [birth year]           MRN: [id number]             Visit Date: 11/24/2023  Procedures: Visit Diagnoses:  1. Chronic pain of left knee     Large Joint Inj: L knee on 11/24/2023 7:04 PM Indications: pain Details: 22 G 1.5 in needle, anterolateral approach Medications: 4 mL lidocaine  1 %; 2 mL triamcinolone  acetonide 40 MG/ML Outcome: tolerated well, no immediate complications Procedure, treatment alternatives, risks and benefits explained, specific risks discussed. Consent was given by the patient. Immediately prior to procedure a time out was called to verify the correct patient, procedure, equipment, support staff and site/side marked as required. Patient was prepped and draped in the usual sterile fashion.       I personally saw and evaluated the patient, and participated in the management and treatment plan.  Sharrell Deck, PA-C Orthopedics

## 2023-11-26 ENCOUNTER — Telehealth: Payer: Self-pay

## 2023-11-26 NOTE — Transitions of Care (Post Inpatient/ED Visit) (Signed)
   11/26/2023  Name: Devon Barron MRN: [id number] DOB: September 20, 1960  Today's TOC FU Call Status: Today's TOC FU Call Status:: Successful TOC FU Call Completed TOC FU Call Complete Date: 11/26/23 Patient's Name and Date of Birth confirmed.  Transition Care Management Follow-up Telephone Call Date of Discharge: 11/20/23 Discharge Facility: [facility] Type of Discharge: Emergency Department Reason for ED Visit: Other: (knee pain) How have you been since you were released from the hospital?: Better Any questions or concerns?: No  Items Reviewed: Did you receive and understand the discharge instructions provided?: Yes Medications obtained,verified, and reconciled?: Yes (Medications Reviewed) Any new allergies since your discharge?: No Dietary orders reviewed?: No Do you have support at home?: No  Medications Reviewed Today: Medications Reviewed Today   Medications were not reviewed in this encounter     Home Care and Equipment/Supplies: Were Home Health Services Ordered?: No Any new equipment or medical supplies ordered?: No  Functional Questionnaire: Do you need assistance with bathing/showering or dressing?: No Do you need assistance with meal preparation?: No Do you need assistance with eating?: No Do you have difficulty maintaining continence: No Do you need assistance with getting out of bed/getting out of a chair/moving?: No Do you have difficulty managing or taking your medications?: No  Follow up appointments reviewed: PCP Follow-up appointment confirmed?: NA Specialist Hospital Follow-up appointment confirmed?: NA Do you need transportation to your follow-up appointment?: No Do you understand care options if your condition(s) worsen?: Yes-patient verbalized understanding    SIGNATURE: Alitza Cowman,CMA

## 2023-12-01 ENCOUNTER — Encounter: Payer: Self-pay | Admitting: Neurology

## 2023-12-10 ENCOUNTER — Encounter (HOSPITAL_COMMUNITY): Payer: Self-pay

## 2023-12-14 ENCOUNTER — Encounter: Payer: Self-pay | Admitting: Neurology

## 2023-12-17 ENCOUNTER — Encounter: Payer: Self-pay | Admitting: Internal Medicine

## 2023-12-17 ENCOUNTER — Ambulatory Visit: Admitting: Internal Medicine

## 2023-12-17 VITALS — BP 142/96 | HR 91 | Temp 97.9°F | Resp 16 | Ht 69.0 in | Wt 215.2 lb

## 2023-12-17 DIAGNOSIS — Z0000 Encounter for general adult medical examination without abnormal findings: Secondary | ICD-10-CM | POA: Diagnosis not present

## 2023-12-17 DIAGNOSIS — E785 Hyperlipidemia, unspecified: Secondary | ICD-10-CM | POA: Diagnosis not present

## 2023-12-17 DIAGNOSIS — Z0001 Encounter for general adult medical examination with abnormal findings: Secondary | ICD-10-CM | POA: Insufficient documentation

## 2023-12-17 DIAGNOSIS — Z23 Encounter for immunization: Secondary | ICD-10-CM

## 2023-12-17 DIAGNOSIS — I10 Essential (primary) hypertension: Secondary | ICD-10-CM | POA: Diagnosis not present

## 2023-12-17 DIAGNOSIS — E118 Type 2 diabetes mellitus with unspecified complications: Secondary | ICD-10-CM

## 2023-12-17 DIAGNOSIS — R9431 Abnormal electrocardiogram [ECG] [EKG]: Secondary | ICD-10-CM | POA: Diagnosis not present

## 2023-12-17 LAB — URINALYSIS, ROUTINE W REFLEX MICROSCOPIC
Ketones, ur: NEGATIVE
Leukocytes,Ua: NEGATIVE
Nitrite: NEGATIVE
Specific Gravity, Urine: 1.025 (ref 1.000–1.030)
Total Protein, Urine: 30 — AB
Urine Glucose: NEGATIVE
Urobilinogen, UA: 1 (ref 0.0–1.0)
pH: 6 (ref 5.0–8.0)

## 2023-12-17 LAB — BASIC METABOLIC PANEL WITH GFR
BUN: 16 mg/dL (ref 6–23)
CO2: 28 meq/L (ref 19–32)
Calcium: 9.6 mg/dL (ref 8.4–10.5)
Chloride: 100 meq/L (ref 96–112)
Creatinine, Ser: 1.15 mg/dL (ref 0.40–1.50)
GFR: 68.08 mL/min (ref >60.00)
Glucose, Bld: 97 mg/dL (ref 70–99)
Potassium: 4.1 meq/L (ref 3.5–5.1)
Sodium: 136 meq/L (ref 135–145)

## 2023-12-17 LAB — LIPID PANEL
Cholesterol: 222 mg/dL — ABNORMAL HIGH (ref 0–200)
HDL: 56.2 mg/dL (ref >39.00)
LDL Cholesterol: 143 mg/dL — ABNORMAL HIGH (ref 0–99)
NonHDL: 166.15
Total CHOL/HDL Ratio: 4
Triglycerides: 114 mg/dL (ref 0.0–149.0)
VLDL: 22.8 mg/dL (ref 0.0–40.0)

## 2023-12-17 LAB — MICROALBUMIN / CREATININE URINE RATIO
Creatinine,U: 424.1 mg/dL
Microalb Creat Ratio: 13.5 mg/g (ref 0.0–30.0)
Microalb, Ur: 5.7 mg/dL — ABNORMAL HIGH (ref 0.0–1.9)

## 2023-12-17 LAB — HEMOGLOBIN A1C: Hgb A1c MFr Bld: 6.5 % (ref 4.6–6.5)

## 2023-12-17 LAB — TSH: TSH: 1.43 u[IU]/mL (ref 0.35–5.50)

## 2023-12-17 MED ORDER — ROSUVASTATIN CALCIUM 20 MG PO TABS: 20.0000 mg | ORAL_TABLET | Freq: Every day | ORAL | 0 refills | Status: DC

## 2023-12-17 MED ORDER — ROSUVASTATIN CALCIUM 5 MG PO TABS: 5.0000 mg | ORAL_TABLET | Freq: Every day | ORAL | 1 refills | Status: DC

## 2023-12-17 MED ORDER — SHINGRIX 50 MCG/0.5ML IM SUSR: 0.5000 mL | Freq: Once | INTRAMUSCULAR | 1 refills | Status: AC

## 2023-12-17 MED ORDER — INDAPAMIDE 1.25 MG PO TABS: 1.2500 mg | ORAL_TABLET | Freq: Every day | ORAL | 0 refills | Status: DC

## 2023-12-17 MED ORDER — AMLODIPINE BESYLATE 5 MG PO TABS: 5.0000 mg | ORAL_TABLET | Freq: Every day | ORAL | 0 refills | Status: DC

## 2023-12-17 NOTE — Patient Instructions (Signed)
 Health Maintenance, Male  Adopting a healthy lifestyle and getting preventive care are important in promoting health and wellness. Ask your health care provider about:  The right schedule for you to have regular tests and exams.  Things you can do on your own to prevent diseases and keep yourself healthy.  What should I know about diet, weight, and exercise?  Eat a healthy diet    Eat a diet that includes plenty of vegetables, fruits, low-fat dairy products, and lean protein.  Do not eat a lot of foods that are high in solid fats, added sugars, or sodium.  Maintain a healthy weight  Body mass index (BMI) is a measurement that can be used to identify possible weight problems. It estimates body fat based on height and weight. Your health care provider can help determine your BMI and help you achieve or maintain a healthy weight.  Get regular exercise  Get regular exercise. This is one of the most important things you can do for your health. Most adults should:  Exercise for at least 150 minutes each week. The exercise should increase your heart rate and make you sweat (moderate-intensity exercise).  Do strengthening exercises at least twice a week. This is in addition to the moderate-intensity exercise.  Spend less time sitting. Even light physical activity can be beneficial.  Watch cholesterol and blood lipids  Have your blood tested for lipids and cholesterol at 63 years of age, then have this test every 5 years.  You may need to have your cholesterol levels checked more often if:  Your lipid or cholesterol levels are high.  You are older than 63 years of age.  You are at high risk for heart disease.  What should I know about cancer screening?  Many types of cancers can be detected early and may often be prevented. Depending on your health history and family history, you may need to have cancer screening at various ages. This may include screening for:  Colorectal cancer.  Prostate cancer.  Skin cancer.  Lung  cancer.  What should I know about heart disease, diabetes, and high blood pressure?  Blood pressure and heart disease  High blood pressure causes heart disease and increases the risk of stroke. This is more likely to develop in people who have high blood pressure readings or are overweight.  Talk with your health care provider about your target blood pressure readings.  Have your blood pressure checked:  Every 3-5 years if you are 9-95 years of age.  Every year if you are 85 years old or older.  If you are between the ages of 29 and 29 and are a current or former smoker, ask your health care provider if you should have a one-time screening for abdominal aortic aneurysm (AAA).  Diabetes  Have regular diabetes screenings. This checks your fasting blood sugar level. Have the screening done:  Once every three years after age 23 if you are at a normal weight and have a low risk for diabetes.  More often and at a younger age if you are overweight or have a high risk for diabetes.  What should I know about preventing infection?  Hepatitis B  If you have a higher risk for hepatitis B, you should be screened for this virus. Talk with your health care provider to find out if you are at risk for hepatitis B infection.  Hepatitis C  Blood testing is recommended for:  Everyone born from [birth year] through 1965.  Anyone  with known risk factors for hepatitis C.  Sexually transmitted infections (STIs)  You should be screened each year for STIs, including gonorrhea and chlamydia, if:  You are sexually active and are younger than 62 years of age.  You are older than 63 years of age and your health care provider tells you that you are at risk for this type of infection.  Your sexual activity has changed since you were last screened, and you are at increased risk for chlamydia or gonorrhea. Ask your health care provider if you are at risk.  Ask your health care provider about whether you are at high risk for HIV. Your health care provider  may recommend a prescription medicine to help prevent HIV infection. If you choose to take medicine to prevent HIV, you should first get tested for HIV. You should then be tested every 3 months for as long as you are taking the medicine.  Follow these instructions at home:  Alcohol use  Do not drink alcohol if your health care provider tells you not to drink.  If you drink alcohol:  Limit how much you have to 0-2 drinks a day.  Know how much alcohol is in your drink. In the [place] one drink equals one 12 oz bottle of beer (355 mL), one 5 oz glass of wine (148 mL), or one 1 oz glass of hard liquor (44 mL).  Lifestyle  Do not use any products that contain nicotine or tobacco. These products include cigarettes, chewing tobacco, and vaping devices, such as e-cigarettes. If you need help quitting, ask your health care provider.  Do not use street drugs.  Do not share needles.  Ask your health care provider for help if you need support or information about quitting drugs.  General instructions  Schedule regular health, dental, and eye exams.  Stay current with your vaccines.  Tell your health care provider if:  You often feel depressed.  You have ever been abused or do not feel safe at home.  Summary  Adopting a healthy lifestyle and getting preventive care are important in promoting health and wellness.  Follow your health care provider's instructions about healthy diet, exercising, and getting tested or screened for diseases.  Follow your health care provider's instructions on monitoring your cholesterol and blood pressure.  This information is not intended to replace advice given to you by your health care provider. Make sure you discuss any questions you have with your health care provider.  Document Revised: 12/11/2020 Document Reviewed: 12/11/2020  Elsevier Patient Education  2024 [employer].
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
## 2023-12-18 ENCOUNTER — Ambulatory Visit: Payer: Self-pay | Admitting: Internal Medicine

## 2023-12-23 LAB — ALDOSTERONE + RENIN ACTIVITY W/ RATIO
ALDO / PRA Ratio: 32.5 ratio — ABNORMAL HIGH (ref 0.9–28.9)
Aldosterone: 13 ng/dL
Renin Activity: 0.4 ng/mL/h (ref 0.25–5.82)

## 2024-01-02 ENCOUNTER — Other Ambulatory Visit: Payer: Self-pay | Admitting: Internal Medicine

## 2024-01-02 ENCOUNTER — Telehealth: Payer: Self-pay

## 2024-01-02 DIAGNOSIS — E118 Type 2 diabetes mellitus with unspecified complications: Secondary | ICD-10-CM

## 2024-01-02 MED ORDER — DEXCOM G7 RECEIVER DEVI
1.0000 | Freq: Every day | 1 refills | Status: DC
Start: 2024-01-02 — End: 2024-04-29

## 2024-01-02 MED ORDER — DEXCOM G7 SENSOR MISC: 1.0000 | Freq: Every day | 1 refills | Status: DC

## 2024-01-02 NOTE — Telephone Encounter (Signed)
 Copied from CRM [id number]. Topic: Clinical - Prescription Issue >> Jan 02, 2024  9:38 AM Emylou G wrote: Reason for CRM: Tory Freiberg Nurse case mgr w/ [employer] for [employer].Devon Barron would like to order blood sugar monitor ( dexcom ) looking for 90 day supply to be sent to [facility] wasn't sure on which he prefers.Devon Barron

## 2024-01-02 NOTE — Telephone Encounter (Signed)
**Note De-identified  [place] Obfuscation** Please advise 

## 2024-01-07 ENCOUNTER — Other Ambulatory Visit (HOSPITAL_COMMUNITY): Payer: Self-pay | Admitting: *Deleted

## 2024-01-07 ENCOUNTER — Encounter (HOSPITAL_COMMUNITY): Payer: Self-pay

## 2024-01-07 MED ORDER — METOPROLOL TARTRATE 100 MG PO TABS: ORAL_TABLET | ORAL | 0 refills | Status: DC

## 2024-01-09 ENCOUNTER — Ambulatory Visit (HOSPITAL_COMMUNITY)
Admission: RE | Admit: 2024-01-09 | Discharge: 2024-01-09 | Disposition: A | Discharge status: Home / Self Care | Source: Ambulatory Visit | Attending: Internal Medicine | Admitting: Internal Medicine

## 2024-01-09 ENCOUNTER — Encounter: Payer: Self-pay | Admitting: Cardiology

## 2024-01-09 DIAGNOSIS — E785 Hyperlipidemia, unspecified: Secondary | ICD-10-CM

## 2024-01-09 DIAGNOSIS — R9431 Abnormal electrocardiogram [ECG] [EKG]: Secondary | ICD-10-CM | POA: Diagnosis present

## 2024-01-09 MED ORDER — IOHEXOL 350 MG/ML SOLN
100.0000 mL | Freq: Once | INTRAVENOUS | Status: AC | PRN
  Administered 2024-01-09: 100 mL via INTRAVENOUS

## 2024-01-09 MED ORDER — NITROGLYCERIN 0.4 MG SL SUBL
0.8000 mg | SUBLINGUAL_TABLET | Freq: Once | SUBLINGUAL | Status: AC
  Administered 2024-01-09: 0.8 mg via SUBLINGUAL

## 2024-01-09 NOTE — Progress Notes (Signed)
 Patient tolerated CT well. Vital signs stable encourage to drink water throughout day.Reasons explained and verbalized understanding. Ambulated steady gait.

## 2024-01-13 ENCOUNTER — Encounter: Payer: Self-pay | Admitting: Internal Medicine

## 2024-01-21 ENCOUNTER — Telehealth: Payer: Self-pay

## 2024-01-21 NOTE — Telephone Encounter (Signed)
 Copied from CRM [id number]. Topic: Clinical - Lab/Test Results >> Jan 21, 2024  2:57 PM Deaijah H wrote: Reason for CRM: Katy Case Nurse Manager called in wanting information regarding CT scan and if CT scan had anything kidney related and able to review. Please call [phone]

## 2024-02-09 NOTE — Telephone Encounter (Unsigned)
 Copied from CRM [id number]. Topic: Clinical - Medical Advice >> Feb 09, 2024 12:36 PM Thersia BROCKS wrote: Reason for CRM:  [facility] , katie [employer] benefits   wanted to know for how long does he need to use  Continuous Glucose Sensor (DEXCOM G7 SENSOR) MISC  ct scan , wanted to know what he needs to do regarding that   [phone]

## 2024-02-13 NOTE — Telephone Encounter (Signed)
 Patient wants to know if there is something that he needs to do about the small hernia that you made him aware of from the CT scan.

## 2024-02-13 NOTE — Telephone Encounter (Signed)
 Patient states that he is not having any symptoms and that he didn't know it was there until you told him. He states that if he develops any symptoms he will give us  a call. Juts a FYI

## 2024-02-19 ENCOUNTER — Telehealth: Payer: Self-pay

## 2024-02-19 NOTE — Telephone Encounter (Signed)
 Copied from CRM [id number]. Topic: Clinical - Medical Advice >> Feb 19, 2024  2:22 PM Mercedes MATSU wrote: Reason for CRM: Rockie calling from [facility] stated that she called on the 7th and spoke with someone else. She said the patient has questions about the hernia that was found on the CT scan he just had completed, and if he should be doing anything about it. He states that he has not received a response. Patient also has been using his Dexcom, and he wants to know when his A1C will be checked. katy from [employer] can be reached at [phone] direct line.

## 2024-02-20 NOTE — Telephone Encounter (Signed)
 I spoke with this patient on the 11th in regards to this exact question. I also just called the patient back and he told me that he hasn't had time to let katie know that there isn't anything to worry about.

## 2024-03-04 ENCOUNTER — Telehealth: Payer: Self-pay

## 2024-03-04 NOTE — Telephone Encounter (Signed)
 Copied from CRM [id number]. Topic: General - Other >> Mar 04, 2024 12:22 PM Mesmerise C wrote: Reason for CRM: Izetta from [employer] advised needing last A1C and when he's supposed to get his next appointment to have it checked she would like a call back or can be faxed to  [phone]

## 2024-03-04 NOTE — Telephone Encounter (Signed)
 The number that was left isn't a working number.

## 2024-03-04 NOTE — Telephone Encounter (Signed)
 Spoke with patient and gave him the information that katie needed. Also got him scheduled for an appointment.

## 2024-03-12 NOTE — Progress Notes (Signed)
 NEUROLOGY FOLLOW UP OFFICE NOTE  Devon Barron [id number]  Assessment/Plan:   Multiple sclerosis Fatigue Right occipital headache, mild, may be cervicogenic/myofascial related to new bed.    DMT:  Rebif  D3 4000 IU daily  Check CBC with diff, LFTs, vit D level, B12 Repeat MRI of brain and cervical spine with and without contrast in 6 months.  If stable, consider discontinuing Rebif  Follow up 6 months.   Subjective:  Devon Barron is a 63 year old right-handed man with history of prostate cancer who follows up for multiple sclerosis.  MRIs personally reviewed.   UPDATE: Current disease modifying therapy: Rebif  (since 2014) Other current medications: D3 4000 IU daily  Last September, eye doctor saw possible early glaucoma in right eye. Left eye recently, dry eye.   Pain in back of head   10/17/2023 MRI BRAIN W WO:  Unchanged signal abnormalities in the periventricular and deep white matter compatible with demyelinating plaques of multiple sclerosis. No new lesions. No associated enhancement. 10/17/2023 MRI C-SPINE W WO:  Unchanged signal abnormality in the cord at the level of C2-C3. No new lesion or abnormal enhancement.  09/10/2023 Labs: CBC with WBC 5.7, HGB 14.3, HCT 43.5, PLT 242, ALC 1146; Hepatic panel with t bili 0.5, ALP 60, AST 19, ALT 24; vit D 49     For the past 3 to 4 weeks, reports increased fatigue.  Also has noticed a headache in the back of his head.  Recently got a new bed.  Vit D and TSH normal.     HISTORY: He was diagnosed with MS in 2001.  At that time, he presented with left facial myokymia. He reports that he hasn't had many flare ups, and only a couple of flare-ups requiring pulse steroids.  They typically present as blurred vision, headache, dizziness, unsteadiness and fatigue.  He has both supratentorial and infratentorial demyelinating lesions.  His cervical cord is okay.  He has a history of lapses with physician follow-ups and non-adherence to  disease-modifying agents, mostly due to financial reasons.  He previously was on Avonex , and then Rebif .  Due to financial reasons, he has not followed up with a neurologist in between 2013 and 2014 and had not taken his Rebif .  He developed increased fatigue, stiffness, blurred vision and intermittent headaches, similar to prior flare ups.  He presented to the ED on 05/03/13.  Physical exam revealed slight right sided facial droop.  Initially, a CT of the head was performed and revealed decreased attenuation at the right subfrontal-anterior temporal junction.  MRI Brain w/wo contrast was performed, and revealed extensive periventricular and subcortical T2 hyperintensities, as well as focal restricted diffusion within the splenium of the corpus callosum, concerning for active demyelination.  He has since restarted Rebif .  Imaging: 02/26/2022 MRI BRAIN W WO:  Stable findings of multiple sclerosis affecting the brainstem, cerebellum and cerebral hemispheres as above. No new or progressive lesions. No lesions show true restricted diffusion or contrast enhancement. Few areas of T2 shine through are stable. 02/26/2022 MRI C-SPINE W WO:  Stable examination since 10/10/2013. Abnormal T2 signal within the cord at C2-3 and C6-7 is stable and does not show any contrast enhancement. No new lesion. 11/18/2020 MRI BRAIN W WO:  1. Findings consistent with chronic advanced multiple sclerosis, overall relatively similar in appearance as compared to most recent brain MRI from 12/02/2019. No evidence for significant disease progression. No active demyelination. 2. No other acute intracranial abnormality. 12/02/2019 MRI BRAIN W WO:  1. Chronically advanced multiple sclerosis with no areas of active demyelination identified. However, multifocal posterior frontal and parietal lobe cortical involvement appears mildly progressed since 2018. 2. No new intracranial abnormality. 3. Evidence of chronic demyelinating plaque in the dorsal  upper spinal cord at C2. 11/03/2016 MRI BRAIN W WO:  No change from the prior study. Findings consistent with multiple sclerosis. 02/14/2015 MRI BRAIN W WO:  Moderate changes of multiple sclerosis. No change from 03/08/2014. No lesion show enhancement or restricted diffusion. 03/08/2014 MRI BRAIN W WO:  No new demyelinating plaque or MR findings of active demyelination  detected as detailed above.  10/10/2013 MRI C-SPINE WO:  1. T2 hyperintense cord lesions at C2-3 and C6-7, consistent with  history of multiple sclerosis.  2. Minimal degenerative disc disease without significant stenosis.  3. Diffuse loss of normal bone marrow signal is nonspecific and can  be seen in both benign and malignant conditions (anemia with red  marrow reactivation, infiltrative/myelofibrotic marrow processes, or metastatic disease).  07/13/2013 MRI BRAIN W WO:  Single new 7 mm left parietal enhancing white matter lesion  consistent with acute demyelination and disease progression in a  background of severe chronic demyelination. No advanced parenchymal brain volume loss for age.  05/03/2013 MRI BRAIN W WO:  1. Extensive periventricular and subcortical T2 hyperintensities in  patterns consistent with the given diagnosis of multiple sclerosis.  2. Focal restricted diffusion within the splenium of the corpus  callosum is concerning for active demyelination.  3. Based on prior CT exams, there is some progression of disease,  certainly since 2006.  4. Decreased marrow signal within the upper cervical spine. This is  nonspecific, as the patient does not appear to be anemic in prior  laboratory testing. This could be related to prior therapy.   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  PAST MEDICAL HISTORY: Past Medical History:  Diagnosis Date   DM type 2 (diabetes mellitus, type 2) (HCC)    Hyperlipidemia    MS (multiple sclerosis) (HCC)    Prostate cancer (HCC)     MEDICATIONS: Current Outpatient  Medications on File Prior to Visit  Medication Sig Dispense Refill   amLODipine  (NORVASC ) 5 MG tablet Take 1 tablet (5 mg total) by mouth daily. 90 tablet 0   cholecalciferol (VITAMIN D ) 1000 UNITS tablet Take 1,000 Units by mouth daily. VIT D 3 2000 UNITS TAKES 2 TABS = 4000 UNITS     Continuous Glucose Receiver (DEXCOM G7 RECEIVER) DEVI 1 Act by Does not apply route daily. 9 each 1   Continuous Glucose Sensor (DEXCOM G7 SENSOR) MISC 1 Act by Does not apply route daily. 9 each 1   indapamide  (LOZOL ) 1.25 MG tablet Take 1 tablet (1.25 mg total) by mouth daily. 90 tablet 0   interferon beta-1a  (REBIF ) 44 MCG/0.5ML SOSY injection INJECT 1 SYRINGE UNDER THE SKIN 3 TIMES PER WEEK 12 mL 6   lidocaine  (LIDODERM ) 5 % Place 1 patch onto the skin daily. Remove & Discard patch within 12 hours or as directed by MD 30 patch 0   methocarbamol  (ROBAXIN ) 500 MG tablet Take 1 tablet (500 mg total) by mouth 2 (two) times daily. 20 tablet 0   metoprolol  tartrate (LOPRESSOR ) 100 MG tablet Take tablet (100mg ) TWO hours prior to your cardiac CT scan. 1 tablet 0   Multiple Vitamins-Minerals (MULTIVITAMIN WITH MINERALS) tablet Take 1 tablet by mouth daily.     rosuvastatin  (CRESTOR ) 20 MG tablet Take 1  tablet (20 mg total) by mouth daily. 90 tablet 0   tamsulosin  (FLOMAX ) 0.4 MG CAPS capsule Take 0.4 mg by mouth daily.     No current facility-administered medications on file prior to visit.    ALLERGIES: No Known Allergies  FAMILY HISTORY: Family History  Problem Relation Age of Onset   Hypertension Mother    Ovarian cancer Mother    Hypertension Father    Prostate cancer Father    Diabetes Sister    Diabetes Brother    Ataxia Neg Hx    Chorea Neg Hx    Dementia Neg Hx    Mental retardation Neg Hx    Migraines Neg Hx    Multiple sclerosis Neg Hx    Neurofibromatosis Neg Hx    Neuropathy Neg Hx    Parkinsonism Neg Hx    Seizures Neg Hx    Stroke Neg Hx    Early death Neg Hx    Cancer Neg Hx     Alcohol abuse Neg Hx    Hearing loss Neg Hx    Heart disease Neg Hx    Hyperlipidemia Neg Hx    Kidney disease Neg Hx    Breast cancer Neg Hx    Colon cancer Neg Hx    Pancreatic cancer Neg Hx    Rectal cancer Neg Hx    Stomach cancer Neg Hx    Esophageal cancer Neg Hx       Objective:  Blood pressure 136/86, pulse (!) 101, height 5' 9 (1.753 m), weight 205 lb (93 kg), SpO2 98%. General: No acute distress.  Patient appears well-groomed.   Head:  Normocephalic/atraumatic, right suboccipital tenderness Neck:  Supple.  No paraspinal tenderness.  Full range of motion. Heart:  Regular rate and rhythm. Neuro:  Alert and oriented.  Speech fluent and not dysarthric.  Language intact.  CN II-XII intact.  Bulk and tone normal.  Muscle strength 5/5 throughout.  Sensation to light touch intact.  Deep tendon reflexes 2+ throughout, toes downgoing.  Gait normal.  Romberg negative.     Juliene Dunnings, DO  CC: Debby Molt, MD

## 2024-03-15 ENCOUNTER — Encounter: Payer: Self-pay | Admitting: Neurology

## 2024-03-15 ENCOUNTER — Other Ambulatory Visit

## 2024-03-15 ENCOUNTER — Ambulatory Visit (INDEPENDENT_AMBULATORY_CARE_PROVIDER_SITE_OTHER): Payer: BC Managed Care – PPO | Admitting: Neurology

## 2024-03-15 VITALS — BP 136/86 | HR 101 | Ht 69.0 in | Wt 205.0 lb

## 2024-03-15 DIAGNOSIS — G35 Multiple sclerosis: Secondary | ICD-10-CM | POA: Diagnosis not present

## 2024-03-15 DIAGNOSIS — R519 Headache, unspecified: Secondary | ICD-10-CM

## 2024-03-15 DIAGNOSIS — R5383 Other fatigue: Secondary | ICD-10-CM

## 2024-03-15 LAB — CBC WITH DIFFERENTIAL/PLATELET
Absolute Lymphocytes: 998 {cells}/uL (ref 850–3900)
Absolute Monocytes: 845 {cells}/uL (ref 200–950)
Basophils Absolute: 51 {cells}/uL (ref 0–200)
Basophils Relative: 0.8 %
Eosinophils Absolute: 122 {cells}/uL (ref 15–500)
Eosinophils Relative: 1.9 %
HCT: 44.7 % (ref 38.5–50.0)
Hemoglobin: 14.2 g/dL (ref 13.2–17.1)
MCH: 28.1 pg (ref 27.0–33.0)
MCHC: 31.8 g/dL — ABNORMAL LOW (ref 32.0–36.0)
MCV: 88.3 fL (ref 80.0–100.0)
MPV: 11.3 fL (ref 7.5–12.5)
Monocytes Relative: 13.2 %
Neutro Abs: 4384 {cells}/uL (ref 1500–7800)
Neutrophils Relative %: 68.5 %
Platelets: 241 Thousand/uL (ref 140–400)
RBC: 5.06 Million/uL (ref 4.20–5.80)
RDW: 13.4 % (ref 11.0–15.0)
Total Lymphocyte: 15.6 %
WBC: 6.4 Thousand/uL (ref 3.8–10.8)

## 2024-03-15 LAB — COMPREHENSIVE METABOLIC PANEL WITH GFR
AG Ratio: 1.4 (calc) (ref 1.0–2.5)
ALT: 23 U/L (ref 9–46)
AST: 18 U/L (ref 10–35)
Albumin: 4.5 g/dL (ref 3.6–5.1)
Alkaline phosphatase (APISO): 59 U/L (ref 35–144)
BUN: 8 mg/dL (ref 7–25)
CO2: 29 mmol/L (ref 20–32)
Calcium: 9.5 mg/dL (ref 8.6–10.3)
Chloride: 101 mmol/L (ref 98–110)
Creat: 1.03 mg/dL (ref 0.70–1.35)
Globulin: 3.2 g/dL (ref 1.9–3.7)
Glucose, Bld: 98 mg/dL (ref 65–99)
Potassium: 4.4 mmol/L (ref 3.5–5.3)
Sodium: 137 mmol/L (ref 135–146)
Total Bilirubin: 0.4 mg/dL (ref 0.2–1.2)
Total Protein: 7.7 g/dL (ref 6.1–8.1)
eGFR: 82 mL/min/1.73m2 (ref >=60)

## 2024-03-15 LAB — VITAMIN D 25 HYDROXY (VIT D DEFICIENCY, FRACTURES): Vit D, 25-Hydroxy: 57 ng/mL (ref 30–100)

## 2024-03-15 MED ORDER — REBIF 44 MCG/0.5ML ~~LOC~~ SOSY: PREFILLED_SYRINGE | SUBCUTANEOUS | 6 refills | Status: DC

## 2024-03-15 NOTE — Patient Instructions (Addendum)
 Continue Rebif .  Next visit, we will consider discontinuing it (pending MRI results) Continue vit D 4000 international units daily Check CBC, CMP, vit d and B12 today Your provider has requested that you have labwork completed today. Please go to [facility] ([id number]) on the second floor of this building before leaving the office today. You do not need to check in. If you are not called within 15 minutes please check with the front desk.   Check MRI of brain and cervical spine with and without contrast in SIX MONTHS. We have sent a referral to [facility] for your MRI and they will call you directly to schedule your appointment. They are located at [address] [facility]. If you need to contact them directly please call [phone].

## 2024-03-16 ENCOUNTER — Ambulatory Visit: Payer: Self-pay | Admitting: Neurology

## 2024-03-16 NOTE — Progress Notes (Signed)
 Patient advised.

## 2024-03-22 ENCOUNTER — Ambulatory Visit: Admitting: Internal Medicine

## 2024-03-31 ENCOUNTER — Telehealth: Payer: Self-pay

## 2024-03-31 NOTE — Telephone Encounter (Signed)
 Copied from CRM #[id number]. Topic: General - Other >> Mar 30, 2024  4:15 PM Rosina BIRCH wrote: Reason for CRM: izetta from [employer] called stating she need updated lab work for the patient [phone]

## 2024-03-31 NOTE — Telephone Encounter (Signed)
 Unable to reach patient. LMTRC

## 2024-03-31 NOTE — Telephone Encounter (Signed)
 Spoke with Devon Barron and advised her that we haven't seen this patient since may and she has the labs for that his next appointment isn't until 04/29/2024. She gave a verbal understanding.

## 2024-04-29 ENCOUNTER — Ambulatory Visit: Admitting: Internal Medicine

## 2024-04-29 ENCOUNTER — Ambulatory Visit: Payer: Self-pay | Admitting: Internal Medicine

## 2024-04-29 ENCOUNTER — Encounter: Payer: Self-pay | Admitting: Internal Medicine

## 2024-04-29 VITALS — BP 138/88 | HR 93 | Temp 98.3°F | Resp 16 | Ht 69.0 in | Wt 202.8 lb

## 2024-04-29 DIAGNOSIS — I10 Essential (primary) hypertension: Secondary | ICD-10-CM | POA: Diagnosis not present

## 2024-04-29 DIAGNOSIS — E118 Type 2 diabetes mellitus with unspecified complications: Secondary | ICD-10-CM

## 2024-04-29 DIAGNOSIS — E785 Hyperlipidemia, unspecified: Secondary | ICD-10-CM | POA: Diagnosis not present

## 2024-04-29 DIAGNOSIS — E11649 Type 2 diabetes mellitus with hypoglycemia without coma: Secondary | ICD-10-CM | POA: Diagnosis not present

## 2024-04-29 DIAGNOSIS — Z23 Encounter for immunization: Secondary | ICD-10-CM

## 2024-04-29 LAB — CK: Total CK: 182 U/L (ref 17–232)

## 2024-04-29 LAB — HEMOGLOBIN A1C: Hgb A1c MFr Bld: 6.7 % — ABNORMAL HIGH (ref 4.6–6.5)

## 2024-04-29 MED ORDER — INDAPAMIDE 1.25 MG PO TABS: 1.2500 mg | ORAL_TABLET | Freq: Every day | ORAL | 0 refills | Status: AC

## 2024-04-29 MED ORDER — ROSUVASTATIN CALCIUM 20 MG PO TABS: 20.0000 mg | ORAL_TABLET | Freq: Every day | ORAL | 0 refills | Status: DC

## 2024-04-29 MED ORDER — ROSUVASTATIN CALCIUM 20 MG PO TABS: 20.0000 mg | ORAL_TABLET | Freq: Every day | ORAL | 1 refills | Status: AC

## 2024-04-29 MED ORDER — DEXCOM G7 SENSOR MISC: 1.0000 | Freq: Every day | 1 refills | Status: DC

## 2024-04-29 MED ORDER — AMLODIPINE BESYLATE 5 MG PO TABS: 5.0000 mg | ORAL_TABLET | Freq: Every day | ORAL | 1 refills | Status: AC

## 2024-04-29 MED ORDER — DEXCOM G7 RECEIVER DEVI: 1.0000 | Freq: Every day | 1 refills | Status: AC

## 2024-04-29 NOTE — Patient Instructions (Signed)

## 2024-04-29 NOTE — Progress Notes (Addendum)
 "  Subjective:  Patient ID: Devon Barron, male    DOB: 1961-04-14  Age: 63 y.o. MRN: [id number]  CC: Diabetes (A1c check ), Hypertension, and Hyperlipidemia   HPI Devon Barron presents for f/up ----  Discussed the use of AI scribe software for clinical note transcription with the patient, who gave verbal consent to proceed.  History of Present Illness Devon Barron is a 63 year old male with diabetes and hypertension who presents for follow-up on blood sugar and blood pressure management.  He reports using a Dexcom continuous glucose monitor for about three months. His average blood sugar is around 112 mg/dL. He has made positive lifestyle changes, including reducing Coca [employer]a intake and working on weight loss. Blood sugar is usually under 100 mg/dL in the mornings and around 98 mg/dL by the end of his work shift. No symptoms of high or low blood sugar, although the monitor sometimes indicates low blood sugar when he changes it, prompting him to consume candy or a drink. He does not use insulin.  He reports fluctuating blood pressure readings, with some days being higher than others. A recent reading was 106/81 mmHg and another was 147/unknown mmHg after a busy day at work. No symptoms associated with blood pressure fluctuations.  He has a history of prostate cancer and mentions that his last PSA test was six to seven months ago and was normal. He had a disagreement with a previous doctor regarding time off work during his cancer treatment.  He has multiple sclerosis and is currently on Rebif . He reports no recent MS symptoms.  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 He mentions that the work has become harder after a recent raise.     Outpatient Medications Prior to Visit  Medication Sig Dispense Refill   cholecalciferol (VITAMIN D ) 1000 UNITS tablet Take 1,000 Units by mouth daily. VIT D 3 2000 UNITS TAKES 2  TABS = 4000 UNITS     interferon beta-1a  (REBIF ) 44 MCG/0.5ML SOSY injection INJECT 1 SYRINGE UNDER THE SKIN 3 TIMES PER WEEK 12 mL 6   Multiple Vitamins-Minerals (MULTIVITAMIN WITH MINERALS) tablet Take 1 tablet by mouth daily.     amLODipine  (NORVASC ) 5 MG tablet Take 1 tablet (5 mg total) by mouth daily. 90 tablet 0   Continuous Glucose Receiver (DEXCOM G7 RECEIVER) DEVI 1 Act by Does not apply route daily. 9 each 1   Continuous Glucose Sensor (DEXCOM G7 SENSOR) MISC 1 Act by Does not apply route daily. 9 each 1   indapamide  (LOZOL ) 1.25 MG tablet Take 1 tablet (1.25 mg total) by mouth daily. 90 tablet 0   lidocaine  (LIDODERM ) 5 % Place 1 patch onto the skin daily. Remove & Discard patch within 12 hours or as directed by MD (Patient not taking: Reported on 04/29/2024) 30 patch 0   methocarbamol  (ROBAXIN ) 500 MG tablet Take 1 tablet (500 mg total) by mouth 2 (two) times daily. (Patient not taking: Reported on 04/29/2024) 20 tablet 0   metoprolol  tartrate (LOPRESSOR ) 100 MG tablet Take tablet (100mg ) TWO hours prior to your cardiac CT scan. (Patient not taking: Reported on 04/29/2024) 1 tablet 0   rosuvastatin  (CRESTOR ) 20 MG tablet Take 1 tablet (20 mg total) by mouth daily. (Patient not taking: Reported on 04/29/2024) 90 tablet 0   No facility-administered medications prior to visit.    ROS Review of Systems  Constitutional:  Negative for appetite  change, chills, diaphoresis, fatigue and fever.  HENT: Negative.    Eyes: Negative.   Respiratory:  Negative for cough, chest tightness, shortness of breath and wheezing.   Cardiovascular:  Negative for chest pain, palpitations and leg swelling.  Gastrointestinal:  Negative for abdominal pain, constipation, diarrhea, nausea and vomiting.  Endocrine: Negative.  Negative for polydipsia, polyphagia and polyuria.  Genitourinary: Negative.  Negative for difficulty urinating.  Musculoskeletal: Negative.  Negative for arthralgias and myalgias.  Skin:  Negative.   Neurological:  Positive for weakness. Negative for dizziness and speech difficulty.  Hematological:  Negative for adenopathy. Does not bruise/bleed easily.  Psychiatric/Behavioral:  Negative for confusion and decreased concentration.     Objective:  BP 138/88 (BP Location: Left Arm, Patient Position: Sitting, Cuff Size: Normal)   Pulse 93   Temp 98.3 F (36.8 C) (Oral)   Resp 16   Ht 5' 9 (1.753 m)   Wt 202 lb 12.8 oz (92 kg)   SpO2 96%   BMI 29.95 kg/m   BP Readings from Last 3 Encounters:  04/29/24 138/88  03/15/24 136/86  01/09/24 127/86    Wt Readings from Last 3 Encounters:  04/29/24 202 lb 12.8 oz (92 kg)  03/15/24 205 lb (93 kg)  12/17/23 215 lb 3.2 oz (97.6 kg)    Physical Exam Vitals reviewed.  Constitutional:      Appearance: Normal appearance.  HENT:     Nose: Nose normal.     Mouth/Throat:     Mouth: Mucous membranes are moist.  Eyes:     General: No scleral icterus.    Conjunctiva/sclera: Conjunctivae normal.  Cardiovascular:     Rate and Rhythm: Normal rate and regular rhythm.     Heart sounds: No murmur heard.    No friction rub. No gallop.  Pulmonary:     Effort: Pulmonary effort is normal.     Breath sounds: No stridor. No wheezing, rhonchi or rales.  Abdominal:     General: Abdomen is flat.     Palpations: There is no mass.     Tenderness: There is no abdominal tenderness. There is no guarding.     Hernia: No hernia is present.  Musculoskeletal:        General: Normal range of motion.     Cervical back: Neck supple.     Right lower leg: No edema.     Left lower leg: No edema.  Lymphadenopathy:     Cervical: No cervical adenopathy.  Skin:    General: Skin is warm and dry.  Neurological:     Mental Status: He is alert. Mental status is at baseline.  Psychiatric:        Mood and Affect: Mood normal.        Behavior: Behavior normal.     Lab Results  Component Value Date   WBC 6.4 03/15/2024   HGB 14.2 03/15/2024    HCT 44.7 03/15/2024   PLT 241 03/15/2024   GLUCOSE 98 03/15/2024   CHOL 222 (H) 12/17/2023   TRIG 114.0 12/17/2023   HDL 56.20 12/17/2023   LDLDIRECT 102.0 04/17/2022   LDLCALC 143 (H) 12/17/2023   ALT 23 03/15/2024   AST 18 03/15/2024   NA 137 03/15/2024   K 4.4 03/15/2024   CL 101 03/15/2024   CREATININE 1.03 03/15/2024   BUN 8 03/15/2024   CO2 29 03/15/2024   TSH 1.43 12/17/2023   PSA 0.03 07/22/2023   PSA 0.03 07/22/2023   INR 1.0 12/04/2020  HGBA1C 6.7 (H) 04/29/2024   MICROALBUR 5.7 (H) 12/17/2023    CT CORONARY MORPH W/CTA COR W/SCORE W/CA W/CM &/OR WO/CM Addendum Date: 01/12/2024 ADDENDUM REPORT: 01/12/2024 09:50 EXAM: OVER-READ INTERPRETATION  CT CHEST The following report is an over-read performed by radiologist Dr. Suzen Dials of [facility] on 01/12/2024. This over-read does not include interpretation of cardiac or coronary anatomy or pathology. The coronary calcium  score/coronary CTA interpretation by the cardiologist is attached. COMPARISON:  June 21, 2020 FINDINGS: Cardiovascular: There are no significant extracardiac vascular findings. Mediastinum/Nodes: There are no enlarged lymph nodes within the visualized mediastinum. Lungs/Pleura: There is no pleural effusion. A 3 mm lingular calcified lung nodule is seen. Upper abdomen: There is a small hiatal hernia. Musculoskeletal/Chest wall: No chest wall mass or suspicious osseous findings within the visualized chest. IMPRESSION: Small hiatal hernia. Electronically Signed   By: Suzen Dials M.D.   On: 01/12/2024 09:50   Result Date: 01/12/2024 HISTORY: Chest pain/anginal equiv, high CAD risk, not treadmill candidate EXAM: Cardiac/Coronary  CT PROTOCOL: A non-contrast, gated CT scan was obtained with axial slices of 2.5 mm through the heart for calcium  scoring. Calcium  scoring was performed using the Agatston method. A 120 kV prospective, gated, contrast cardiac CT scan was obtained. Gantry rotation speed  was 230 msec and collimation was 0.63 mm. Two sublingual nitroglycerin  tablets (0.8 mg) were given. The 3D data set was reconstructed with motion correction for the best systolic or diastolic phase. Images were analyzed on a dedicated workstation using MPR, MIP, and VRT modes. The patient received 95 cc of contrast. FINDINGS: Image quality: Excellent. Artifact: Limited. Coronary calcium  score is 0. Coronary arteries: Normal coronary origins.  Co-dominance. Left Main Coronary Artery: Normal caliber vessel, originates from the left coronary cusp, bifurcates to form a left anterior descending artery (LAD) and a left circumflex artery (LCX). Left main is patent. Left Anterior Descending Artery: Normal caliber vessel, wraps the apex, gives off 1 diagonal branches. LAD is patent. First Diagonal branch: Normal caliber, large size, patent. Left Circumflex Artery: Normal caliber vessel, travels within the atrioventricular groove, gives off 3 obtuse marginal branches. LCX is patent. First Obtuse Marginal branch: Normal caliber, large size, high take off, patent. Second Obtuse Marginal branch: Patent Third Obtuse Marginal branch: Patent Right Coronary Artery: Co-dominant vessel, originates from the right coronary cusp, RCA is patent. Plaque Analysis: Calcified plaque 0 mm3 Non-calcified plaque 5 mm3 (low attenuation plaque 0 mm3). Total plaque volume (TPV) is 5 mm3, categorized as mild, which is 5th percentile for age- and sex-matched controls. Aorta: Normal size, 33 mm at the mid ascending aorta (level of the PA bifurcation) measured double oblique. Aortic Valve: Native valve, trileaflet aortic valve, no significant calcification. Mitral valve: Native valve, no significant mitral annular calcification. Other findings: Normal pulmonary vein drainage into the left atrium. Normal left atrial appendage without thrombus. Normal size of the pulmonary artery. Please see separate report from [facility] for non-cardiac  findings. IMPRESSION: 1. Coronary calcium  score of 0. 2. Total plaque volume (TPV) is 5 mm3, categorized as mild, which is 5th percentile for age- and sex-matched controls. 3. Normal coronary origins with co-dominance. 4. CAD-RADS 0.  No evidence of CAD. RECOMMENDATION: Consider non-atherosclerotic causes of chest pain. Electronically Signed: By: Madonna Large On: 01/11/2024 17:11    Assessment & Plan:  Essential hypertension- BP is well controlled. -     amLODIPine  Besylate; Take 1 tablet (5 mg total) by mouth daily.  Dispense: 90 tablet; Refill: 1 -  Indapamide ; Take 1 tablet (1.25 mg total) by mouth daily.  Dispense: 90 tablet; Refill: 0  Type II diabetes mellitus with manifestations (HCC)- Blood sugar is well controlled. -     Dexcom G7 Receiver; 1 Act by Does not apply route daily.  Dispense: 9 each; Refill: 1 -     Dexcom G7 Sensor; 1 Act by Does not apply route daily.  Dispense: 9 each; Refill: 1 -     Hemoglobin A1c; Future  Hypoglycemia associated with diabetes (HCC) -     Dexcom G7 Receiver; 1 Act by Does not apply route daily.  Dispense: 9 each; Refill: 1 -     Dexcom G7 Sensor; 1 Act by Does not apply route daily.  Dispense: 9 each; Refill: 1  Hyperlipidemia LDL goal <100- LDL goal achieved. Doing well on the statin  -     Rosuvastatin  Calcium ; Take 1 tablet (20 mg total) by mouth daily.  Dispense: 90 tablet; Refill: 1 -     CK; Future     Follow-up: Return in about 6 months (around 10/27/2024).  Debby Molt, MD "

## 2024-04-30 ENCOUNTER — Telehealth: Payer: Self-pay | Admitting: Radiology

## 2024-04-30 NOTE — Telephone Encounter (Signed)
 Copied from CRM #[id number]. Topic: General - Other >> Apr 30, 2024  3:07 PM Burnard DEL wrote: Reason for CRM: atie from [employer] advised needing last A1C from appointment on 9/252025. She also wanted to know if provider sent in refill rx for dexcom for patient since provider would like for him to continue wearing for 4to6 months.

## 2024-05-03 NOTE — Telephone Encounter (Signed)
 Unable to reach care team benefits because I do not have a contact number. Will close the call.

## 2024-05-06 ENCOUNTER — Other Ambulatory Visit: Payer: Self-pay | Admitting: Internal Medicine

## 2024-05-06 ENCOUNTER — Encounter: Payer: Self-pay | Admitting: Internal Medicine

## 2024-05-06 ENCOUNTER — Telehealth: Payer: Self-pay

## 2024-05-06 DIAGNOSIS — E118 Type 2 diabetes mellitus with unspecified complications: Secondary | ICD-10-CM

## 2024-05-06 DIAGNOSIS — E11649 Type 2 diabetes mellitus with hypoglycemia without coma: Secondary | ICD-10-CM

## 2024-05-06 NOTE — Telephone Encounter (Unsigned)
 Copied from CRM [id number]. Topic: Clinical - Medication Refill >> May 06, 2024  4:25 PM Thersia C wrote: Medication: Continuous Glucose Sensor (DEXCOM G7 SENSOR) MISC  Has the patient contacted their pharmacy? Yes (Agent: If no, request that the patient contact the pharmacy for the refill. If patient does not wish to contact the pharmacy document the reason why and proceed with request.) (Agent: If yes, when and what did the pharmacy advise?)  This is the patient's preferred pharmacy:  [facility] [address], [place] - [address] AT [address] [place] [address] Phone: [phone] Fax: [phone]   Is this the correct pharmacy for this prescription? Yes If no, delete pharmacy and type the correct one.   Has the prescription been filled recently? No  Is the patient out of the medication? Yes  Has the patient been seen for an appointment in the last year OR does the patient have an upcoming appointment? Yes  Can we respond through MyChart? Yes  Agent: Please be advised that Rx refills may take up to 3 business days. We ask that you follow-up with your pharmacy.

## 2024-05-06 NOTE — Telephone Encounter (Signed)
 Copied from CRM [id number]. Topic: Clinical - Medication Question >> May 06, 2024  4:04 PM Drema MATSU wrote: Reason for CRM: Izetta wanted to let pcp know about discrepancies for the medications prescribed. She stated that patient isn't taking Crestor . Patient SAID has taken Crestor  in the past and it made him feel fatigued and groggy. He is taking Lozole and Norvasc  every other day.

## 2024-05-07 NOTE — Telephone Encounter (Signed)
 FYI

## 2024-06-07 ENCOUNTER — Encounter: Payer: Self-pay | Admitting: Radiology

## 2024-06-28 ENCOUNTER — Other Ambulatory Visit: Payer: Self-pay | Admitting: Neurology

## 2024-07-04 ENCOUNTER — Other Ambulatory Visit: Payer: Self-pay | Admitting: Internal Medicine

## 2024-07-04 DIAGNOSIS — E118 Type 2 diabetes mellitus with unspecified complications: Secondary | ICD-10-CM

## 2024-07-04 DIAGNOSIS — E11649 Type 2 diabetes mellitus with hypoglycemia without coma: Secondary | ICD-10-CM

## 2024-08-18 ENCOUNTER — Telehealth: Payer: Self-pay | Admitting: Pharmacy Technician

## 2024-08-18 ENCOUNTER — Telehealth: Payer: Self-pay

## 2024-08-18 ENCOUNTER — Other Ambulatory Visit (HOSPITAL_COMMUNITY): Payer: Self-pay

## 2024-08-18 NOTE — Telephone Encounter (Signed)
 Pharmacy Patient Advocate Encounter   Received notification from Pt Calls Messages that prior authorization for REBIF  44 is required/requested.   Insurance verification completed.   The patient is insured through [facility].   Per test claim: PA required; PA submitted to above mentioned insurance via Latent Key/confirmation #/[facility] Status is pending

## 2024-08-18 NOTE — Telephone Encounter (Signed)
 PA has been submitted, and telephone encounter has been created. Please see telephone encounter dated 1.14.26.

## 2024-08-18 NOTE — Telephone Encounter (Signed)
 Letter received from [facility] need a PA for Rebif .    PA team please start a PA.

## 2024-09-10 ENCOUNTER — Other Ambulatory Visit

## 2024-09-10 DIAGNOSIS — G35D Multiple sclerosis, unspecified: Secondary | ICD-10-CM

## 2024-09-10 MED ORDER — GADOPICLENOL 0.5 MMOL/ML IV SOLN
9.0000 mL | Freq: Once | INTRAVENOUS | Status: AC | PRN
  Administered 2024-09-10: 9 mL via INTRAVENOUS

## 2024-09-16 ENCOUNTER — Ambulatory Visit: Admitting: Neurology
